# Patient Record
Sex: Male | Born: 1945 | Race: White | Hispanic: No | Marital: Married | Smoking: Never smoker
Health system: Southern US, Community
[De-identification: ages and names within clinical notes are randomized; demographics above are authoritative.]

## PROBLEM LIST (undated history)

## (undated) DIAGNOSIS — J302 Other seasonal allergic rhinitis: Secondary | ICD-10-CM

## (undated) DIAGNOSIS — K219 Gastro-esophageal reflux disease without esophagitis: Secondary | ICD-10-CM

## (undated) DIAGNOSIS — N400 Enlarged prostate without lower urinary tract symptoms: Secondary | ICD-10-CM

## (undated) DIAGNOSIS — D649 Anemia, unspecified: Secondary | ICD-10-CM

## (undated) DIAGNOSIS — Z9889 Other specified postprocedural states: Secondary | ICD-10-CM

## (undated) DIAGNOSIS — C4490 Unspecified malignant neoplasm of skin, unspecified: Secondary | ICD-10-CM

## (undated) DIAGNOSIS — Z86010 Personal history of colonic polyps: Principal | ICD-10-CM

## (undated) HISTORY — PX: BASAL CELL CARCINOMA EXCISION: SHX1214

## (undated) HISTORY — PX: COLONOSCOPY: SHX174

## (undated) HISTORY — DX: Gastro-esophageal reflux disease without esophagitis: K21.9

## (undated) HISTORY — DX: Unspecified malignant neoplasm of skin, unspecified: C44.90

## (undated) HISTORY — DX: Other specified postprocedural states: Z98.890

## (undated) HISTORY — DX: Other seasonal allergic rhinitis: J30.2

## (undated) HISTORY — DX: Personal history of colonic polyps: Z86.010

---

## 1992-05-23 DIAGNOSIS — C4491 Basal cell carcinoma of skin, unspecified: Secondary | ICD-10-CM

## 1992-05-23 HISTORY — DX: Basal cell carcinoma of skin, unspecified: C44.91

## 1993-03-26 HISTORY — PX: OTHER SURGICAL HISTORY: SHX169

## 1993-11-22 DIAGNOSIS — C4492 Squamous cell carcinoma of skin, unspecified: Secondary | ICD-10-CM

## 1993-11-22 DIAGNOSIS — C4491 Basal cell carcinoma of skin, unspecified: Secondary | ICD-10-CM

## 1993-11-22 HISTORY — DX: Squamous cell carcinoma of skin, unspecified: C44.92

## 1993-11-22 HISTORY — DX: Basal cell carcinoma of skin, unspecified: C44.91

## 2002-03-26 DIAGNOSIS — Z9889 Other specified postprocedural states: Secondary | ICD-10-CM

## 2002-03-26 HISTORY — DX: Other specified postprocedural states: Z98.890

## 2003-02-26 ENCOUNTER — Encounter: Payer: Self-pay | Admitting: Emergency Medicine

## 2003-02-27 ENCOUNTER — Observation Stay (HOSPITAL_COMMUNITY): Admission: AD | Admit: 2003-02-27 | Discharge: 2003-02-28 | Payer: Self-pay | Admitting: Cardiology

## 2003-02-27 HISTORY — PX: CARDIAC CATHETERIZATION: SHX172

## 2003-03-09 DIAGNOSIS — C4492 Squamous cell carcinoma of skin, unspecified: Secondary | ICD-10-CM

## 2003-03-09 HISTORY — DX: Squamous cell carcinoma of skin, unspecified: C44.92

## 2003-03-12 ENCOUNTER — Ambulatory Visit (HOSPITAL_COMMUNITY): Admission: RE | Admit: 2003-03-12 | Discharge: 2003-03-12 | Payer: Self-pay | Admitting: Gastroenterology

## 2003-04-07 ENCOUNTER — Ambulatory Visit (HOSPITAL_COMMUNITY): Admission: RE | Admit: 2003-04-07 | Discharge: 2003-04-07 | Payer: Self-pay | Admitting: Cardiology

## 2012-11-26 ENCOUNTER — Encounter: Payer: Self-pay | Admitting: Internal Medicine

## 2012-11-26 ENCOUNTER — Ambulatory Visit (INDEPENDENT_AMBULATORY_CARE_PROVIDER_SITE_OTHER): Payer: 59 | Admitting: Internal Medicine

## 2012-11-26 VITALS — BP 116/80 | HR 63 | Temp 98.4°F | Resp 16 | Ht 67.5 in | Wt 165.6 lb

## 2012-11-26 DIAGNOSIS — Z8249 Family history of ischemic heart disease and other diseases of the circulatory system: Secondary | ICD-10-CM

## 2012-11-26 DIAGNOSIS — E785 Hyperlipidemia, unspecified: Secondary | ICD-10-CM

## 2012-11-26 DIAGNOSIS — Z0000 Encounter for general adult medical examination without abnormal findings: Secondary | ICD-10-CM

## 2012-11-26 DIAGNOSIS — Z23 Encounter for immunization: Secondary | ICD-10-CM

## 2012-11-26 DIAGNOSIS — J45909 Unspecified asthma, uncomplicated: Secondary | ICD-10-CM | POA: Insufficient documentation

## 2012-11-26 LAB — CBC
Hemoglobin: 14.1 g/dL (ref 13.0–17.0)
MCH: 26 pg (ref 26.0–34.0)
Platelets: 276 10*3/uL (ref 150–400)
RBC: 5.43 MIL/uL (ref 4.22–5.81)
WBC: 4.2 10*3/uL (ref 4.0–10.5)

## 2012-11-26 LAB — POCT URINALYSIS DIPSTICK
Bilirubin, UA: NEGATIVE
Blood, UA: NEGATIVE
Leukocytes, UA: NEGATIVE
Protein, UA: NEGATIVE
Spec Grav, UA: 1.015
pH, UA: 5.5

## 2012-11-26 LAB — COMPREHENSIVE METABOLIC PANEL
ALT: 15 U/L (ref 0–53)
Albumin: 4.1 g/dL (ref 3.5–5.2)
CO2: 29 mEq/L (ref 19–32)
Calcium: 9 mg/dL (ref 8.4–10.5)
Chloride: 105 mEq/L (ref 96–112)
Glucose, Bld: 88 mg/dL (ref 70–99)
Sodium: 139 mEq/L (ref 135–145)
Total Bilirubin: 0.5 mg/dL (ref 0.3–1.2)
Total Protein: 6.5 g/dL (ref 6.0–8.3)

## 2012-11-26 LAB — POCT UA - MICROSCOPIC ONLY
Crystals, Ur, HPF, POC: NEGATIVE
Mucus, UA: POSITIVE

## 2012-11-26 LAB — LIPID PANEL
Cholesterol: 207 mg/dL — ABNORMAL HIGH (ref 0–200)
Total CHOL/HDL Ratio: 2.4 Ratio

## 2012-11-26 MED ORDER — AZITHROMYCIN 250 MG PO TABS: ORAL_TABLET | ORAL | Status: DC

## 2012-11-26 MED ORDER — FLUTICASONE-SALMETEROL 100-50 MCG/DOSE IN AEPB: 1.0000 | INHALATION_SPRAY | Freq: Two times a day (BID) | RESPIRATORY_TRACT | Status: DC

## 2012-11-26 MED ORDER — ALBUTEROL SULFATE (2.5 MG/3ML) 0.083% IN NEBU: 2.5000 mg | INHALATION_SOLUTION | Freq: Four times a day (QID) | RESPIRATORY_TRACT | Status: DC | PRN

## 2012-11-26 MED ORDER — FLUTICASONE PROPIONATE 50 MCG/ACT NA SUSP: 2.0000 | Freq: Every day | NASAL | Status: DC

## 2012-11-26 MED ORDER — TRIAZOLAM 0.125 MG PO TABS: 0.1250 mg | ORAL_TABLET | Freq: Every evening | ORAL | Status: DC | PRN

## 2012-11-26 NOTE — Progress Notes (Signed)
Subjective:    Patient ID: Jonathon Taylor, male    DOB: 1945/09/26, 67 y.o.   MRN: [id number]  HPIcpe Doing well No recent meds To [employer] for semester and needs meds to take in case Son 2nd yr colcoll Colon 2004-[facility] Needs Pvax and Zvax and flu ? Small abd wall hern contr with pressure 2-3 yr See w/u hematuria [place] 2012  Review of Systems  Constitutional: Negative.   HENT: Negative.   Eyes: Negative.   Respiratory: Negative.   Cardiovascular: Negative.   Gastrointestinal: Negative.   Endocrine: Negative.   Genitourinary: Negative.   Musculoskeletal: Negative.   Skin: Negative.   Allergic/Immunologic: Negative.   Neurological: Negative.   Hematological: Negative.   Psychiatric/Behavioral: Negative.        Objective:   Physical Exam  Constitutional: He is oriented to person, place, and time. He appears well-developed and well-nourished.  HENT:  Head: Normocephalic.  Right Ear: External ear normal.  Left Ear: External ear normal.  Nose: Nose normal.  Mouth/Throat: Oropharynx is clear and moist.  Tms and canals clear  Eyes: Conjunctivae and EOM are normal. Pupils are equal, round, and reactive to light.  Neck: Normal range of motion. Neck supple. No thyromegaly present.  Cardiovascular: Normal rate, regular rhythm, normal heart sounds and intact distal pulses.   No murmur heard. Pulmonary/Chest: Effort normal and breath sounds normal. No respiratory distress. He has no wheezes. He has no rales.  Abdominal: Soft. Bowel sounds are normal. He exhibits no distension and no mass. There is no tenderness. There is no rebound and no guarding.  No hepatosplenomegaly Small defect LLQ muscle wall w/out herniation  Genitourinary: Rectum normal, prostate normal and penis normal.  Musculoskeletal: Normal range of motion. He exhibits no edema and no tenderness.  Lymphadenopathy:    He has no cervical adenopathy.  Neurological: He is alert and oriented to person, place,  and time. He has normal reflexes. No cranial nerve deficit. He exhibits normal muscle tone. Coordination normal.  Skin: Skin is warm and dry. No rash noted.  Psychiatric: He has a normal mood and affect. His behavior is normal. Judgment and thought content normal.   BP 116/80  Pulse 63  Temp(Src) 98.4 F (36.9 C) (Oral)  Resp 16  Ht 5' 7.5" (1.715 m)  Wt 165 lb 9.6 oz (75.116 kg)  BMI 25.54 kg/m2  SpO2 98%      Assessment & Plan:  Healthy cpe Need for prophylactic vaccination and inoculation against influenza - Plan: Flu Vaccine QUAD 36+ mos IM  Need for prophylactic vaccination against Streptococcus pneumoniae (pneumococcus) - Plan: Pneumococcal polysaccharide vaccine 23-valent greater than or equal to 2yo subcutaneous/IM  Routine general medical examination at a health care facility - Plan: IFOBT POC (occult bld, rslt in office), CBC, Comprehensive metabolic panel, PSA, Lipid panel, POCT UA - Microscopic Only, POCT urinalysis dipstick  Mild hyperlipidemia  Family history of cardiovascular disease  Meds ordered this encounter  Medications  . OVER THE COUNTER MEDICATION    Sig: OTC Ibuprofen prn  . azithromycin (ZITHROMAX) 250 MG tablet    Sig: As packaged    Dispense:  6 tablet    Refill:  0  . azithromycin (ZITHROMAX) 250 MG tablet    Sig: As packaged    Dispense:  6 tablet    Refill:  0    For travel abroad  . fluticasone (FLONASE) 50 MCG/ACT nasal spray    Sig: Place 2 sprays into the nose daily. Dispense 3 for  travel abroad    Dispense:  16 g    Refill:  6  . albuterol (PROVENTIL) (2.5 MG/3ML) 0.083% nebulizer solution    Sig: Take 3 mLs (2.5 mg total) by nebulization every 6 (six) hours as needed for wheezing. Dispense 2 for travel abroad    Dispense:  150 mL    Refill:  5  . triazolam (HALCION) 0.125 MG tablet    Sig: Take 1 tablet (0.125 mg total) by mouth at bedtime as needed. Dispense 90 d supply for travel abroad    Dispense:  30 tablet    Refill:  5  .  Fluticasone-Salmeterol (ADVAIR) 100-50 MCG/DOSE AEPB    Sig: Inhale 1 puff into the lungs 2 (two) times daily. May dispense 3 mo supply if needed for travel    Dispense:  1 each    Refill:  5

## 2012-11-27 LAB — PSA: PSA: 2.95 ng/mL (ref <=4.00)

## 2012-11-28 ENCOUNTER — Telehealth: Payer: Self-pay

## 2012-11-28 MED ORDER — ALBUTEROL SULFATE HFA 108 (90 BASE) MCG/ACT IN AERS: 2.0000 | INHALATION_SPRAY | Freq: Four times a day (QID) | RESPIRATORY_TRACT | Status: DC | PRN

## 2012-11-28 NOTE — Telephone Encounter (Signed)
Pt advised pharmacy that the albuterol Rx was supposed to be the inhaler, not the nebulizer. Dr Merla Riches, please advise or send in Rx for inhaler.

## 2012-11-29 NOTE — Telephone Encounter (Signed)
New Rx sent.

## 2013-12-03 ENCOUNTER — Ambulatory Visit (INDEPENDENT_AMBULATORY_CARE_PROVIDER_SITE_OTHER): Payer: 59 | Admitting: Family Medicine

## 2013-12-03 VITALS — BP 122/80 | HR 67 | Temp 97.9°F | Resp 16 | Ht 68.0 in | Wt 171.0 lb

## 2013-12-03 DIAGNOSIS — B958 Unspecified staphylococcus as the cause of diseases classified elsewhere: Secondary | ICD-10-CM

## 2013-12-03 DIAGNOSIS — L02419 Cutaneous abscess of limb, unspecified: Secondary | ICD-10-CM

## 2013-12-03 DIAGNOSIS — L03116 Cellulitis of left lower limb: Secondary | ICD-10-CM

## 2013-12-03 DIAGNOSIS — L03119 Cellulitis of unspecified part of limb: Secondary | ICD-10-CM

## 2013-12-03 MED ORDER — DOXYCYCLINE HYCLATE 100 MG PO CAPS: 100.0000 mg | ORAL_CAPSULE | Freq: Two times a day (BID) | ORAL | Status: DC

## 2013-12-03 MED ORDER — MUPIROCIN 2 % EX OINT: 1.0000 "application " | TOPICAL_OINTMENT | Freq: Four times a day (QID) | CUTANEOUS | Status: DC

## 2013-12-03 NOTE — Progress Notes (Signed)
Subjective:  This chart was scribed for Delman Cheadle, MD, by Neta Ehlers, ED Scribe. This patient's care was started at  11:27 AM.    Patient ID: Jonathon Taylor, male    DOB: Apr 19, 1945, 68 y.o.   MRN: [id number]  Chief Complaint  Patient presents with  . Left Leg Infection    HPI  Jonathon Taylor is a 68 y.o. male who presents to [facility] complaining of an infection to the lateral aspect of his left lower extremity, onset four days ago. Jonathon Taylor reports the site initially seemed to be an abscess, and he treated the site with Epsom salts which resulted in a yellow-green discharge from the site. He also treated the site with peroxide and a hot compress this morning; he noticed blisters to the site following application of the compresses. The site is associated with swelling and redness. He denies pain or itching with the sites. He also has had similar pustules develop to the left popliteal and the left MCP. He denies similar pustules to inguinal region.  He reports a h/o similar symptoms, though less severe.  Jonathon Taylor reports he had been swimming in the ocean prior to observation of the affected site. He also reports he has recently been hiking, but he does not believe the infection is related to poison ivy.   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   History reviewed. No pertinent past medical history.  Current Outpatient Prescriptions on File Prior to Visit  Medication Sig Dispense Refill  . triazolam (HALCION) 0.125 MG tablet Take 1 tablet (0.125 mg total) by mouth at bedtime as needed. Dispense 90 d supply for travel abroad  30 tablet  5  . albuterol (PROVENTIL HFA;VENTOLIN HFA) 108 (90 BASE) MCG/ACT inhaler Inhale 2 puffs into the lungs every 6 (six) hours as needed for wheezing. Disp 2 for travel  1 Inhaler  5  . azithromycin (ZITHROMAX) 250 MG tablet As packaged  6 tablet  0  . azithromycin (ZITHROMAX) 250 MG tablet As packaged  6 tablet  0  .  fluticasone (FLONASE) 50 MCG/ACT nasal spray Place 2 sprays into the nose daily. Dispense 3 for travel abroad  16 g  6  . Fluticasone-Salmeterol (ADVAIR) 100-50 MCG/DOSE AEPB Inhale 1 puff into the lungs 2 (two) times daily. May dispense 3 mo supply if needed for travel  1 each  5  . OVER THE COUNTER MEDICATION OTC Ibuprofen prn       No current facility-administered medications on file prior to visit.   No Known Allergies   Review of Systems  Constitutional: Negative for fever and chills.  Skin: Positive for color change.   Vitals: BP 122/80  Pulse 67  Temp(Src) 97.9 F (36.6 C) (Oral)  Resp 16  Ht 5\' 8"  (1.727 m)  Wt 171 lb (77.565 kg)  BMI 26.01 kg/m2  SpO2 98%     Objective:   Physical Exam  Nursing note and vitals reviewed. Constitutional: He is oriented to person, place, and time. He appears well-developed and well-nourished. No distress.  HENT:  Head: Normocephalic and atraumatic.  Eyes: Conjunctivae and EOM are normal.  Neck: Neck supple. No tracheal deviation present.  Cardiovascular: Normal rate.   Pulmonary/Chest: Effort normal. No respiratory distress.  Musculoskeletal: Normal range of motion.  Neurological: He is alert and oriented to person, place, and time.  Skin: Skin is warm and dry. There is erythema.  No warmth.  Approximately 3-4 cm serpigenous erythematous  pustule with mild amount of induration, but no fluctuance, with large fluid-filled bulla over.  0.5 cm pustule with central papules noted on left knee and left second MCP joint.   Psychiatric: He has a normal mood and affect. His behavior is normal.      Assessment & Plan:  Will prescribe an oral and topical antibiotic. Advised pt to continue to use warm (not hot) compresses.  Also advised pt to return to the office if the symptoms worsen.  Staph infection - Plan: Wound culture - ADDENDUM: Staph aureas from wound culture NOT antibiotic resistant, complete course of doxy.  Cellulitis of leg,  left  Meds ordered this encounter  Medications  . mupirocin ointment (BACTROBAN) 2 %    Sig: Apply 1 application topically 4 (four) times daily.    Dispense:  30 g    Refill:  3  . doxycycline (VIBRAMYCIN) 100 MG capsule    Sig: Take 1 capsule (100 mg total) by mouth 2 (two) times daily.    Dispense:  28 capsule    Refill:  0    I personally performed the services described in this documentation, which was scribed in my presence. The recorded information has been reviewed and considered, and addended by me as needed.  Delman Cheadle, MD MPH

## 2013-12-03 NOTE — Patient Instructions (Signed)
Cellulitis Cellulitis is an infection of the skin and the tissue beneath it. The infected area is usually red and tender. Cellulitis occurs most often in the arms and lower legs.  CAUSES  Cellulitis is caused by bacteria that enter the skin through cracks or cuts in the skin. The most common types of bacteria that cause cellulitis are staphylococci and streptococci. SIGNS AND SYMPTOMS   Redness and warmth.  Swelling.  Tenderness or pain.  Fever. DIAGNOSIS  Your health care provider can usually determine what is wrong based on a physical exam. Blood tests may also be done. TREATMENT  Treatment usually involves taking an antibiotic medicine. HOME CARE INSTRUCTIONS   Take your antibiotic medicine as directed by your health care provider. Finish the antibiotic even if you start to feel better.  Keep the infected arm or leg elevated to reduce swelling.  Apply a warm cloth to the affected area up to 4 times per day to relieve pain.  Take medicines only as directed by your health care provider.  Keep all follow-up visits as directed by your health care provider. SEEK MEDICAL CARE IF:   You notice red streaks coming from the infected area.  Your red area gets larger or turns dark in color.  Your bone or joint underneath the infected area becomes painful after the skin has healed.  Your infection returns in the same area or another area.  You notice a swollen bump in the infected area.  You develop new symptoms.  You have a fever. SEEK IMMEDIATE MEDICAL CARE IF:   You feel very sleepy.  You develop vomiting or diarrhea.  You have a general ill feeling (malaise) with muscle aches and pains. MAKE SURE YOU:   Understand these instructions.  Will watch your condition.  Will get help right away if you are not doing well or get worse. Document Released: 12/20/2004 Document Revised: 07/27/2013 Document Reviewed: 05/28/2011 [facility] Patient Information 2015 [place], [place].  This information is not intended to replace advice given to you by your health care provider. Make sure you discuss any questions you have with your health care provider.  MRSA Infection MRSA stands for methicillin-resistant Staphylococcus aureus. This type of infection is caused by Staphylococcus aureus bacteria that are no longer affected by the medicines used to kill them (drug resistant). Staphylococcus (staph) bacteria are normally found on the skin or in the nose of healthy people. In most cases, these bacteria do not cause infection. But if these resistant bacteria enter your body through a cut or sore, they can cause a serious infection on your skin or in other parts of your body. There is a slight chance that the staph on your skin or in your nose is MRSA. There are two types of MRSA infections:  Hospital-acquired MRSA is bacteria that you get in the hospital.  Community-acquired MRSA is bacteria that you get somewhere other than in a hospital. RISK FACTORS Hospital-acquired MRSA is more common. You could be at risk for this infection if you are in the hospital and you:  Have surgery or a procedure.  Have an IV access or a catheter tube placed in your body.  Have weak resistance to germs (weakened immune system).  Are elderly.  Are on kidney dialysis. You could be at risk for community-acquired MRSA if you have a break in your skin and come into contact with MRSA. This may happen if you:  Play sports where there is skin-to-skin contact.  Live in a crowded  setting, like a dormitory or a [employer]  Share towels, razors, or sports equipment with other people. SYMPTOMS  Symptoms of hospital-acquired MRSA depend on where MRSA has spread. Symptoms may include:  Wound infection.  Skin infection.  Rash.  Pneumonia.  Fever and chills.  Difficulty breathing.  Chest pain. Community-acquired MRSA is most likely to start as a scratch or cut that becomes infected.  Symptoms may include:  A pus-filled pimple.  A boil on your skin.  Pus draining from your skin.  A sore (abscess) under your skin or somewhere in your body.  Fever with or without chills. DIAGNOSIS  The diagnosis of MRSA is made by taking a sample from an infected area and sending it to a lab for testing. A lab technician can grow (culture) MRSA and check it under a microscope. The cultured MRSA can be tested to see which type of antibiotic medicine will work to treat it. Newer tests can identify MRSA more quickly by testing bacteria samples for MRSA genes. Your health care provider can diagnose MRSA using samples from:   Cuts or wounds in infected areas.  Nasal swabs.  Saliva or cough specimens from deep in the lungs (sputum).  Urine.  Blood. You may also have:  Imaging studies (such as X-ray or MRI) to check if the infection has spread to the lungs, bones, or joints.  A culture and sensitivity test of blood or fluids from inside the joints. TREATMENT  Treatment depends on how severe, deep, or extensive the infection is. Very bad infections may require a hospital stay.  Some skin infections, such as a small boil or sore (abscess), may be treated by draining pus from the site of the infection.  More extensive surgery to drain pus may be necessary for deeper or more widespread soft tissue infections.  You may then have to take antibiotic medicine given by mouth or through a vein. You may start antibiotic treatment right away or after testing can be done to see what antibiotic medicine should be used. HOME CARE INSTRUCTIONS   Take your antibiotics as directed by your health care provider. Take the medicine as prescribed until it is finished.  Avoid close contact with those around you as much as possible. Do not use towels, razors, toothbrushes, bedding, or other items that will be used by others.  Wash your hands frequently for 15 seconds with soap and water. Dry your hands  with a clean or disposable towel.  When you are not able to wash your hands, use hand sanitizer that is more than 60 percent alcohol.  Wash towels, sheets, or clothes in the washing machine with detergent and hot water. Dry them in a hot dryer.  Follow your health care provider's instructions for wound care. Wash your hands before and after changing your bandages.  Always shower after exercising.  Keep all cuts and scrapes clean and covered with a bandage.  Be sure to tell all your health care providers that you have MRSA so they are aware of your infection. SEEK MEDICAL CARE IF:  You have a cut, scrape, pimple, or boil that becomes red, swollen, or painful or has pus in it.  You have pus draining from your skin.  You have an abscess under your skin or somewhere in your body. SEEK IMMEDIATE MEDICAL CARE IF:   You have symptoms of a skin infection with a fever or chills.  You have trouble breathing.  You have chest pain.  You have a  skin wound and you become nauseous or start vomiting. MAKE SURE YOU:  Understand these instructions.  Will watch your condition.  Will get help right away if you are not doing well or get worse. Document Released: 03/12/2005 Document Revised: 03/17/2013 Document Reviewed: 01/02/2013 [facility] Patient Information 2015 [place], [place]. This information is not intended to replace advice given to you by your health care provider. Make sure you discuss any questions you have with your health care provider.

## 2013-12-06 LAB — WOUND CULTURE
GRAM STAIN: NONE SEEN
Gram Stain: NONE SEEN
Gram Stain: NONE SEEN

## 2014-01-17 ENCOUNTER — Ambulatory Visit (INDEPENDENT_AMBULATORY_CARE_PROVIDER_SITE_OTHER): Payer: 59 | Admitting: Internal Medicine

## 2014-01-17 VITALS — BP 108/72 | HR 68 | Temp 98.2°F | Resp 16 | Ht 68.0 in | Wt 173.4 lb

## 2014-01-17 DIAGNOSIS — Z0000 Encounter for general adult medical examination without abnormal findings: Secondary | ICD-10-CM

## 2014-01-17 DIAGNOSIS — Z23 Encounter for immunization: Secondary | ICD-10-CM

## 2014-01-17 DIAGNOSIS — Z8249 Family history of ischemic heart disease and other diseases of the circulatory system: Secondary | ICD-10-CM

## 2014-01-17 DIAGNOSIS — Z125 Encounter for screening for malignant neoplasm of prostate: Secondary | ICD-10-CM

## 2014-01-17 DIAGNOSIS — Z13220 Encounter for screening for lipoid disorders: Secondary | ICD-10-CM

## 2014-01-17 DIAGNOSIS — J4520 Mild intermittent asthma, uncomplicated: Secondary | ICD-10-CM

## 2014-01-17 DIAGNOSIS — G4700 Insomnia, unspecified: Secondary | ICD-10-CM

## 2014-01-17 LAB — COMPREHENSIVE METABOLIC PANEL
ALBUMIN: 4.2 g/dL (ref 3.5–5.2)
ALT: 13 U/L (ref 0–53)
AST: 14 U/L (ref 0–37)
Alkaline Phosphatase: 38 U/L — ABNORMAL LOW (ref 39–117)
BUN: 21 mg/dL (ref 6–23)
CALCIUM: 9.3 mg/dL (ref 8.4–10.5)
CHLORIDE: 104 meq/L (ref 96–112)
CO2: 28 meq/L (ref 19–32)
Creat: 0.85 mg/dL (ref 0.50–1.35)
Glucose, Bld: 85 mg/dL (ref 70–99)
Potassium: 4.4 mEq/L (ref 3.5–5.3)
SODIUM: 138 meq/L (ref 135–145)
Total Bilirubin: 0.4 mg/dL (ref 0.2–1.2)
Total Protein: 6.5 g/dL (ref 6.0–8.3)

## 2014-01-17 LAB — LIPID PANEL
Cholesterol: 216 mg/dL — ABNORMAL HIGH (ref 0–200)
HDL: 84 mg/dL (ref >39)
LDL CALC: 112 mg/dL — AB (ref 0–99)
Total CHOL/HDL Ratio: 2.6 Ratio
Triglycerides: 98 mg/dL (ref <150)
VLDL: 20 mg/dL (ref 0–40)

## 2014-01-17 LAB — POCT URINALYSIS DIPSTICK
Bilirubin, UA: NEGATIVE
Blood, UA: NEGATIVE
Glucose, UA: NEGATIVE
KETONES UA: NEGATIVE
Leukocytes, UA: NEGATIVE
Nitrite, UA: NEGATIVE
PH UA: 6
PROTEIN UA: NEGATIVE
UROBILINOGEN UA: 0.2

## 2014-01-17 LAB — POCT CBC
Granulocyte percent: 53.7 %G (ref 37–80)
HCT, POC: 44.9 % (ref 43.5–53.7)
HEMOGLOBIN: 13.5 g/dL — AB (ref 14.1–18.1)
Lymph, poc: 2.1 (ref 0.6–3.4)
MCH, POC: 26 pg — AB (ref 27–31.2)
MCHC: 30 g/dL — AB (ref 31.8–35.4)
MCV: 86.5 fL (ref 80–97)
MID (cbc): 0.5 (ref 0–0.9)
MPV: 8.1 fL (ref 0–99.8)
PLATELET COUNT, POC: 234 10*3/uL (ref 142–424)
POC Granulocyte: 3 (ref 2–6.9)
POC LYMPH PERCENT: 36.9 %L (ref 10–50)
POC MID %: 9.4 %M (ref 0–12)
RBC: 5.19 M/uL (ref 4.69–6.13)
RDW, POC: 17.8 %
WBC: 5.6 10*3/uL (ref 4.6–10.2)

## 2014-01-17 LAB — TSH: TSH: 0.996 u[IU]/mL (ref 0.350–4.500)

## 2014-01-17 MED ORDER — ZOSTER VACCINE LIVE 19400 UNT/0.65ML ~~LOC~~ SOLR: 0.6500 mL | Freq: Once | SUBCUTANEOUS | Status: DC

## 2014-01-17 MED ORDER — ALBUTEROL SULFATE HFA 108 (90 BASE) MCG/ACT IN AERS: 2.0000 | INHALATION_SPRAY | Freq: Four times a day (QID) | RESPIRATORY_TRACT | Status: DC | PRN

## 2014-01-17 MED ORDER — TRIAZOLAM 0.125 MG PO TABS: 0.1250 mg | ORAL_TABLET | Freq: Every evening | ORAL | Status: DC | PRN

## 2014-01-17 MED ORDER — FLUTICASONE PROPIONATE 50 MCG/ACT NA SUSP: 2.0000 | Freq: Every day | NASAL | Status: DC

## 2014-01-17 MED ORDER — FLUTICASONE-SALMETEROL 100-50 MCG/DOSE IN AEPB: 1.0000 | INHALATION_SPRAY | Freq: Two times a day (BID) | RESPIRATORY_TRACT | Status: DC

## 2014-01-18 ENCOUNTER — Encounter: Payer: Self-pay | Admitting: Internal Medicine

## 2014-01-18 DIAGNOSIS — G4700 Insomnia, unspecified: Secondary | ICD-10-CM | POA: Insufficient documentation

## 2014-01-18 LAB — PSA: PSA: 2.7 ng/mL (ref <=4.00)

## 2014-01-18 NOTE — Progress Notes (Signed)
Subjective:    Patient ID: Jonathon Taylor, male    DOB: [birth year], 68 y.o.   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 No changes in his health status over the past year. Remains very active. Married/college age son  Patient Active Problem List   Diagnosis Date Noted  . Insomnia--- occasional need for Halcion 1 wakes early. Usually related to stress of the academic calendar  01/18/2014  . RAD (reactive airway disease)--responds well to intermittent use of inhaled steroids during allergy season  11/26/2012  . Mild hyperlipidemia 11/26/2012  . 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 Constitutional: He is oriented to person, place, and time. He appears well-developed and well-nourished. No distress.  HENT:  Head: Normocephalic and atraumatic.  Eyes: EOM are normal. Pupils are equal, round, and reactive to light.  Neck: Normal range of motion.  Cardiovascular: Normal rate and regular rhythm.   Pulmonary/Chest: Effort normal. No respiratory distress.  Musculoskeletal: Normal range of motion. He exhibits no edema.  Neurological: He is alert and oriented to person, place, and time. No cranial nerve deficit. Coordination normal.  Skin: Skin is warm and dry.  Psychiatric: He has a normal mood and affect. His behavior is normal.   BP 108/72  Pulse 68  Temp(Src) 98.2 F (36.8 C) (Oral)  Resp 16  Ht 5\' 8"  (1.727 m)  Wt 173 lb 6.4 oz (78.654 kg)  BMI 26.37 kg/m2  SpO2 99%     Assessment & Plan:  Annual exam  Patient Active Problem List   Diagnosis Date Noted  .  Insomnia 01/18/2014  . RAD (reactive airway disease) 11/26/2012  . Mild hyperlipidemia 11/26/2012  . Family history of cardiovascular disease 11/26/2012   Meds ordered this encounter  Medications  . fluticasone (FLONASE) 50 MCG/ACT nasal spray    Sig: Place 2 sprays into both nostrils daily.    Dispense:  16 g    Refill:  11  . triazolam (HALCION) 0.125 MG tablet    Sig: Take 1 tablet (0.125 mg total) by mouth at bedtime as needed.    Dispense:  30 tablet    Refill:  5  . Fluticasone-Salmeterol (ADVAIR) 100-50 MCG/DOSE AEPB    Sig: Inhale 1 puff into the lungs 2 (two) times daily.    Dispense:  1 each    Refill:  11  . albuterol (PROVENTIL HFA;VENTOLIN HFA) 108 (90 BASE) MCG/ACT inhaler    Sig: Inhale 2 puffs into the lungs every 6 (six) hours as needed for wheezing.    Dispense:  1 Inhaler    Refill:  11  . zoster vaccine live, PF, (ZOSTAVAX) 92426 UNT/0.65ML injection    Sig: Inject 19,400 Units into the skin once. Administer at pharmacy    Dispense:  1 each    Refill:  0  Prevnar given/flu shot  Tdap/colonos disc at f/u  Results for orders placed in visit on 01/17/14  COMPREHENSIVE METABOLIC PANEL      Result Value Ref Range   Sodium 138  135 - 145 mEq/L   Potassium 4.4  3.5 -  5.3 mEq/L   Chloride 104  96 - 112 mEq/L   CO2 28  19 - 32 mEq/L   Glucose, Bld 85  70 - 99 mg/dL   BUN 21  6 - 23 mg/dL   Creat 0.85  0.50 - 1.35 mg/dL   Total Bilirubin 0.4  0.2 - 1.2 mg/dL   Alkaline Phosphatase 38 (*) 39 - 117 U/L   AST 14  0 - 37 U/L   ALT 13  0 - 53 U/L   Total Protein 6.5  6.0 - 8.3 g/dL   Albumin 4.2  3.5 - 5.2 g/dL   Calcium 9.3  8.4 - 10.5 mg/dL  PSA      Result Value Ref Range   PSA   pending <=4.00 ng/mL  LIPID PANEL      Result Value Ref Range   Cholesterol 216 (*) 0 - 200 mg/dL   Triglycerides 98  <150 mg/dL   HDL 84  >39 mg/dL   Total CHOL/HDL Ratio 2.6     VLDL 20  0 - 40 mg/dL   LDL Cholesterol 112 (*) 0 - 99 mg/dL  TSH      Result Value Ref Range     TSH 0.996  0.350 - 4.500 uIU/mL  POCT URINALYSIS DIPSTICK      Result Value Ref Range   Color, UA yellow     Clarity, UA clear     Glucose, UA neg     Bilirubin, UA neg     Ketones, UA neg     Spec Grav, UA <=1.005     Blood, UA neg     pH, UA 6.0     Protein, UA neg     Urobilinogen, UA 0.2     Nitrite, UA neg     Leukocytes, UA Negative    POCT CBC      Result Value Ref Range   WBC 5.6  4.6 - 10.2 K/uL   Lymph, poc 2.1  0.6 - 3.4   POC LYMPH PERCENT 36.9  10 - 50 %L   MID (cbc) 0.5  0 - 0.9   POC MID % 9.4  0 - 12 %M   POC Granulocyte 3.0  2 - 6.9   Granulocyte percent 53.7  37 - 80 %G   RBC 5.19  4.69 - 6.13 M/uL   Hemoglobin 13.5 (*) 14.1 - 18.1 g/dL   HCT, POC 44.9  43.5 - 53.7 %   MCV 86.5  80 - 97 fL   MCH, POC 26.0 (*) 27 - 31.2 pg   MCHC 30.0 (*) 31.8 - 35.4 g/dL   RDW, POC 17.8     Platelet Count, POC 234  142 - 424 K/uL   MPV 8.1  0 - 99.8 fL

## 2014-01-18 NOTE — Progress Notes (Signed)
Thanks--nothing else to do.

## 2015-04-20 ENCOUNTER — Telehealth: Payer: Self-pay | Admitting: Family Medicine

## 2015-04-20 NOTE — Telephone Encounter (Signed)
LEFT A MESSAGE FOR PATIENT TO RETURN CALL TO FIND OUT IF HE HAS HAD HIS COLONOSCOPY SINCE 2004 AND IF SO WHERE AND WHEN?  IF NOT MAY WE GET IT SCHEDULED FOR HIM?

## 2015-04-27 ENCOUNTER — Ambulatory Visit (INDEPENDENT_AMBULATORY_CARE_PROVIDER_SITE_OTHER): Payer: BLUE CROSS/BLUE SHIELD | Admitting: Internal Medicine

## 2015-04-27 VITALS — BP 110/69 | HR 82 | Temp 98.1°F | Resp 16 | Ht 68.5 in | Wt 165.0 lb

## 2015-04-27 DIAGNOSIS — N400 Enlarged prostate without lower urinary tract symptoms: Secondary | ICD-10-CM

## 2015-04-27 DIAGNOSIS — G4700 Insomnia, unspecified: Secondary | ICD-10-CM | POA: Diagnosis not present

## 2015-04-27 DIAGNOSIS — J4520 Mild intermittent asthma, uncomplicated: Secondary | ICD-10-CM | POA: Diagnosis not present

## 2015-04-27 DIAGNOSIS — J0100 Acute maxillary sinusitis, unspecified: Secondary | ICD-10-CM | POA: Diagnosis not present

## 2015-04-27 DIAGNOSIS — Z0000 Encounter for general adult medical examination without abnormal findings: Secondary | ICD-10-CM

## 2015-04-27 LAB — CBC WITH DIFFERENTIAL/PLATELET
Basophils Absolute: 0 10*3/uL (ref 0.0–0.1)
Basophils Relative: 1 % (ref 0–1)
EOS PCT: 2 % (ref 0–5)
Eosinophils Absolute: 0.1 10*3/uL (ref 0.0–0.7)
HCT: 43.4 % (ref 39.0–52.0)
Hemoglobin: 14.2 g/dL (ref 13.0–17.0)
Lymphocytes Relative: 33 % (ref 12–46)
Lymphs Abs: 1.4 10*3/uL (ref 0.7–4.0)
MCH: 26.9 pg (ref 26.0–34.0)
MCHC: 32.7 g/dL (ref 30.0–36.0)
MCV: 82.4 fL (ref 78.0–100.0)
MPV: 10 fL (ref 8.6–12.4)
Monocytes Absolute: 0.6 10*3/uL (ref 0.1–1.0)
Monocytes Relative: 14 % — ABNORMAL HIGH (ref 3–12)
Neutro Abs: 2.1 10*3/uL (ref 1.7–7.7)
Neutrophils Relative %: 50 % (ref 43–77)
PLATELETS: 272 10*3/uL (ref 150–400)
RBC: 5.27 MIL/uL (ref 4.22–5.81)
RDW: 15.7 % — AB (ref 11.5–15.5)
WBC: 4.1 10*3/uL (ref 4.0–10.5)

## 2015-04-27 LAB — COMPREHENSIVE METABOLIC PANEL
ALT: 13 U/L (ref 9–46)
AST: 17 U/L (ref 10–35)
Albumin: 4.2 g/dL (ref 3.6–5.1)
Alkaline Phosphatase: 41 U/L (ref 40–115)
BILIRUBIN TOTAL: 0.6 mg/dL (ref 0.2–1.2)
BUN: 16 mg/dL (ref 7–25)
CO2: 26 mmol/L (ref 20–31)
Calcium: 9.1 mg/dL (ref 8.6–10.3)
Chloride: 102 mmol/L (ref 98–110)
Creat: 0.99 mg/dL (ref 0.70–1.25)
GLUCOSE: 92 mg/dL (ref 65–99)
POTASSIUM: 4.2 mmol/L (ref 3.5–5.3)
SODIUM: 136 mmol/L (ref 135–146)
Total Protein: 6.6 g/dL (ref 6.1–8.1)

## 2015-04-27 LAB — IFOBT (OCCULT BLOOD): IFOBT: NEGATIVE

## 2015-04-27 LAB — LIPID PANEL
Cholesterol: 224 mg/dL — ABNORMAL HIGH (ref 125–200)
HDL: 114 mg/dL (ref >=40)
LDL CALC: 102 mg/dL (ref <130)
Total CHOL/HDL Ratio: 2 Ratio (ref <=5.0)
Triglycerides: 41 mg/dL (ref <150)
VLDL: 8 mg/dL (ref <30)

## 2015-04-27 MED ORDER — TRIAZOLAM 0.125 MG PO TABS: 0.1250 mg | ORAL_TABLET | Freq: Every evening | ORAL | Status: DC | PRN

## 2015-04-27 MED ORDER — AMOXICILLIN 875 MG PO TABS: 875.0000 mg | ORAL_TABLET | Freq: Two times a day (BID) | ORAL | Status: DC

## 2015-04-27 MED ORDER — FLUTICASONE-SALMETEROL 100-50 MCG/DOSE IN AEPB: 1.0000 | INHALATION_SPRAY | Freq: Two times a day (BID) | RESPIRATORY_TRACT | Status: DC

## 2015-04-27 MED ORDER — FLUTICASONE PROPIONATE 50 MCG/ACT NA SUSP: 2.0000 | Freq: Every day | NASAL | Status: DC

## 2015-04-27 MED ORDER — ALBUTEROL SULFATE HFA 108 (90 BASE) MCG/ACT IN AERS: 2.0000 | INHALATION_SPRAY | Freq: Four times a day (QID) | RESPIRATORY_TRACT | Status: DC | PRN

## 2015-04-27 MED ORDER — PREDNISONE 20 MG PO TABS: ORAL_TABLET | ORAL | Status: DC

## 2015-04-27 NOTE — Patient Instructions (Signed)
zyflamend for prostate health 

## 2015-04-27 NOTE — Progress Notes (Signed)
Subjective:    Patient ID: Jonathon Taylor, male    DOB: 01/11/1946, 70 y.o.   MRN: [id number]  HPI annual physical Patient Active Problem List   Diagnosis Date Noted  . 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 RAD (reactive airway disease)--- He developed viral URI again this winter one month ago and it has progressed to purulent sinus drainage with reactive wheezing at times as has happened so years in the past. This never happens in spring summer or fall. He has minimal allergies controlled on Flonase. He uses occasional Advair and Ventolin  11/26/2012  . Mild hyperlipidemia with no other risk factors 11/26/2012  . Family history of cardiovascular disease-- sister ICD stable for years// 11/26/2012    extremely healthy diet  Very good exercise habits  Health maintenance issues up-to-date [employer] Gretta [employer]ried [employer] physics  He has elected to avoid colonoscopy #2 and to do yearly blood stool samples His insurance company provided cologuard last year or the year before   Review of Systems -continued nocturia 1 or 2 -Occasional back pain after taking in the garden managed with exercises  Otherwise 14 point review of systems negative    Continues dermatology follow-up for sun related issues    Objective:   Physical Exam  Constitutional: He is oriented to person, place, and time. He appears well-developed and well-nourished.  HENT:  Head: Normocephalic and atraumatic.  Right Ear: Hearing, tympanic membrane, external ear and ear canal normal.  Left Ear: Hearing, tympanic membrane, external ear and ear canal normal.  Mouth/Throat: Uvula is midline, oropharynx is clear and moist and mucous membranes are normal.  By turbinates with slight purulence on the left  Eyes: Conjunctivae, EOM and  lids are normal. Pupils are equal, round, and reactive to light. Right eye exhibits no discharge. Left eye exhibits no discharge. No scleral icterus.  Neck: Trachea normal and normal range of motion. Neck supple. Carotid bruit is not present.  Cardiovascular: Normal rate, regular rhythm, normal heart sounds, intact distal pulses and normal pulses.   No murmur heard. Pulmonary/Chest: Effort normal. No respiratory distress. He has wheezes. He has no rhonchi. He has no rales.  Wheezing anteriorly on forced expiration  Abdominal: Soft. Normal appearance and bowel sounds are normal. He exhibits no abdominal bruit. There is no tenderness.  Genitourinary:  Prostate soft and symmetrical Not appreciably enlarged. No nodules. Hemeossure  Musculoskeletal: Normal range of motion. He exhibits no edema or tenderness.  Lymphadenopathy:       Head (right side): No submental, no submandibular, no tonsillar, no preauricular, no posterior auricular and no occipital adenopathy present.       Head (left side): No submental, no submandibular, no tonsillar, no preauricular, no posterior auricular and no occipital adenopathy present.    He has no cervical adenopathy.  Neurological: He is alert and oriented to person, place, and time. He has normal strength and normal reflexes. No cranial nerve deficit or sensory deficit. Coordination and gait normal.  Skin: Skin is warm, dry and intact. No lesion and no rash noted.  Psychiatric: He has a normal mood and affect. His speech is normal and behavior is normal. Judgment and thought content normal.    Results for orders placed or performed in visit on 04/27/15  CBC with  Differential/Platelet  Result Value Ref Range   WBC 4.1 4.0 - 10.5 K/uL   RBC 5.27 4.22 - 5.81 MIL/uL   Hemoglobin 14.2 13.0 - 17.0 g/dL   HCT 43.4 39.0 - 52.0 %   MCV 82.4 78.0 - 100.0 fL   MCH 26.9 26.0 - 34.0 pg   MCHC 32.7 30.0 - 36.0 g/dL   RDW 15.7 (H) 11.5 - 15.5 %   Platelets 272 150 - 400  K/uL   MPV 10.0 8.6 - 12.4 fL   Neutrophils Relative % 50 43 - 77 %   Neutro Abs 2.1 1.7 - 7.7 K/uL   Lymphocytes Relative 33 12 - 46 %   Lymphs Abs 1.4 0.7 - 4.0 K/uL   Monocytes Relative 14 (H) 3 - 12 %   Monocytes Absolute 0.6 0.1 - 1.0 K/uL   Eosinophils Relative 2 0 - 5 %   Eosinophils Absolute 0.1 0.0 - 0.7 K/uL   Basophils Relative 1 0 - 1 %   Basophils Absolute 0.0 0.0 - 0.1 K/uL   Smear Review Criteria for review not met   Comprehensive metabolic panel  Lipid panel  PSA  IFOBT POC (occult bld, rslt in office)  Result Value Ref Range   IFOBT Negative        Assessment & Plan:  Annual physical exam - Plan: IFOBT POC (occult bld, rslt in office), CBC with Differential/Platelet, Comprehensive metabolic panel, Lipid panel, PSA  Insomnia  Reactive airway disease, mild intermittent, uncomplicated  Acute maxillary sinusitis, recurrence not specified  BPH (benign prostatic hyperplasia)   Meds ordered this encounter  Medications  . predniSONE (DELTASONE) 20 MG tablet    Sig: 4/3/3/2/2/1/1 single daily dose for 7 days    Dispense:  16 tablet    Refill:  0  . amoxicillin (AMOXIL) 875 MG tablet    Sig: Take 1 tablet (875 mg total) by mouth 2 (two) times daily.    Dispense:  20 tablet    Refill:  0  . triazolam (HALCION) 0.125 MG tablet    Sig: Take 1 tablet (0.125 mg total) by mouth at bedtime as needed.    Dispense:  30 tablet    Refill:  5  . Fluticasone-Salmeterol (ADVAIR) 100-50 MCG/DOSE AEPB    Sig: Inhale 1 puff into the lungs 2 (two) times daily.    Dispense:  1 each    Refill:  11  . fluticasone (FLONASE) 50 MCG/ACT nasal spray    Sig: Place 2 sprays into both nostrils daily.    Dispense:  16 g    Refill:  11  . albuterol (PROVENTIL HFA;VENTOLIN HFA) 108 (90 Base) MCG/ACT inhaler    Sig: Inhale 2 puffs into the lungs every 6 (six) hours as needed for wheezing.    Dispense:  1 Inhaler    Refill:  11

## 2015-04-28 LAB — PSA: PSA: 3.68 ng/mL (ref <=4.00)

## 2015-05-10 ENCOUNTER — Telehealth: Payer: Self-pay | Admitting: Internal Medicine

## 2015-05-10 NOTE — Telephone Encounter (Signed)
Called and lmom for pt to call back.  I have his cologuard form at 104 per Dr. Laney Pastor.  Asked pt to call me back to let me know if he wants to come by and sign this or if he wants this mailed to him and he can sign it and mail it back.  Waiting on call back from pt.

## 2015-05-18 ENCOUNTER — Encounter: Payer: Self-pay | Admitting: *Deleted

## 2015-05-18 NOTE — Telephone Encounter (Signed)
Letter and cologuard form has been mailed to the patient to be singed and he was instructed to mail this back to our office.

## 2015-06-01 ENCOUNTER — Other Ambulatory Visit: Payer: Self-pay

## 2015-06-01 NOTE — Telephone Encounter (Signed)
Patient sent in a letter stating Rew insurance treats triazolam (HALCION) 0.125 MG tablet [id number] as a non-generic prescription which cost $60.00 co-pay.   However they treat Triazolam .25 mg tablet as a generic with a $10 copay.  Patient would like to know if he could get 5 refill prescription for Triazolam .25 mg tablet and have the pharmacy replace or cancel the last prescription.   Patient wanted [place] to mail the new Triazolam .25 mg tablet prescription to him, so he can take it to the pharmacy to make sure they have cancelled the other refills.    Please call the patient to let him know what can be done or if a new refill is being sent to the pharmacy.  Pt # [phone]

## 2015-06-02 MED ORDER — TRIAZOLAM 0.25 MG PO TABS: 0.1250 mg | ORAL_TABLET | Freq: Every evening | ORAL | Status: DC | PRN

## 2015-06-02 NOTE — Telephone Encounter (Signed)
Dr Laney Pastor, you wrote Rx for the 0.125 mg 2/1 #30 w/ 5 RFs. If you want to send in a new Rx for the 0.25, take 1/2 tab (?) I will call pharm and cancel the remaining RFs of the 0.125. Pended.

## 2015-06-03 NOTE — Telephone Encounter (Signed)
Spoke to pt and advised I can call in his new Rx and cancel remaining RFs. He asked me to call new Rx to [facility] and cancel old Rx RFs at [facility]. Done.

## 2015-07-01 ENCOUNTER — Telehealth: Payer: Self-pay

## 2015-07-01 DIAGNOSIS — Z9189 Other specified personal risk factors, not elsewhere classified: Secondary | ICD-10-CM

## 2015-07-01 NOTE — Telephone Encounter (Signed)
Ryan from [employer] is calling to see if we received a fax for abnormal cologuard results. Please look out for fax!

## 2015-07-02 NOTE — Telephone Encounter (Signed)
cologuard positive so will refer for colonos

## 2015-07-11 ENCOUNTER — Telehealth: Payer: Self-pay

## 2015-07-11 NOTE — Telephone Encounter (Signed)
Patient wants to know if we have records of the last colonoscopy he had done. I wasn't able to tell in Epic. Please call! [phone]

## 2015-07-18 ENCOUNTER — Encounter: Payer: Self-pay | Admitting: Internal Medicine

## 2015-07-25 ENCOUNTER — Ambulatory Visit (AMBULATORY_SURGERY_CENTER): Payer: Self-pay

## 2015-07-25 VITALS — Ht 68.5 in | Wt 170.0 lb

## 2015-07-25 DIAGNOSIS — Z1211 Encounter for screening for malignant neoplasm of colon: Secondary | ICD-10-CM

## 2015-07-25 NOTE — Progress Notes (Signed)
No allergies to eggs or soy No past problem with anesthesia No home oxygen No diet meds  Has email and internet; registered for emmi

## 2015-07-26 ENCOUNTER — Encounter: Payer: Self-pay | Admitting: Internal Medicine

## 2015-07-27 NOTE — Telephone Encounter (Signed)
Do not see any records in Epic.  Called patient to let him know.

## 2015-08-08 ENCOUNTER — Encounter: Payer: Self-pay | Admitting: Internal Medicine

## 2015-08-08 ENCOUNTER — Ambulatory Visit (AMBULATORY_SURGERY_CENTER): Payer: BLUE CROSS/BLUE SHIELD | Admitting: Internal Medicine

## 2015-08-08 VITALS — BP 120/69 | HR 55 | Temp 99.6°F | Resp 15 | Ht 68.5 in | Wt 170.0 lb

## 2015-08-08 DIAGNOSIS — D122 Benign neoplasm of ascending colon: Secondary | ICD-10-CM

## 2015-08-08 DIAGNOSIS — D123 Benign neoplasm of transverse colon: Secondary | ICD-10-CM | POA: Diagnosis not present

## 2015-08-08 DIAGNOSIS — R195 Other fecal abnormalities: Secondary | ICD-10-CM | POA: Diagnosis present

## 2015-08-08 DIAGNOSIS — D120 Benign neoplasm of cecum: Secondary | ICD-10-CM | POA: Diagnosis not present

## 2015-08-08 MED ORDER — SODIUM CHLORIDE 0.9 % IV SOLN: 500.0000 mL | INTRAVENOUS | Status: DC

## 2015-08-08 NOTE — Op Note (Signed)
[facility] Patient Name: Jonathon Taylor Procedure Date: 08/08/2015 8:24 AM MRN: [id number] Endoscopist: Gatha Mayer , MD Age: 70 Referring MD:  Date of Birth: [birth year] Gender: Male Procedure:                Colonoscopy Indications:              Evaluation of unexplained GI bleeding, Positive                            Cologuard test Medicines:                Propofol per Anesthesia, Monitored Anesthesia Care Procedure:                Pre-Anesthesia Assessment:                           - Prior to the procedure, a History and Physical                            was performed, and patient medications and                            allergies were reviewed. The patient's tolerance of                            previous anesthesia was also reviewed. The risks                            and benefits of the procedure and the sedation                            options and risks were discussed with the patient.                            All questions were answered, and informed consent                            was obtained. Prior Anticoagulants: The patient has                            taken no previous anticoagulant or antiplatelet                            agents. ASA Grade Assessment: II - A patient with                            mild systemic disease. After reviewing the risks                            and benefits, the patient was deemed in                            satisfactory condition to undergo the procedure.  After obtaining informed consent, the colonoscope                            was passed under direct vision. Throughout the                            procedure, the patient's blood pressure, pulse, and                            oxygen saturations were monitored continuously. The                            Model CF-HQ190L [id number]) scope was introduced                            through the anus and advanced to the the  cecum,                            identified by appendiceal orifice and ileocecal                            valve. The colonoscopy was performed without                            difficulty. The patient tolerated the procedure                            well. The quality of the bowel preparation was                            excellent. The bowel preparation used was Miralax.                            The ileocecal valve, appendiceal orifice, and                            rectum were photographed. Scope In: 8:41:09 AM Scope Out: 9:05:31 AM Scope Withdrawal Time: 0 hours 22 minutes 29 seconds  Total Procedure Duration: 0 hours 24 minutes 22 seconds  Findings:                 Six sessile polyps were found in the transverse                            colon, ascending colon and cecum. The polyps were 3                            to 10 mm in size. These polyps were removed with a                            cold snare. Resection and retrieval were complete.                            Verification of patient identification  for the                            specimen was done. Estimated blood loss was minimal.                           Multiple diverticula were found in the sigmoid                            colon. There was no evidence of diverticular                            bleeding.                           The perianal and digital rectal examinations were                            normal.                           The exam was otherwise without abnormality on                            direct and retroflexion views. Complications:            No immediate complications. Estimated Blood Loss:     Estimated blood loss was minimal. Impression:               - Six 3 to 10 mm polyps in the transverse colon, in                            the ascending colon and in the cecum, removed with                            a cold snare. Resected and retrieved.                           - Moderate  diverticulosis in the sigmoid colon.                            There was no evidence of diverticular bleeding.                           - The examination was otherwise normal on direct                            and retroflexion views. Recommendation:           - Patient has a contact number available for                            emergencies. The signs and symptoms of potential                            delayed complications were discussed with the  patient. Return to normal activities tomorrow.                            Written discharge instructions were provided to the                            patient.                           - Resume previous diet.                           - Continue present medications.                           - Repeat colonoscopy is recommended. The                            colonoscopy date will be determined after pathology                            results from today's exam become available for                            review. Gatha Mayer, MD 08/08/2015 9:23:19 AM This report has been signed electronically. CC Letter to:             Marko Stai, MD

## 2015-08-08 NOTE — Progress Notes (Signed)
Stable to RR 

## 2015-08-08 NOTE — Progress Notes (Signed)
Called to room to assist during endoscopic procedure.  Patient ID and intended procedure confirmed with present staff. Received instructions for my participation in the procedure from the performing physician.  

## 2015-08-08 NOTE — Patient Instructions (Addendum)
I found and removed 6 polyps - all look benign. I will let you know pathology results and when to have another routine colonoscopy by mail.  You also have a condition called diverticulosis - common and not usually a problem. Please read the handout provided.  I appreciate the opportunity to care for you. Gatha Mayer, MD, FACG  YOU HAD AN ENDOSCOPIC PROCEDURE TODAY AT [facility]:   Refer to the procedure report that was given to you for any specific questions about what was found during the examination.  If the procedure report does not answer your questions, please call your gastroenterologist to clarify.  If you requested that your care partner not be given the details of your procedure findings, then the procedure report has been included in a sealed envelope for you to review at your convenience later.  YOU SHOULD EXPECT: Some feelings of bloating in the abdomen. Passage of more gas than usual.  Walking can help get rid of the air that was put into your GI tract during the procedure and reduce the bloating. If you had a lower endoscopy (such as a colonoscopy or flexible sigmoidoscopy) you may notice spotting of blood in your stool or on the toilet paper. If you underwent a bowel prep for your procedure, you may not have a normal bowel movement for a few days.  Please Note:  You might notice some irritation and congestion in your nose or some drainage.  This is from the oxygen used during your procedure.  There is no need for concern and it should clear up in a day or so.  SYMPTOMS TO REPORT IMMEDIATELY:   Following lower endoscopy (colonoscopy or flexible sigmoidoscopy):  Excessive amounts of blood in the stool  Significant tenderness or worsening of abdominal pains  Swelling of the abdomen that is new, acute  Fever of 100F or higher   For urgent or emergent issues, a gastroenterologist can be reached at any hour by calling [phone].   DIET: Your  first meal following the procedure should be a small meal and then it is ok to progress to your normal diet. Heavy or fried foods are harder to digest and may make you feel nauseous or bloated.  Likewise, meals heavy in dairy and vegetables can increase bloating.  Drink plenty of fluids but you should avoid alcoholic beverages for 24 hours.  ACTIVITY:  You should plan to take it easy for the rest of today and you should NOT DRIVE or use heavy machinery until tomorrow (because of the sedation medicines used during the test).    FOLLOW UP: Our staff will call the number listed on your records the next business day following your procedure to check on you and address any questions or concerns that you may have regarding the information given to you following your procedure. If we do not reach you, we will leave a message.  However, if you are feeling well and you are not experiencing any problems, there is no need to return our call.  We will assume that you have returned to your regular daily activities without incident.  If any biopsies were taken you will be contacted by phone or by letter within the next 1-3 weeks.  Please call us at [phone] if you have not heard about the biopsies in 3 weeks.    SIGNATURES/CONFIDENTIALITY: You and/or your care partner have signed paperwork which will be entered into your electronic medical record.  These  signatures attest to the fact that that the information above on your After Visit Summary has been reviewed and is understood.  Full responsibility of the confidentiality of this discharge information lies with you and/or your care-partner.  Polyps, diverticulosis-handouts given  Repeat colonoscopy will be determined by pathology.

## 2015-08-09 ENCOUNTER — Telehealth: Payer: Self-pay | Admitting: *Deleted

## 2015-08-09 NOTE — Telephone Encounter (Signed)
  Follow up Call-  Call back number 08/08/2015  Post procedure Call Back phone  # [phone]  Permission to leave phone message Yes     Patient questions:  Do you have a fever, pain , or abdominal swelling? No. Pain Score  0 *  Have you tolerated food without any problems? Yes.    Have you been able to return to your normal activities? Yes.    Do you have any questions about your discharge instructions: Diet   No. Medications  No. Follow up visit  No.  Do you have questions or concerns about your Care? No.  Actions: * If pain score is 4 or above: No action needed, pain <4.

## 2015-08-12 ENCOUNTER — Encounter: Payer: Self-pay | Admitting: Internal Medicine

## 2015-08-12 DIAGNOSIS — Z860100 Personal history of colon polyps, unspecified: Secondary | ICD-10-CM

## 2015-08-12 HISTORY — DX: Personal history of colonic polyps: Z86.010

## 2015-08-12 HISTORY — DX: Personal history of colon polyps, unspecified: Z86.0100

## 2015-08-12 NOTE — Progress Notes (Signed)
Quick Note:  4 of 6 polyps ssp/a - 2 were mucosal polyps Recall 2020 ______

## 2015-08-29 ENCOUNTER — Ambulatory Visit (INDEPENDENT_AMBULATORY_CARE_PROVIDER_SITE_OTHER): Payer: BLUE CROSS/BLUE SHIELD

## 2015-08-29 ENCOUNTER — Ambulatory Visit (INDEPENDENT_AMBULATORY_CARE_PROVIDER_SITE_OTHER): Payer: BLUE CROSS/BLUE SHIELD | Admitting: Family Medicine

## 2015-08-29 ENCOUNTER — Encounter: Payer: Self-pay | Admitting: Family Medicine

## 2015-08-29 ENCOUNTER — Other Ambulatory Visit: Payer: Self-pay | Admitting: Family Medicine

## 2015-08-29 VITALS — BP 135/80 | HR 69 | Wt 165.0 lb

## 2015-08-29 DIAGNOSIS — M5441 Lumbago with sciatica, right side: Secondary | ICD-10-CM

## 2015-08-29 DIAGNOSIS — M47816 Spondylosis without myelopathy or radiculopathy, lumbar region: Secondary | ICD-10-CM | POA: Diagnosis not present

## 2015-08-29 DIAGNOSIS — M4317 Spondylolisthesis, lumbosacral region: Secondary | ICD-10-CM

## 2015-08-29 DIAGNOSIS — M7071 Other bursitis of hip, right hip: Secondary | ICD-10-CM

## 2015-08-29 DIAGNOSIS — M4310 Spondylolisthesis, site unspecified: Secondary | ICD-10-CM | POA: Insufficient documentation

## 2015-08-29 NOTE — Patient Instructions (Signed)
Thank you for coming in today. Get xray.  Attend PT.   Spondylolisthesis With Rehab The slipping of one or multiple vertebrae out of the correct anatomical position is a condition known as spondylolisthesis. Spondylolisthesis is most common in adolescents and is caused by a number of different reasons, such as vertebral fracture or something you are born with (congenital). Spondylolisthesis is diagnosed with the use of X-rays. SYMPTOMS   Dull, achy pain in the lower back.  Pain that worsens with extension of the spine.  Tightness of the muscles on the back of the thigh.  Lower back stiffness.  Signs of nerve damage: pain, numbness, or weakness affecting one or both lower extremities.  Muscle wasting (atrophy), uncommon.  Loss of stool (bowel) or urine (bladder) function. CAUSES  The symptoms of spondylolisthesis are caused by one or more vertebrae that are out of alignment, placing pressure on the spinal cord. Common mechanisms of injury include:  Congenital defect of the spine.  Degenerative process.  Stress fracture of the spine.  Fracture due to trauma to the spine. RISK INCREASES WITH:  Activities that have a risk of hyperextending the back.  Activities that have a risk of excessively rotating the spine.  Poor strength and flexibility.  Failure to warm up properly before activity.  Family history of spondylolysis or spondylolisthesis.  Improper sports technique. PREVENTION  Warm up and stretch properly before activity.  Allow for adequate recovery between workouts.  Maintain physical fitness:  Strength, flexibility, and endurance.  Cardiovascular fitness.  Learn and use proper technique. When possible, have a coach correct improper technique. PROGNOSIS  If treated properly, the spondylolisthesis usually resolves. RELATED COMPLICATIONS   Recurrent symptoms that result in a chronic problem.  Inability to compete in athletics.  Prolonged healing time,  if improperly treated or reinjured.  Failure of the fracture to heal (nonunion).  Healing of the fracture in a poor position (malunion). TREATMENT Treatment initially involves resting from any activities that aggravate the symptoms and the use of ice and medications to help reduce pain and inflammation. The use of strengthening and stretching exercises may help reduce pain with activity. These exercises may be performed at home or with referral to a therapist. It is important to learn how to use proper body mechanics as to not place undue stress on your spine. If the injury is severe, then your caregiver may recommend a back brace to allow for healing, or even surgery. Surgery often involves fusing two adjacent vertebrae so no movement is allowed between them.  MEDICATION   If pain medication is necessary, then nonsteroidal anti-inflammatory medications, such as aspirin and ibuprofen, or other minor pain relievers, such as acetaminophen, are often recommended.  Do not take pain medication for 7 days before surgery.  Prescription pain relievers may be given if deemed necessary by your caregiver. Use only as directed and only as much as you need. HEAT AND COLD  Cold treatment (icing) relieves pain and reduces inflammation. Cold treatment should be applied for 10 to 15 minutes every 2 to 3 hours for inflammation and pain and immediately after any activity that aggravates your symptoms. Use ice packs or massage the area with a piece of ice (ice massage).  Heat treatment may be used prior to performing the stretching and strengthening activities prescribed by your caregiver, physical therapist, or athletic trainer. Use a heat pack or soak the injury in warm water. SEEK MEDICAL CARE IF:  Treatment seems to offer no benefit, or the condition  worsens.  Any medications produce adverse side effects.  Any complications from surgery occur:  Pain, numbness, or coldness in the extremity operated  upon.  Discoloration of the nail beds (they become blue or gray) of the extremity operated upon.  Signs of infections (fever, pain, inflammation, redness, or persistent bleeding). EXERCISES RANGE OF MOTION (ROM) AND STRETCHING EXERCISES - Spondylolisthesis Most people with low back pain will find that their symptoms worsen with either excessive bending forward (flexion) or arching at the low back (extension). The exercises which will help resolve your symptoms will focus on the opposite motion. Your physician, physical therapist or athletic trainer will help you determine which exercises will be most helpful to resolve your low back pain. Do not complete any exercises without first consulting with your clinician. Discontinue any exercises which worsen your symptoms until you speak to your clinician. If you have pain, numbness or tingling which travels down into your buttocks, leg, or foot, the goal of the therapy is for these symptoms to move closer to your back and eventually resolve. Occasionally, these leg symptoms will get better, but your low back pain may worsen; this is typically an indication of progress in your rehabilitation. Be certain to be very alert to any changes in your symptoms and the activities in which you participated in the 24 hours prior to the change. Sharing this information with your clinician will allow him/her to most efficiently treat your condition. These exercises may help you when beginning to rehabilitate your injury. Your symptoms may resolve with or without further involvement from your physician, physical therapist or athletic trainer. While completing these exercises, remember:   Restoring tissue flexibility helps normal motion to return to the joints. This allows healthier, less painful movement and activity.  An effective stretch should be held for at least 30 seconds.  A stretch should never be painful. You should only feel a gentle lengthening or release in the  stretched tissue. FLEXION RANGE OF MOTION AND STRETCHING EXERCISES: STRETCH - Flexion, Single Knee to Chest  Lie on a firm bed or floor with both legs extended in front of you.  Keeping one leg in contact with the floor, bring your opposite knee to your chest. Hold your leg in place by either grabbing behind your thigh or at your knee.  Pull until you feel a gentle stretch in your low back. Hold __________ seconds. Slowly release your grasp and repeat the exercise with the opposite side. Repeat __________ times. Complete this exercise __________ times per day.  STRETCH - Flexion, Double Knee to Chest  Lie on a firm bed or floor with both legs extended in front of you.  Keeping one leg in contact with the floor, bring your opposite knee to your chest.  Tense your stomach muscles to support your back and then lift your other knee to your chest. Hold your legs in place by either grabbing behind your thighs or at your knees.  Pull both knees toward your chest until you feel a gentle stretch in your low back. Hold __________ seconds.  Tense your stomach muscles and slowly return one leg at a time to the floor. Repeat __________ times. Complete this exercise __________ times per day.  STRENGTHENING EXERCISES - Spondylolisthesis These exercises may help you when beginning to rehabilitate your injury. These exercises should be done near your "sweet spot." This is the neutral, low-back arch, somewhere between fully rounded and fully arched, that is your least painful position. When performed in this  safe range of motion, these exercises can be used for people who have either a flexion or extension based injury. These exercises may resolve your symptoms with or without further involvement from your physician, physical therapist or athletic trainer. While completing these exercises, remember:   Muscles can gain both the endurance and the strength needed for everyday activities through controlled  exercises.  Complete these exercises as instructed by your physician, physical therapist or athletic trainer. Progress the resistance and repetitions only as guided.  You may experience muscle soreness or fatigue, but the pain or discomfort you are trying to eliminate should never worsen during these exercises. If this pain does worsen, stop and make certain you are following the directions exactly. If the pain is still present after adjustments, discontinue the exercise until you can discuss the trouble with your clinician. STRENGTHENING - Deep Abdominals, Pelvic Tilt   Lie on a firm bed or floor. Keeping your legs in front of you, bend your knees so they are both pointed toward the ceiling and your feet are flat on the floor.  Tense your lower abdominal muscles to press your low back into the floor. This motion will rotate your pelvis so that your tail bone is scooping upwards rather than pointing at your feet or into the floor.  With a gentle tension and even breathing, hold this position for __________ seconds. Repeat __________ times. Complete this exercise __________ times per day.  STRENGTHENING - Abdominals, Crunches   Lie on a firm bed or floor. Keeping your legs in front of you, bend your knees so they are both pointed toward the ceiling and your feet are flat on the floor. Cross your arms over your chest.  Slightly tip your chin down without bending your neck.  Tense your abdominals and slowly lift your trunk high enough to just clear your shoulder blades. Lifting higher can put excessive stress on the low back and does not further strengthen your abdominal muscles.  Control your return to the starting position. Repeat __________ times. Complete this exercise __________ times per day.  STRENGTHENING - Quadruped, Opposite UE/LE Lift   Assume a hands and knees position on a firm surface. Keep your hands under your shoulders and your knees under your hips. You may place padding under  your knees for comfort.  Find your neutral spine and gently tense your abdominal muscles so that you can maintain this position. Your shoulders and hips should form a rectangle that is parallel with the floor and is not twisted.  Keeping your trunk steady, lift your right hand no higher than your shoulder and then your left leg no higher than your hip. Make sure you are not holding your breath. Hold this position __________ seconds.  Continuing to keep your abdominal muscles tense and your back steady, slowly return to your starting position. Repeat with the opposite arm and leg. Repeat __________ times. Complete this exercise __________ times per day.  STRENGTHENING - Lower Abdominals, Double Knee Lift  Lie on a firm bed or floor. Keeping your legs in front of you, bend your knees so they are both pointed toward the ceiling and your feet are flat on the floor.  Tense your abdominal muscles to brace your low back and slowly lift both of your knees until they come over your hips. Be certain not to hold your breath.  Hold __________ seconds. Using your abdominal muscles, return to the starting position in a slow and controlled manner. Repeat __________ times. Complete  this exercise __________ times per day.  POSTURE AND BODY MECHANICS CONSIDERATIONS - Spondylolisthesis Keeping correct posture when sitting, standing or completing your activities will reduce the stress put on different body tissues, allowing injured tissues a chance to heal and limiting painful experiences. The following are general guidelines for improved posture. Your physician or physical therapist will provide you with any instructions specific to your needs. While reading these guidelines, remember:  The exercises prescribed by your provider will help you have the flexibility and strength to maintain correct postures.  The correct posture provides the optimal environment for your joints to work. All of your joints have less  wear and tear when properly supported by a spine with good posture. This means you will experience a healthier, less painful body.  Correct posture must be practiced with all of your activities, especially prolonged sitting and standing. Correct posture is as important when doing repetitive low-stress activities (typing) as it is when doing a single heavy-load activity (lifting). PROPER SITTING POSTURE In order to minimize stress and discomfort on your spine, you must sit with correct posture. Sitting with good posture should be effortless for a healthy body. Returning to good posture is a gradual process. Many people can work toward this most comfortably by using various supports until they have the flexibility and strength to maintain this posture on their own. When sitting with proper posture, your ears will fall over your shoulders and your shoulders will fall over your hips. You should use the back of the chair to support your upper back. Your low back will be in a neutral position, just slightly arched. You may place a small pillow or folded towel at the base of your low back for  support.  When working at a desk, create an environment that supports good, upright posture. Without extra support, muscles fatigue and lead to excessive strain on joints and other tissues. Keep these recommendations in mind: CHAIR:  A chair should be able to slide under your desk when your back makes contact with the back of the chair. This allows you to work closely.  The chair's height should allow your eyes to be level with the upper part of your monitor and your hands to be slightly lower than your elbows. BODY POSITION  Your feet should make contact with the floor. If this is not possible, use a foot rest.  Keep your ears over your shoulders. This will reduce stress on your neck and low back. INCORRECT SITTING POSTURES If you are feeling tired and unable to assume a healthy sitting posture, do not slouch or  slump. This puts excessive strain on your back tissues, causing more damage and pain. Healthier options include:  Using more support, like a lumbar pillow.  Switching tasks to something that requires you to be upright or walking.  Taking a brief walk.  Lying down to rest in a neutral-spine position.  CORRECT LIFTING TECHNIQUES DO:   Assume a wide stance. This will provide you more stability and the opportunity to get as close as possible to the object which you are lifting.  Tense your abdominals to brace your spine; then bend at the knees and hips. Keeping your back locked in a neutral-spine position, lift using your leg muscles. Lift with your legs, keeping your back straight.  Test the weight of unknown objects before attempting to lift them.  Try to keep your elbows locked down at your sides in order get the best strength from your shoulders  when carrying an object.  Always ask for help when lifting heavy or awkward objects. INCORRECT LIFTING TECHNIQUES DO NOT:   Lock your knees when lifting, even if it is a small object.  Bend and twist. Pivot at your feet or move your feet when needing to change directions.  Assume that you cannot safely pick up a paperclip without proper posture.   This information is not intended to replace advice given to you by your health care provider. Make sure you discuss any questions you have with your health care provider.   Document Released: 03/12/2005 Document Revised: 12/01/2014 Document Reviewed: 06/24/2008 Elsevier Interactive Patient Education 2016 [employer]   Hip Bursitis Bursitis is a swelling and soreness (inflammation) of a fluid-filled sac (bursa). This sac overlies and protects the joints.  CAUSES   Injury.  Overuse of the muscles surrounding the joint.  Arthritis.  Gout.  Infection.  Cold weather.  Inadequate warm-up and conditioning prior to activities. The cause may not be known.  SYMPTOMS   Mild to severe  irritation.  Tenderness and swelling over the outside of the hip.  Pain with motion of the hip.  If the bursa becomes infected, a fever may be present. Redness, tenderness, and warmth will develop over the hip. Symptoms usually lessen in 3 to 4 weeks with treatment, but can come back. TREATMENT If conservative treatment does not work, your caregiver may advise draining the bursa and injecting cortisone into the area. This may speed up the healing process. This may also be used as an initial treatment of choice. HOME CARE INSTRUCTIONS   Apply ice to the affected area for 15-20 minutes every 3 to 4 hours while awake for the first 2 days. Put the ice in a plastic bag and place a towel between the bag of ice and your skin.  Rest the painful joint as much as possible, but continue to put the joint through a normal range of motion at least 4 times per day. When the pain lessens, begin normal, slow movements and usual activities to help prevent stiffness of the hip.  Only take over-the-counter or prescription medicines for pain, discomfort, or fever as directed by your caregiver.  Use crutches to limit weight bearing on the hip joint, if advised.  Elevate your painful hip to reduce swelling. Use pillows for propping and cushioning your legs and hips.  Gentle massage may provide comfort and decrease swelling. SEEK IMMEDIATE MEDICAL CARE IF:   Your pain increases even during treatment, or you are not improving.  You have a fever.  You have heat and inflammation over the involved bursa.  You have any other questions or concerns. MAKE SURE YOU:   Understand these instructions.  Will watch your condition.  Will get help right away if you are not doing well or get worse.   This information is not intended to replace advice given to you by your health care provider. Make sure you discuss any questions you have with your health care provider.   Document Released: 09/01/2001 Document  Revised: 06/04/2011 Document Reviewed: 10/12/2014 Elsevier Interactive Patient Education [employer].

## 2015-08-29 NOTE — Progress Notes (Signed)
Jonathon Taylor is a 70 y.o. male who presents to [facility]: Jonathon Taylor today for back pain and leg pain.  Patient presents to clinic today to establish care. He notes a history of intermittent back pain that typically is not bothersome. Over the last few weeks pain is worsened. He took a trip to [place] and did a lot of hiking. He notes he developed pain during hiking and had significant pain especially with standing from a seated position. He notes pain in his right low back into the lateral hip and buttocks. Additionally his pain radiating to the calf and foot on the right side. He denies any weakness or numbness bowel bladder dysfunction. He's tried some over-the-counter medicines which helped only a little. No fevers or chills nausea vomiting or diarrhea. History of back issues dating back to his adolescence. He was thrown from a horse. He describes a spinal shifting issue that is consistent spondylolisthesis. .   Past Medical History  Diagnosis Date  . Skin cancer     Dr [name]  . Seasonal allergies   . GERD (gastroesophageal reflux disease)   . H/O cardiac catheterization 2004    negative;Dr Tollie Eth  . Personal history of colonic polyps 08/12/2015   Past Surgical History  Procedure Laterality Date  . Arthroscopic knee surgery  1995  . Cardiac catheterization    . Basal cell carcinoma excision      2000-2017   Social History  Substance Use Topics  . Smoking status: Never Smoker   . Smokeless tobacco: Never Used  . Alcohol Use: 0.0 oz/week    0 Standard drinks or equivalent per week     Comment: 2 glasses of red wine nightly   family history includes Heart disease in his father, maternal grandfather, mother, and sister. There is no history of Colon cancer.  ROS as above: No headache, visual changes, nausea, vomiting, diarrhea, constipation,  dizziness, abdominal pain, skin rash, fevers, chills, night sweats, weight loss, swollen lymph nodes, body aches, joint swelling, muscle aches, chest pain, shortness of breath, mood changes, visual or auditory hallucinations.   Medications: Current Outpatient Prescriptions  Medication Sig Dispense Refill  . albuterol (PROVENTIL HFA;VENTOLIN HFA) 108 (90 Base) MCG/ACT inhaler Inhale 2 puffs into the lungs every 6 (six) hours as needed for wheezing. 1 Inhaler 11  . fluticasone (FLONASE) 50 MCG/ACT nasal spray Place 2 sprays into both nostrils daily. 16 g 11  . Fluticasone-Salmeterol (ADVAIR) 100-50 MCG/DOSE AEPB Inhale 1 puff into the lungs 2 (two) times daily. 1 each 11  . OVER THE COUNTER MEDICATION OTC Ibuprofen prn    . triazolam (HALCION) 0.25 MG tablet Take 0.5 tablets (0.125 mg total) by mouth at bedtime as needed for sleep. 15 tablet 4   No current facility-administered medications for this visit.   No Known Allergies   Exam:  BP 135/80 mmHg  Pulse 69  Wt 165 lb (74.844 kg) Gen: Well NAD HEENT: EOMI,  MMM Lungs: Normal work of breathing. CTABL Heart: RRR no MRG Abd: NABS, Soft. Nondistended, Nontender Exts: Brisk  capillary refill, warm and well perfused.  Spine: Nontender to midline. Palpable step-off present at L4-L5 area. Normal back motion. Lower extremity strength is equal and normal throughout. Reflexes are equal and normal throughout. Sensation is intact throughout. Hip normal-appearing. Normal hip motion bilaterally. Tender palpation right greater trochanteric region especially along the course of the gluteus medius muscle. Hip abduction strength is diminished 4/5 Normal gait.  X-ray L-spine pending  No results found for this or any previous visit (from the past 24 hour(s)). No results found.    Assessment and Plan: 70 y.o. male with back pain and hip pain and radicular pain. I think is very likely Jonathon Taylor has spondylolisthesis probably at L4-L5. We'll  obtain a complete lumbar spine x-ray series including flexion and extension views to confirm this diagnosis. Plan I think he probably has greater trochanteric bursitis versus gluteus medius tendinitis.. I think he additionally has a component of L5 lumbar radiculopathy likely related to spondylolisthesis. Fortunately all problems should be treatable with physical therapy. Plan to refer to physical therapy and recheck in about 4 weeks or so.  Discussed warning signs or symptoms. Please see discharge instructions. Patient expresses understanding.

## 2015-08-30 NOTE — Progress Notes (Signed)
Quick Note:  Pars defect with spondylolisthesis like we thought. This should get better with MRI. ______

## 2015-09-01 ENCOUNTER — Ambulatory Visit (INDEPENDENT_AMBULATORY_CARE_PROVIDER_SITE_OTHER): Payer: BLUE CROSS/BLUE SHIELD | Admitting: Physical Therapy

## 2015-09-01 ENCOUNTER — Encounter: Payer: Self-pay | Admitting: Physical Therapy

## 2015-09-01 DIAGNOSIS — M5441 Lumbago with sciatica, right side: Secondary | ICD-10-CM

## 2015-09-01 DIAGNOSIS — M6281 Muscle weakness (generalized): Secondary | ICD-10-CM

## 2015-09-01 DIAGNOSIS — R252 Cramp and spasm: Secondary | ICD-10-CM | POA: Diagnosis not present

## 2015-09-01 NOTE — Patient Instructions (Signed)
Abdominal Bracing With Pelvic Floor (Hook-Lying) - Do not move your pelvis    With neutral spine, tighten pelvic floor and abdominals. Hold 5 seconds. Repeat __10_ times. Do _1__ times a day.  Knee to Chest: Transverse Plane Stability   Bring one knee up, then return. Be sure pelvis does not roll side to side. Keep pelvis still. Lift knee __10_ times each leg. Restabilize pelvis. Repeat with other leg. Do _1-2__ sets, _1__ times per day.  Hip External Rotation With Pillow: Transverse Plane Stability   One knee bent, one leg straight, on pillow. Slowly roll bent knee out. Be sure pelvis does not rotate. Do _10__ times. Restabilize pelvis. Repeat with other leg. Do _1-2__ sets, _1__ times per day.  Heel Slide: 4-10 Inches - Transverse Plane Stability   Slide heel 4 inches down. Be sure pelvis does not rotate. Do _10__ times. Restabilize pelvis. Repeat with other leg. Do __1_ sets, _1__ times per day.   [facility] [address] [place], [place] [address]  [phone] (office) [phone] (fax)  Outer Hip Stretch: Reclined IT Band Stretch (Strap)    Strap around opposite foot, pull across only as far as possible with shoulders on mat. Hold for ____ breaths. Repeat ____ times each leg.  Copyright  VHI. All rights reserved.  Supine With Rotation    Lie on back with one knee drawn toward chest. Slowly bring bent leg across body until stretch is felt in lower back area. Hold __30-45_ seconds. Repeat to other side. Repeat _1__ times per session. Do __1_ sessions per day.  Copyright  VHI. All rights reserved.

## 2015-09-01 NOTE — Therapy (Signed)
[place], [place], [address] Phone: [phone]   Fax:  [phone]  Physical Therapy Evaluation  Patient Details  Name: Jonathon Taylor MRN: [id number] Date of Birth: 1945/05/02 Referring Provider: Dr Georgina Snell  Encounter Date: 09/01/2015      PT End of Session - 09/01/15 1449    Visit Number 1   Number of Visits 12   Date for PT Re-Evaluation 10/13/15   PT Start Time 1449   PT Stop Time 1610   PT Time Calculation (min) 81 min   Activity Tolerance Patient tolerated treatment well      Past Medical History  Diagnosis Date  . Skin cancer     Dr [name]  . Seasonal allergies   . GERD (gastroesophageal reflux disease)   . H/O cardiac catheterization 2004    negative;Dr Tollie Eth  . Personal history of colonic polyps 08/12/2015    Past Surgical History  Procedure Laterality Date  . Arthroscopic knee surgery  1995  . Cardiac catheterization    . Basal cell carcinoma excision      2000-2017    There were no vitals filed for this visit.       Subjective Assessment - 09/01/15 1449    Subjective Pt reports at 70yo he had a horse accident and had L4-5 dislocation and now he feels it moving and as long as this is happening he doesn't have any pinching.   This past spring he was working in the garden with a shovel and the back went out, then he had a trip out [place] to his son's graduation, hiked a lot and pushed through the pain.  The pain has continued and he is staritingto develop bad habits so he decided it was time to see the MD.     How long can you sit comfortably? tolerates ~ 1 hr then has to stretch    Diagnostic tests x-ray - sponylolisthesis bilat Pars defect L5, x-rays show no instability.    Patient Stated Goals not feeling the pain in the back and Rt LE, perform his normal activites.  Ride his bike around [place], sleep on belly again   Currently in Pain? Yes   Pain  Score 6    Pain Location Back   Pain Orientation Right   Pain Type Acute pain   Pain Radiating Towards lower Rt back done hip to anterior ankle and bottom of foot.  Feels like he is stepping on something.    Pain Onset More than a month ago   Pain Frequency Constant   Aggravating Factors  pulling weeds without his back brace on , hip is the worst pain - dull deep annoying    Pain Relieving Factors ice, ibuprofen            [facility] PT Assessment - 09/01/15 0001    Assessment   Medical Diagnosis Lumbago with Rt side sciatica   Referring Provider Dr Georgina Snell   Onset Date/Surgical Date 08/01/15   Hand Dominance Right   Next MD Visit 4 wks   Prior Therapy not for this   Precautions   Precautions --  limit to no lumbar extension d/t dx   Required Braces or Orthoses --  wears velcro lumbar brace when in garden or heavy work   Balance Screen   Has the patient fallen in the past 6 months No   Has the patient had a decrease in activity level because of a  fear of falling?  No   Home Ecologist residence   Living Arrangements Spouse/significant other   Home Layout Two level  no trouble with stairs   Prior Function   Level of Independence Independent   Vocation Full time employment   Vocation Requirements professor, stands and sits   Leisure garden, ride his bike   Observation/Other Assessments   Focus on Therapeutic Outcomes (FOTO)  45% limited   Functional Tests   Functional tests Squat;Single leg stance   Squat   Comments performs with PPT   Single Leg Stance   Comments WNL    Posture/Postural Control   Posture/Postural Control Postural limitations   Postural Limitations Forward head;Decreased lumbar lordosis;Rounded Shoulders   ROM / Strength   AROM / PROM / Strength AROM;Strength   AROM   AROM Assessment Site Lumbar   Lumbar Flexion WNL   Lumbar Extension not tested   pain with Rt rotation and small ext   Lumbar - Right Side Bend WNL    Lumbar - Left Side Bend WNL   Lumbar - Right Rotation WNL   Lumbar - Left Rotation decreased 25% with pinching in low Rt back   Strength   Overall Strength Comments TA Rt side delayed, multifidi good.    Strength Assessment Site Hip;Knee;Ankle   Right/Left Hip Right  Lt WNL   Right Hip Flexion --  5-/5   Right Hip Extension 4-/5  Lt 4+/5   Right Hip ABduction 4-/5   Right/Left Knee --  bilat WNL   Right/Left Ankle --  Lt WNL, Rt dorsiflexion 4-/5   Flexibility   Soft Tissue Assessment /Muscle Length yes   Hamstrings good bilat   ITB tight Rt    Piriformis tight Rt side   Palpation   Palpation comment very tender in Rt piriformis , tight band in Rt back T8-L2   Special Tests    Special Tests Lumbar   Lumbar Tests Slump Test;Straight Leg Raise   Slump test   Findings Positive   Side Right                   [facility] Adult PT Treatment/Exercise - 09/01/15 0001    Exercises   Exercises Lumbar   Lumbar Exercises: Stretches   ITB Stretch 30 seconds  cross body stretch with strap   ITB Stretch Limitations and pull across body   Lumbar Exercises: Supine   Ab Set 5 reps;5 seconds   Clam 10 reps;5 seconds   Heel Slides 10 reps   Bent Knee Raise 10 reps   Modalities   Modalities Electrical Stimulation;Moist Heat   Moist Heat Therapy   Number Minutes Moist Heat 15 Minutes   Moist Heat Location --  lower thoracic/upper lumbar   Electrical Stimulation   Electrical Stimulation Location lower thoracic/upper lumbar   Electrical Stimulation Action IFC   Electrical Stimulation Parameters to tolerance   Electrical Stimulation Goals Pain;Tone                PT Education - 09/01/15 1605    Education provided Yes   Education Details HEP   Person(s) Educated Patient   Methods Explanation;Handout   Comprehension Returned demonstration;Verbalized understanding             PT Long Term Goals - 09/01/15 1447    PT LONG TERM GOAL #1   Title I with advanced  HEP (10/13/15)    Time 6   Period Weeks  Status New   PT LONG TERM GOAL #2   Title improve FOTO =/< 29% limited ( 10/13/15)    Time 6   Period Weeks   Status New   PT LONG TERM GOAL #3   Title report =/> 75% reduction in back and leg symptoms ( 10/13/15)    Time 6   Period Weeks   Status New   PT LONG TERM GOAL #4   Title sleep per his previous level on his belly ( 10/13/15)    Time 6   Period Weeks   Status New   PT LONG TERM GOAL #5   Title demo Rt hip strength =/> 5-/5 ( 10/13/15)    Time 6   Period Weeks   Status New               Plan - 09/01/15 1605    Clinical Impression Statement Patient presents with h/o LBP and radiating into Rt LE, he also has tightness in the Rt buttocks/piriformis and low back. He is able to engage his multifidi fully however has difficulty with abdominal muscles and uses posterior pelvic tilt for stabilization.     Rehab Potential Good   PT Frequency 2x / week   PT Duration 6 weeks   PT Treatment/Interventions Ultrasound;Patient/family education;Cryotherapy;Dry needling;Electrical Stimulation;Moist Heat;Therapeutic exercise;Manual techniques;Taping   PT Next Visit Plan learn 3 part core, pelvic stabilization exercise. TDN piriformis Rt and paraspinals.       Patient will benefit from skilled therapeutic intervention in order to improve the following deficits and impairments:  Postural dysfunction, Decreased strength, Pain, Increased muscle spasms  Visit Diagnosis: Lumbago with sciatica, right side - Plan: PT plan of care cert/re-cert  Muscle weakness (generalized) - Plan: PT plan of care cert/re-cert  Cramp and spasm - Plan: PT plan of care cert/re-cert     Problem List Patient Active Problem List   Diagnosis Date Noted  . Lumbago with sciatica, right side 08/29/2015  . Bursitis of right hip 08/29/2015  . Personal history of colonic polyps 08/12/2015  . Insomnia 01/18/2014  . RAD (reactive airway disease) 11/26/2012  . Mild  hyperlipidemia 11/26/2012  . Family history of cardiovascular disease 11/26/2012    Jeral Pinch PT  09/01/2015, 4:15 PM  [facility] [place], [place], [address] Phone: [phone]   Fax:  [phone]  Name: Jonathon Taylor MRN: [id number] Date of Birth: 1945/12/20

## 2015-09-05 ENCOUNTER — Ambulatory Visit (INDEPENDENT_AMBULATORY_CARE_PROVIDER_SITE_OTHER): Payer: BLUE CROSS/BLUE SHIELD | Admitting: Physical Therapy

## 2015-09-05 DIAGNOSIS — M6281 Muscle weakness (generalized): Secondary | ICD-10-CM

## 2015-09-05 DIAGNOSIS — M5441 Lumbago with sciatica, right side: Secondary | ICD-10-CM

## 2015-09-05 DIAGNOSIS — R252 Cramp and spasm: Secondary | ICD-10-CM

## 2015-09-05 NOTE — Patient Instructions (Signed)
Cat / Cow Flow    Inhale, press spine toward ceiling like a Halloween cat. Keeping strength in arms and abdominals, exhale to soften spine through neutral and into cow pose. Open chest and arch back. Initiate movement between cat and cow at tailbone, one vertebrae at a time. Repeat _10___ times.  2-3 times a day.   Copyright  VHI. All rights reserved.

## 2015-09-05 NOTE — Therapy (Signed)
[place], [place], [address] Phone: [phone]   Fax:  [phone]  Physical Therapy Treatment  Patient Details  Name: Jonathon Taylor MRN: [id number] Date of Birth: [birth year] Referring Provider: Dr Georgina Snell  Encounter Date: 09/05/2015      PT End of Session - 09/05/15 1528    Visit Number 2   Number of Visits 12   Date for PT Re-Evaluation 10/13/15   PT Start Time 9169   PT Stop Time 1616   PT Time Calculation (min) 47 min      Past Medical History  Diagnosis Date  . Skin cancer     Dr [name]  . Seasonal allergies   . GERD (gastroesophageal reflux disease)   . H/O cardiac catheterization 2004    negative;Dr Tollie Eth  . Personal history of colonic polyps 08/12/2015    Past Surgical History  Procedure Laterality Date  . Arthroscopic knee surgery  1995  . Cardiac catheterization    . Basal cell carcinoma excision      2000-2017    There were no vitals filed for this visit.      Subjective Assessment - 09/05/15 1529    Subjective Pt reports he is feeling a little better, he is not needing as much ice lately. Doing his HEP, working on learning what neutral pelvis feels like.    Currently in Pain? Yes   Pain Score 3    Pain Location Back   Pain Orientation Right   Pain Type Acute pain   Pain Onset More than a month ago   Pain Frequency Intermittent   Aggravating Factors  bending forward activities    Pain Relieving Factors ice                          [facility] Adult PT Treatment/Exercise - 09/05/15 0001    Self-Care   Self-Care Other Self-Care Comments   Other Self-Care Comments  trigger point release with ball to Rt gluts/piriformis and QL, low back   Lumbar Exercises: Aerobic   Elliptical L3 x 5'   Lumbar Exercises: Supine   Other Supine Lumbar Exercises 10 reps leg circles, CW/CCW each side with abd bracing   Other Supine Lumbar Exercises  2x10pilates table tops with heel taps.    Modalities   Modalities Electrical Stimulation;Moist Heat   Moist Heat Therapy   Number Minutes Moist Heat 15 Minutes   Moist Heat Location Lumbar Spine  and thoracic   Electrical Stimulation   Electrical Stimulation Location lower thoracic and lumbar   Electrical Stimulation Action IFC t   Electrical Stimulation Parameters to tolerance    Electrical Stimulation Goals Pain;Tone                PT Education - 09/05/15 1556    Education provided Yes   Education Details cat/cow, use of ball for trigger point release.    Person(s) Educated Patient   Methods Explanation;Demonstration;Handout   Comprehension Returned demonstration;Verbalized understanding             PT Long Term Goals - 09/01/15 1447    PT LONG TERM GOAL #1   Title I with advanced HEP (10/13/15)    Time 6   Period Weeks   Status New   PT LONG TERM GOAL #2   Title improve FOTO =/< 29% limited ( 10/13/15)    Time 6   Period Weeks   Status  New   PT LONG TERM GOAL #3   Title report =/> 75% reduction in back and leg symptoms ( 10/13/15)    Time 6   Period Weeks   Status New   PT LONG TERM GOAL #4   Title sleep per his previous level on his belly ( 10/13/15)    Time 6   Period Weeks   Status New   PT LONG TERM GOAL #5   Title demo Rt hip strength =/> 5-/5 ( 10/13/15)    Time 6   Period Weeks   Status New               Plan - 09/05/15 1702    Clinical Impression Statement Pt is making progress, he has less pain than on initial visit.  He continues to use PPT to stabilze, he had good releif with ball trigger point work.  Only the second visit , no goals met.    Rehab Potential Good   PT Frequency 2x / week   PT Duration 6 weeks   PT Treatment/Interventions Ultrasound;Patient/family education;Cryotherapy;Dry needling;Electrical Stimulation;Moist Heat;Therapeutic exercise;Manual techniques;Taping   PT Next Visit Plan progress core.    Consulted and  Agree with Plan of Care Patient      Patient will benefit from skilled therapeutic intervention in order to improve the following deficits and impairments:  Postural dysfunction, Decreased strength, Pain, Increased muscle spasms  Visit Diagnosis: Lumbago with sciatica, right side  Muscle weakness (generalized)  Cramp and spasm     Problem List Patient Active Problem List   Diagnosis Date Noted  . Lumbago with sciatica, right side 08/29/2015  . Bursitis of right hip 08/29/2015  . Personal history of colonic polyps 08/12/2015  . Insomnia 01/18/2014  . RAD (reactive airway disease) 11/26/2012  . Mild hyperlipidemia 11/26/2012  . Family history of cardiovascular disease 11/26/2012    Jeral Pinch PT  09/05/2015, 5:04 PM  [facility] [place], [place], [address] Phone: [phone]   Fax:  [phone]  Name: PRANAY HILBUN MRN: [id number] Date of Birth: 1946/01/25

## 2015-09-08 ENCOUNTER — Ambulatory Visit (INDEPENDENT_AMBULATORY_CARE_PROVIDER_SITE_OTHER): Payer: BLUE CROSS/BLUE SHIELD | Admitting: Physical Therapy

## 2015-09-08 DIAGNOSIS — M5441 Lumbago with sciatica, right side: Secondary | ICD-10-CM

## 2015-09-08 DIAGNOSIS — R252 Cramp and spasm: Secondary | ICD-10-CM | POA: Diagnosis not present

## 2015-09-08 DIAGNOSIS — M6281 Muscle weakness (generalized): Secondary | ICD-10-CM

## 2015-09-08 NOTE — Therapy (Addendum)
[place], [place], [address] Phone: [phone]   Fax:  [phone]  Physical Therapy Treatment  Patient Details  Name: Jonathon Taylor MRN: [id number] Date of Birth: 12-01-1945 Referring Provider: Dr Georgina Snell  Encounter Date: 09/08/2015      PT End of Session - 09/08/15 1449    Visit Number 3   Number of Visits 12   Date for PT Re-Evaluation 10/13/15   PT Start Time 1446   PT Stop Time 1550   PT Time Calculation (min) 64 min      Past Medical History  Diagnosis Date  . Skin cancer     Dr [name]  . Seasonal allergies   . GERD (gastroesophageal reflux disease)   . H/O cardiac catheterization 2004    negative;Dr Tollie Eth  . Personal history of colonic polyps 08/12/2015    Past Surgical History  Procedure Laterality Date  . Arthroscopic knee surgery  1995  . Cardiac catheterization    . Basal cell carcinoma excision      2000-2017    There were no vitals filed for this visit.      Subjective Assessment - 09/08/15 1449    Subjective Pt has been using heat prior to stretching.It has been helpful.   Pain in Rt shin woke him up so he used heat, massage, and stretches helped to reduced pain.    Diagnostic tests x-ray - sponylolisthesis bilat Pars defect L5, x-rays show no instability.    Currently in Pain? Yes   Pain Score 1    Pain Location Back   Pain Orientation Right   Pain Radiating Towards mainly in Rt anterior shin.  Pain travels down Rt leg to Rt middle toes.             [facility] PT Assessment - 09/08/15 0001    Assessment   Medical Diagnosis Lumbago with Rt side sciatica   Onset Date/Surgical Date 08/01/15   Hand Dominance Right   Next MD Visit 4 wks   Prior Therapy not for this          [facility] Adult PT Treatment/Exercise - 09/08/15 0001    Self-Care   Self-Care Other Self-Care Comments   Other Self-Care Comments  Reviewed log roll for supine to/from  sit.  Pt returned demo 5x    Lumbar Exercises: Stretches   Passive Hamstring Stretch 30 seconds;3 reps   Lower Trunk Rotation 2 reps;30 seconds  each direction   ITB Stretch 3 reps;30 seconds  each leg   Lumbar Exercises: Aerobic   Stationary Bike NuStep L5: 5.5 min    Lumbar Exercises: Supine   Ab Set 5 reps;5 seconds   Clam 10 reps;5 seconds  each leg with abdominal bracing   Heel Slides 10 reps  each leg with abd bracing    Bent Knee Raise 10 reps  each leg with abd bracing.    Other Supine Lumbar Exercises 10 reps leg circles, CW/CCW each side with abd bracing   Other Supine Lumbar Exercises 2x10pilates table tops with heel taps.    Lumbar Exercises: Quadruped   Opposite Arm/Leg Raise 5 reps;Right arm/Left leg;Left arm/Right leg   Moist Heat Therapy   Number Minutes Moist Heat 15 Minutes   Moist Heat Location Lumbar Spine  and thoracic   Electrical Stimulation   Electrical Stimulation Location Rt hip/ glute and Rt lateral shin    Electrical Stimulation Action pre mod to each area  Electrical Stimulation Parameters to tolerance    Electrical Stimulation Goals Pain                PT Education - 09/08/15 1822    Education provided Yes   Education Details info on TENS.    Person(s) Educated Patient   Methods Handout;Explanation   Comprehension Verbalized understanding             PT Long Term Goals - 09/01/15 1447    PT LONG TERM GOAL #1   Title I with advanced HEP (10/13/15)    Time 6   Period Weeks   Status New   PT LONG TERM GOAL #2   Title improve FOTO =/< 29% limited ( 10/13/15)    Time 6   Period Weeks   Status New   PT LONG TERM GOAL #3   Title report =/> 75% reduction in back and leg symptoms ( 10/13/15)    Time 6   Period Weeks   Status New   PT LONG TERM GOAL #4   Title sleep per his previous level on his belly ( 10/13/15)    Time 6   Period Weeks   Status New   PT LONG TERM GOAL #5   Title demo Rt hip strength =/> 5-/5 ( 10/13/15)     Time 6   Period Weeks   Status New               Plan - 09/08/15 1819    Clinical Impression Statement Pt required multiple cues for correct form, but quickly corrected. Pt tolerated all exercises without increased pain.  Progressing towards goals.    Rehab Potential Good   PT Frequency 2x / week   PT Duration 6 weeks   PT Treatment/Interventions Ultrasound;Patient/family education;Cryotherapy;Dry needling;Electrical Stimulation;Moist Heat;Therapeutic exercise;Manual techniques;Taping   PT Next Visit Plan Pt requested to hold therapy 1 wk, then return for TDN to Rt hip/LB.     Consulted and Agree with Plan of Care Patient      Patient will benefit from skilled therapeutic intervention in order to improve the following deficits and impairments:  Postural dysfunction, Decreased strength, Pain, Increased muscle spasms  Visit Diagnosis: Lumbago with sciatica, right side  Muscle weakness (generalized)  Cramp and spasm     Problem List Patient Active Problem List   Diagnosis Date Noted  . Lumbago with sciatica, right side 08/29/2015  . Bursitis of right hip 08/29/2015  . Personal history of colonic polyps 08/12/2015  . Insomnia 01/18/2014  . RAD (reactive airway disease) 11/26/2012  . Mild hyperlipidemia 11/26/2012  . Family history of cardiovascular disease 11/26/2012   Kerin Perna, PTA 09/08/2015 6:24 PM  [facility] [place], [place], [address] Phone: [phone]   Fax:  [phone]  Name: Jonathon Taylor MRN: [id number] Date of Birth: [birth year]  PHYSICAL THERAPY DISCHARGE SUMMARY  Visits from Start of Care: 3  Current functional level related to goals / functional outcomes: unknown   Remaining deficits: unknown   Education / Equipment: HEP Plan:                                                    Patient goals were not met. Patient is being discharged due to not  returning since the last visit.  ?????  Jeral Pinch, PT 02/02/16 1:53 PM

## 2015-09-19 DIAGNOSIS — D239 Other benign neoplasm of skin, unspecified: Secondary | ICD-10-CM | POA: Diagnosis not present

## 2015-09-19 DIAGNOSIS — L570 Actinic keratosis: Secondary | ICD-10-CM | POA: Diagnosis not present

## 2015-10-03 ENCOUNTER — Encounter: Payer: Self-pay | Admitting: Family Medicine

## 2015-10-03 ENCOUNTER — Ambulatory Visit (INDEPENDENT_AMBULATORY_CARE_PROVIDER_SITE_OTHER): Payer: BLUE CROSS/BLUE SHIELD | Admitting: Family Medicine

## 2015-10-03 VITALS — BP 150/80 | HR 66 | Wt 164.0 lb

## 2015-10-03 DIAGNOSIS — M4310 Spondylolisthesis, site unspecified: Secondary | ICD-10-CM

## 2015-10-03 DIAGNOSIS — R030 Elevated blood-pressure reading, without diagnosis of hypertension: Secondary | ICD-10-CM

## 2015-10-03 DIAGNOSIS — IMO0001 Reserved for inherently not codable concepts without codable children: Secondary | ICD-10-CM

## 2015-10-03 DIAGNOSIS — E785 Hyperlipidemia, unspecified: Secondary | ICD-10-CM

## 2015-10-03 NOTE — Progress Notes (Signed)
       Jonathon Taylor is a 70 y.o. male who presents to [facility] today for follow-up back pain. Patient was seen about a month ago for low back pain. 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 He's had several visits with physical therapy and feels now completely pain-free.  Other health issues: Patient has mildly elevated cholesterol. He elected not to take cholesterol lowering medicines at this time. He denies any chest pains palpitations shortness of breath.   Past Medical History  Diagnosis Date  . Skin cancer     Dr [name]  . Seasonal allergies   . GERD (gastroesophageal reflux disease)   . H/O cardiac catheterization 2004    negative;Dr Tollie Eth  . Personal history of colonic polyps 08/12/2015   Past Surgical History  Procedure Laterality Date  . Arthroscopic knee surgery  1995  . Cardiac catheterization    . Basal cell carcinoma excision      2000-2017   Social History  Substance Use Topics  . Smoking status: Never Smoker   . Smokeless tobacco: Never Used  . Alcohol Use: 0.0 oz/week    0 Standard drinks or equivalent per week     Comment: 2 glasses of red wine nightly   family history includes Heart disease in his father, maternal grandfather, mother, and sister. There is no history of Colon cancer.  ROS as above:  Medications: Current Outpatient Prescriptions  Medication Sig Dispense Refill  . albuterol (PROVENTIL HFA;VENTOLIN HFA) 108 (90 Base) MCG/ACT inhaler Inhale 2 puffs into the lungs every 6 (six) hours as needed for wheezing. 1 Inhaler 11  . fluticasone (FLONASE) 50 MCG/ACT nasal spray Place 2 sprays into both nostrils daily. 16 g 11  . Fluticasone-Salmeterol (ADVAIR) 100-50 MCG/DOSE AEPB Inhale 1 puff into the lungs 2 (two) times daily. 1 each 11  . OVER THE COUNTER MEDICATION OTC Ibuprofen prn    . triazolam  (HALCION) 0.25 MG tablet Take 0.5 tablets (0.125 mg total) by mouth at bedtime as needed for sleep. 15 tablet 4   No current facility-administered medications for this visit.   No Known Allergies   Exam:  BP 150/80 mmHg  Pulse 66  Wt 164 lb (74.39 kg) Gen: Well NAD HEENT: EOMI,  MMM Lungs: Normal work of breathing. CTABL Heart: RRR no MRG Abd: NABS, Soft. Nondistended, Nontender Exts: Brisk capillary refill, warm and well perfused.  Back: Nontender to midline. Normal flexion. Impaired extension. Normal gait.  Lab Results  Component Value Date   CHOL 224* 04/27/2015   HDL 114 04/27/2015   LDLCALC 102 04/27/2015   TRIG 41 04/27/2015   CHOLHDL 2.0 04/27/2015     No results found for this or any previous visit (from the past 24 hour(s)). No results found.    Assessment and Plan: 70 y.o. male with  1) back pain: Due to spondylolisthesis. Continue home physical therapy exercises 2) hypercholesterolemia: CV risk factor greater than 10%. Patient declines medication at this time. We'll recheck labs in February of 2018. 3) elevated blood pressure: Isolated elevated today. Recheck in 6 months   Discussed warning signs or symptoms. Please see discharge instructions. Patient expresses understanding.

## 2015-10-03 NOTE — Patient Instructions (Signed)
Thank you for coming in today. Return in 6 months for a well visit.  Continue exercises.   Spondylolisthesis With Rehab The slipping of one or multiple vertebrae out of the correct anatomical position is a condition known as spondylolisthesis. Spondylolisthesis is most common in adolescents and is caused by a number of different reasons, such as vertebral fracture or something you are born with (congenital). Spondylolisthesis is diagnosed with the use of X-rays. SYMPTOMS   Dull, achy pain in the lower back.  Pain that worsens with extension of the spine.  Tightness of the muscles on the back of the thigh.  Lower back stiffness.  Signs of nerve damage: pain, numbness, or weakness affecting one or both lower extremities.  Muscle wasting (atrophy), uncommon.  Loss of stool (bowel) or urine (bladder) function. CAUSES  The symptoms of spondylolisthesis are caused by one or more vertebrae that are out of alignment, placing pressure on the spinal cord. Common mechanisms of injury include:  Congenital defect of the spine.  Degenerative process.  Stress fracture of the spine.  Fracture due to trauma to the spine. RISK INCREASES WITH:  Activities that have a risk of hyperextending the back.  Activities that have a risk of excessively rotating the spine.  Poor strength and flexibility.  Failure to warm up properly before activity.  Family history of spondylolysis or spondylolisthesis.  Improper sports technique. PREVENTION  Warm up and stretch properly before activity.  Allow for adequate recovery between workouts.  Maintain physical fitness:  Strength, flexibility, and endurance.  Cardiovascular fitness.  Learn and use proper technique. When possible, have a coach correct improper technique. PROGNOSIS  If treated properly, the spondylolisthesis usually resolves. RELATED COMPLICATIONS   Recurrent symptoms that result in a chronic problem.  Inability to compete in  athletics.  Prolonged healing time, if improperly treated or reinjured.  Failure of the fracture to heal (nonunion).  Healing of the fracture in a poor position (malunion). TREATMENT Treatment initially involves resting from any activities that aggravate the symptoms and the use of ice and medications to help reduce pain and inflammation. The use of strengthening and stretching exercises may help reduce pain with activity. These exercises may be performed at home or with referral to a therapist. It is important to learn how to use proper body mechanics as to not place undue stress on your spine. If the injury is severe, then your caregiver may recommend a back brace to allow for healing, or even surgery. Surgery often involves fusing two adjacent vertebrae so no movement is allowed between them.  MEDICATION   If pain medication is necessary, then nonsteroidal anti-inflammatory medications, such as aspirin and ibuprofen, or other minor pain relievers, such as acetaminophen, are often recommended.  Do not take pain medication for 7 days before surgery.  Prescription pain relievers may be given if deemed necessary by your caregiver. Use only as directed and only as much as you need. HEAT AND COLD  Cold treatment (icing) relieves pain and reduces inflammation. Cold treatment should be applied for 10 to 15 minutes every 2 to 3 hours for inflammation and pain and immediately after any activity that aggravates your symptoms. Use ice packs or massage the area with a piece of ice (ice massage).  Heat treatment may be used prior to performing the stretching and strengthening activities prescribed by your caregiver, physical therapist, or athletic trainer. Use a heat pack or soak the injury in warm water. SEEK MEDICAL CARE IF:  Treatment seems to  offer no benefit, or the condition worsens.  Any medications produce adverse side effects.  Any complications from surgery occur:  Pain, numbness, or  coldness in the extremity operated upon.  Discoloration of the nail beds (they become blue or gray) of the extremity operated upon.  Signs of infections (fever, pain, inflammation, redness, or persistent bleeding). EXERCISES RANGE OF MOTION (ROM) AND STRETCHING EXERCISES - Spondylolisthesis Most people with low back pain will find that their symptoms worsen with either excessive bending forward (flexion) or arching at the low back (extension). The exercises which will help resolve your symptoms will focus on the opposite motion. Your physician, physical therapist or athletic trainer will help you determine which exercises will be most helpful to resolve your low back pain. Do not complete any exercises without first consulting with your clinician. Discontinue any exercises which worsen your symptoms until you speak to your clinician. If you have pain, numbness or tingling which travels down into your buttocks, leg, or foot, the goal of the therapy is for these symptoms to move closer to your back and eventually resolve. Occasionally, these leg symptoms will get better, but your low back pain may worsen; this is typically an indication of progress in your rehabilitation. Be certain to be very alert to any changes in your symptoms and the activities in which you participated in the 24 hours prior to the change. Sharing this information with your clinician will allow him/her to most efficiently treat your condition. These exercises may help you when beginning to rehabilitate your injury. Your symptoms may resolve with or without further involvement from your physician, physical therapist or athletic trainer. While completing these exercises, remember:   Restoring tissue flexibility helps normal motion to return to the joints. This allows healthier, less painful movement and activity.  An effective stretch should be held for at least 30 seconds.  A stretch should never be painful. You should only feel a  gentle lengthening or release in the stretched tissue. FLEXION RANGE OF MOTION AND STRETCHING EXERCISES: STRETCH - Flexion, Single Knee to Chest  Lie on a firm bed or floor with both legs extended in front of you.  Keeping one leg in contact with the floor, bring your opposite knee to your chest. Hold your leg in place by either grabbing behind your thigh or at your knee.  Pull until you feel a gentle stretch in your low back. Hold __________ seconds. Slowly release your grasp and repeat the exercise with the opposite side. Repeat __________ times. Complete this exercise __________ times per day.  STRETCH - Flexion, Double Knee to Chest  Lie on a firm bed or floor with both legs extended in front of you.  Keeping one leg in contact with the floor, bring your opposite knee to your chest.  Tense your stomach muscles to support your back and then lift your other knee to your chest. Hold your legs in place by either grabbing behind your thighs or at your knees.  Pull both knees toward your chest until you feel a gentle stretch in your low back. Hold __________ seconds.  Tense your stomach muscles and slowly return one leg at a time to the floor. Repeat __________ times. Complete this exercise __________ times per day.  STRENGTHENING EXERCISES - Spondylolisthesis These exercises may help you when beginning to rehabilitate your injury. These exercises should be done near your "sweet spot." This is the neutral, low-back arch, somewhere between fully rounded and fully arched, that is your least  painful position. When performed in this safe range of motion, these exercises can be used for people who have either a flexion or extension based injury. These exercises may resolve your symptoms with or without further involvement from your physician, physical therapist or athletic trainer. While completing these exercises, remember:   Muscles can gain both the endurance and the strength needed for  everyday activities through controlled exercises.  Complete these exercises as instructed by your physician, physical therapist or athletic trainer. Progress the resistance and repetitions only as guided.  You may experience muscle soreness or fatigue, but the pain or discomfort you are trying to eliminate should never worsen during these exercises. If this pain does worsen, stop and make certain you are following the directions exactly. If the pain is still present after adjustments, discontinue the exercise until you can discuss the trouble with your clinician. STRENGTHENING - Deep Abdominals, Pelvic Tilt   Lie on a firm bed or floor. Keeping your legs in front of you, bend your knees so they are both pointed toward the ceiling and your feet are flat on the floor.  Tense your lower abdominal muscles to press your low back into the floor. This motion will rotate your pelvis so that your tail bone is scooping upwards rather than pointing at your feet or into the floor.  With a gentle tension and even breathing, hold this position for __________ seconds. Repeat __________ times. Complete this exercise __________ times per day.  STRENGTHENING - Abdominals, Crunches   Lie on a firm bed or floor. Keeping your legs in front of you, bend your knees so they are both pointed toward the ceiling and your feet are flat on the floor. Cross your arms over your chest.  Slightly tip your chin down without bending your neck.  Tense your abdominals and slowly lift your trunk high enough to just clear your shoulder blades. Lifting higher can put excessive stress on the low back and does not further strengthen your abdominal muscles.  Control your return to the starting position. Repeat __________ times. Complete this exercise __________ times per day.  STRENGTHENING - Quadruped, Opposite UE/LE Lift   Assume a hands and knees position on a firm surface. Keep your hands under your shoulders and your knees under  your hips. You may place padding under your knees for comfort.  Find your neutral spine and gently tense your abdominal muscles so that you can maintain this position. Your shoulders and hips should form a rectangle that is parallel with the floor and is not twisted.  Keeping your trunk steady, lift your right hand no higher than your shoulder and then your left leg no higher than your hip. Make sure you are not holding your breath. Hold this position __________ seconds.  Continuing to keep your abdominal muscles tense and your back steady, slowly return to your starting position. Repeat with the opposite arm and leg. Repeat __________ times. Complete this exercise __________ times per day.  STRENGTHENING - Lower Abdominals, Double Knee Lift  Lie on a firm bed or floor. Keeping your legs in front of you, bend your knees so they are both pointed toward the ceiling and your feet are flat on the floor.  Tense your abdominal muscles to brace your low back and slowly lift both of your knees until they come over your hips. Be certain not to hold your breath.  Hold __________ seconds. Using your abdominal muscles, return to the starting position in a slow and  controlled manner. Repeat __________ times. Complete this exercise __________ times per day.  POSTURE AND BODY MECHANICS CONSIDERATIONS - Spondylolisthesis Keeping correct posture when sitting, standing or completing your activities will reduce the stress put on different body tissues, allowing injured tissues a chance to heal and limiting painful experiences. The following are general guidelines for improved posture. Your physician or physical therapist will provide you with any instructions specific to your needs. While reading these guidelines, remember:  The exercises prescribed by your provider will help you have the flexibility and strength to maintain correct postures.  The correct posture provides the optimal environment for your joints to  work. All of your joints have less wear and tear when properly supported by a spine with good posture. This means you will experience a healthier, less painful body.  Correct posture must be practiced with all of your activities, especially prolonged sitting and standing. Correct posture is as important when doing repetitive low-stress activities (typing) as it is when doing a single heavy-load activity (lifting). PROPER SITTING POSTURE In order to minimize stress and discomfort on your spine, you must sit with correct posture. Sitting with good posture should be effortless for a healthy body. Returning to good posture is a gradual process. Many people can work toward this most comfortably by using various supports until they have the flexibility and strength to maintain this posture on their own. When sitting with proper posture, your ears will fall over your shoulders and your shoulders will fall over your hips. You should use the back of the chair to support your upper back. Your low back will be in a neutral position, just slightly arched. You may place a small pillow or folded towel at the base of your low back for  support.  When working at a desk, create an environment that supports good, upright posture. Without extra support, muscles fatigue and lead to excessive strain on joints and other tissues. Keep these recommendations in mind: CHAIR:  A chair should be able to slide under your desk when your back makes contact with the back of the chair. This allows you to work closely.  The chair's height should allow your eyes to be level with the upper part of your monitor and your hands to be slightly lower than your elbows. BODY POSITION  Your feet should make contact with the floor. If this is not possible, use a foot rest.  Keep your ears over your shoulders. This will reduce stress on your neck and low back. INCORRECT SITTING POSTURES If you are feeling tired and unable to assume a healthy  sitting posture, do not slouch or slump. This puts excessive strain on your back tissues, causing more damage and pain. Healthier options include:  Using more support, like a lumbar pillow.  Switching tasks to something that requires you to be upright or walking.  Taking a brief walk.  Lying down to rest in a neutral-spine position.  CORRECT LIFTING TECHNIQUES DO:   Assume a wide stance. This will provide you more stability and the opportunity to get as close as possible to the object which you are lifting.  Tense your abdominals to brace your spine; then bend at the knees and hips. Keeping your back locked in a neutral-spine position, lift using your leg muscles. Lift with your legs, keeping your back straight.  Test the weight of unknown objects before attempting to lift them.  Try to keep your elbows locked down at your sides in order get  the best strength from your shoulders when carrying an object.  Always ask for help when lifting heavy or awkward objects. INCORRECT LIFTING TECHNIQUES DO NOT:   Lock your knees when lifting, even if it is a small object.  Bend and twist. Pivot at your feet or move your feet when needing to change directions.  Assume that you cannot safely pick up a paperclip without proper posture.   This information is not intended to replace advice given to you by your health care provider. Make sure you discuss any questions you have with your health care provider.   Document Released: 03/12/2005 Document Revised: 12/01/2014 Document Reviewed: 06/24/2008 Elsevier Interactive Patient Education [employer].

## 2015-11-17 ENCOUNTER — Encounter: Payer: Self-pay | Admitting: Family Medicine

## 2016-01-23 ENCOUNTER — Other Ambulatory Visit: Payer: Self-pay | Admitting: Dermatology

## 2016-01-23 DIAGNOSIS — D485 Neoplasm of uncertain behavior of skin: Secondary | ICD-10-CM | POA: Diagnosis not present

## 2016-04-02 ENCOUNTER — Telehealth: Payer: Self-pay

## 2016-04-02 NOTE — Telephone Encounter (Signed)
Last ov 09/2015 with dr Georgina Snell new pcp

## 2016-04-04 NOTE — Telephone Encounter (Signed)
I dont see any attached notes. What is this in regard to?

## 2016-04-04 NOTE — Telephone Encounter (Signed)
Sorry,,,it is a refill request for triazolam, not sure where the attached request went

## 2016-04-05 MED ORDER — TRIAZOLAM 0.25 MG PO TABS: 0.1250 mg | ORAL_TABLET | Freq: Every evening | ORAL | 4 refills | Status: DC | PRN

## 2016-04-05 NOTE — Telephone Encounter (Signed)
Refill insomnia medicine

## 2016-04-17 ENCOUNTER — Other Ambulatory Visit: Payer: Self-pay

## 2016-04-17 MED ORDER — FLUTICASONE-SALMETEROL 100-50 MCG/DOSE IN AEPB: 1.0000 | INHALATION_SPRAY | Freq: Two times a day (BID) | RESPIRATORY_TRACT | 11 refills | Status: DC

## 2016-04-26 ENCOUNTER — Other Ambulatory Visit: Payer: Self-pay

## 2016-04-26 DIAGNOSIS — E784 Other hyperlipidemia: Secondary | ICD-10-CM | POA: Diagnosis not present

## 2016-04-26 DIAGNOSIS — E7849 Other hyperlipidemia: Secondary | ICD-10-CM

## 2016-04-26 LAB — COMPLETE METABOLIC PANEL WITH GFR
ALT: 12 U/L (ref 9–46)
AST: 14 U/L (ref 10–35)
Albumin: 4.1 g/dL (ref 3.6–5.1)
Alkaline Phosphatase: 42 U/L (ref 40–115)
BILIRUBIN TOTAL: 0.6 mg/dL (ref 0.2–1.2)
BUN: 15 mg/dL (ref 7–25)
CO2: 25 mmol/L (ref 20–31)
CREATININE: 1.01 mg/dL (ref 0.70–1.25)
Calcium: 9 mg/dL (ref 8.6–10.3)
Chloride: 106 mmol/L (ref 98–110)
GFR, Est African American: 87 mL/min (ref >=60)
GFR, Est Non African American: 76 mL/min (ref >=60)
GLUCOSE: 85 mg/dL (ref 65–99)
Potassium: 4.4 mmol/L (ref 3.5–5.3)
SODIUM: 141 mmol/L (ref 135–146)
TOTAL PROTEIN: 6.5 g/dL (ref 6.1–8.1)

## 2016-05-02 ENCOUNTER — Encounter: Payer: Self-pay | Admitting: Family Medicine

## 2016-05-02 ENCOUNTER — Ambulatory Visit (INDEPENDENT_AMBULATORY_CARE_PROVIDER_SITE_OTHER): Payer: BLUE CROSS/BLUE SHIELD | Admitting: Family Medicine

## 2016-05-02 VITALS — BP 130/72 | HR 70 | Wt 166.0 lb

## 2016-05-02 DIAGNOSIS — Z23 Encounter for immunization: Secondary | ICD-10-CM | POA: Diagnosis not present

## 2016-05-02 DIAGNOSIS — Z0000 Encounter for general adult medical examination without abnormal findings: Secondary | ICD-10-CM | POA: Diagnosis not present

## 2016-05-02 DIAGNOSIS — Z8249 Family history of ischemic heart disease and other diseases of the circulatory system: Secondary | ICD-10-CM

## 2016-05-02 DIAGNOSIS — E7849 Other hyperlipidemia: Secondary | ICD-10-CM

## 2016-05-02 DIAGNOSIS — Z1159 Encounter for screening for other viral diseases: Secondary | ICD-10-CM

## 2016-05-02 DIAGNOSIS — E784 Other hyperlipidemia: Secondary | ICD-10-CM | POA: Diagnosis not present

## 2016-05-02 DIAGNOSIS — Z125 Encounter for screening for malignant neoplasm of prostate: Secondary | ICD-10-CM | POA: Diagnosis not present

## 2016-05-02 NOTE — Patient Instructions (Addendum)
Thank you for coming in today. Get fasting labs in the near future.  Continue current regemin.  Recheck in 1 year or sooner if needed.  Resume asthma medicine when getting more symptoms.     -

## 2016-05-02 NOTE — Progress Notes (Signed)
Jonathon Taylor is a 71 y.o. male who presents to [facility] today for well adult visit. Patient presents to clinic today for wellness exam. He has done well in the last year with no significant medical problems. He takes triazolam intermittently as needed for insomnia. He does have asthma and uses Advair during his allergy and asthma seasons. He's not having to use any now and doesn't have to use any albuterol. Additionally she's had spondylolisthesis in the past but is doing very well following physical therapy and he continues his home exercise program. He is interested in influenza vaccine and Tdap vaccines today. He exercises regularly and feels well. He tries eating a healthy diet. He denies any depression symptoms.   Past Medical History:  Diagnosis Date  . GERD (gastroesophageal reflux disease)   . H/O cardiac catheterization 2004   negative;Dr Tollie Eth  . Personal history of colonic polyps 08/12/2015  . Seasonal allergies   . Skin cancer    Dr [name]   Past Surgical History:  Procedure Laterality Date  . arthroscopic knee surgery  1995  . BASAL CELL CARCINOMA EXCISION     2000-2017  . CARDIAC CATHETERIZATION     Social History  Substance Use Topics  . Smoking status: Never Smoker  . Smokeless tobacco: Never Used  . Alcohol use 0.0 oz/week     Comment: 2 glasses of red wine nightly   family history includes Heart disease in his father, maternal grandfather, mother, and sister.  ROS as above:  Medications: Current Outpatient Prescriptions  Medication Sig Dispense Refill  . albuterol (PROVENTIL HFA;VENTOLIN HFA) 108 (90 Base) MCG/ACT inhaler Inhale 2 puffs into the lungs every 6 (six) hours as needed for wheezing. 1 Inhaler 11  . OVER THE COUNTER MEDICATION OTC Ibuprofen prn    . triazolam (HALCION) 0.25 MG tablet Take 0.5 tablets (0.125  mg total) by mouth at bedtime as needed for sleep. 30 tablet 4  . fluticasone (FLONASE) 50 MCG/ACT nasal spray Place 2 sprays into both nostrils daily. (Patient not taking: Reported on 05/02/2016) 16 g 11  . Fluticasone-Salmeterol (ADVAIR) 100-50 MCG/DOSE AEPB Inhale 1 puff into the lungs 2 (two) times daily. (Patient not taking: Reported on 05/02/2016) 1 each 11   No current facility-administered medications for this visit.    No Known Allergies  Health Maintenance Health Maintenance  Topic Date Due  . Hepatitis C Screening  30-May-1945  . INFLUENZA VACCINE  10/25/2015  . TETANUS/TDAP  11/25/2015  . COLONOSCOPY  08/08/2018  . ZOSTAVAX  Completed  . PNA vac Low Risk Adult  Completed     Exam:  BP 130/72   Pulse 70   Wt 166 lb (75.3 kg)   SpO2 99%   BMI 24.87 kg/m  Gen: Well NAD HEENT: EOMI,  MMM Lungs: Normal work of breathing. CTABL Heart: RRR no MRG Abd: NABS, Soft. Nondistended, Nontender Exts: Brisk capillary refill, warm and well perfused.  Depression screen [facility] 2/9 05/02/2016 04/27/2015  Decreased Interest 0 0  Down, Depressed, Hopeless 0 0  PHQ - 2 Score 0 0     No results found for this or any previous visit (from the past 72 hour(s)). No results found.    Assessment and Plan: 71 y.o. male with well adult. Obtain basic fasting labs as well as screening for prostate cancer with PSA. Tdap vaccine given. Recheck in 1 year or sooner if needed.  Orders  Placed This Encounter  Procedures  . Tdap vaccine greater than or equal to 7yo IM  . Lipid Panel w/reflex Direct LDL  . PSA  . Hepatitis C antibody   No orders of the defined types were placed in this encounter.    Discussed warning signs or symptoms. Please see discharge instructions. Patient expresses understanding.

## 2016-05-03 LAB — LIPID PANEL W/REFLEX DIRECT LDL
Cholesterol: 199 mg/dL (ref <200)
HDL: 96 mg/dL (ref >40)
LDL-CHOLESTEROL: 84 mg/dL
NON-HDL CHOLESTEROL (CALC): 103 mg/dL (ref <130)
TRIGLYCERIDES: 94 mg/dL (ref <150)
Total CHOL/HDL Ratio: 2.1 Ratio (ref <5.0)

## 2016-05-03 LAB — PSA: PSA: 4.3 ng/mL — AB (ref <=4.0)

## 2016-05-04 ENCOUNTER — Telehealth: Payer: Self-pay | Admitting: Family Medicine

## 2016-05-04 DIAGNOSIS — R972 Elevated prostate specific antigen [PSA]: Secondary | ICD-10-CM

## 2016-05-04 LAB — HEPATITIS C ANTIBODY: HCV AB: NEGATIVE

## 2016-05-04 NOTE — Telephone Encounter (Signed)
I called pt with recent lab results.  Plan to refer to urololgy for increasing PSA

## 2016-07-17 ENCOUNTER — Ambulatory Visit (HOSPITAL_BASED_OUTPATIENT_CLINIC_OR_DEPARTMENT_OTHER): Payer: BLUE CROSS/BLUE SHIELD

## 2016-07-17 ENCOUNTER — Telehealth: Payer: Self-pay

## 2016-07-17 ENCOUNTER — Ambulatory Visit (INDEPENDENT_AMBULATORY_CARE_PROVIDER_SITE_OTHER): Payer: BLUE CROSS/BLUE SHIELD | Admitting: Family Medicine

## 2016-07-17 VITALS — BP 131/82 | HR 62 | Temp 98.1°F

## 2016-07-17 DIAGNOSIS — R319 Hematuria, unspecified: Secondary | ICD-10-CM

## 2016-07-17 LAB — POCT URINALYSIS DIPSTICK
Glucose, UA: NEGATIVE
KETONES UA: NEGATIVE
LEUKOCYTES UA: NEGATIVE
NITRITE UA: NEGATIVE
PH UA: 7 (ref 5.0–8.0)
PROTEIN UA: 100
Spec Grav, UA: 1.025 (ref 1.010–1.025)
Urobilinogen, UA: 0.2 E.U./dL

## 2016-07-17 LAB — CBC WITH DIFFERENTIAL/PLATELET
BASOS PCT: 1 %
Basophils Absolute: 55 cells/uL (ref 0–200)
EOS ABS: 55 {cells}/uL (ref 15–500)
EOS PCT: 1 %
HCT: 41.7 % (ref 38.5–50.0)
Hemoglobin: 13.2 g/dL (ref 13.2–17.1)
Lymphocytes Relative: 33 %
Lymphs Abs: 1815 cells/uL (ref 850–3900)
MCH: 26.2 pg — ABNORMAL LOW (ref 27.0–33.0)
MCHC: 31.7 g/dL — ABNORMAL LOW (ref 32.0–36.0)
MCV: 82.9 fL (ref 80.0–100.0)
MONOS PCT: 12 %
MPV: 10.1 fL (ref 7.5–12.5)
Monocytes Absolute: 660 cells/uL (ref 200–950)
NEUTROS ABS: 2915 {cells}/uL (ref 1500–7800)
Neutrophils Relative %: 53 %
PLATELETS: 262 10*3/uL (ref 140–400)
RBC: 5.03 MIL/uL (ref 4.20–5.80)
RDW: 15.7 % — ABNORMAL HIGH (ref 11.0–15.0)
WBC: 5.5 10*3/uL (ref 3.8–10.8)

## 2016-07-17 LAB — COMPLETE METABOLIC PANEL WITH GFR
AG Ratio: 1.8 Ratio (ref 1.0–2.5)
ALBUMIN: 4.2 g/dL (ref 3.6–5.1)
ALK PHOS: 39 U/L — AB (ref 40–115)
ALT: 13 U/L (ref 9–46)
AST: 16 U/L (ref 10–35)
BUN / CREAT RATIO: 31.8 ratio — AB (ref 6–22)
BUN: 28 mg/dL — AB (ref 7–25)
CHLORIDE: 106 mmol/L (ref 98–110)
CO2: 27 mmol/L (ref 20–31)
Calcium: 9.1 mg/dL (ref 8.6–10.3)
Creat: 0.88 mg/dL (ref 0.70–1.25)
GFR, EST NON AFRICAN AMERICAN: 88 mL/min (ref >=60)
GFR, Est African American: >89 mL/min (ref >=60)
Globulin: 2.3 g/dL (ref 1.9–3.7)
Glucose, Bld: 99 mg/dL (ref 65–99)
POTASSIUM: 4.3 mmol/L (ref 3.5–5.3)
SODIUM: 141 mmol/L (ref 135–146)
TOTAL PROTEIN: 6.5 g/dL (ref 6.1–8.1)
Total Bilirubin: 0.3 mg/dL (ref 0.2–1.2)

## 2016-07-17 NOTE — Progress Notes (Signed)
Pt came into clinic today after noticing blood in his urine. Reports some lower abdominal pain, no trauma. POC urine dipstick, urine culture, urine micro, and CT abd/pel ordered per Provider request. Pt advised to contact clinic or go to ED if this gets worse. Pt is scheduled with urology in June.

## 2016-07-17 NOTE — Telephone Encounter (Signed)
Pt called stating he has blood in his urine starting today, no pain but does notice pressure. Pt states as the day goes he is noticing more blood in urine. Pt does have a referral in the system to Urology for elevated PSA, spoke with Dr. Madilyn Fireman who states pt should come in for UA to rule out infection and further plan will be worked out a that point.

## 2016-07-17 NOTE — Progress Notes (Signed)
Urinalysis positive for gross blood. We'll send for microscopic review to better quantify. We'll also go ahead and schedule for pelvic ultrasound with and without contrast for better evaluation since his appointment with urology is not until June.

## 2016-07-18 ENCOUNTER — Telehealth: Payer: Self-pay | Admitting: Family Medicine

## 2016-07-18 LAB — URINALYSIS, MICROSCOPIC ONLY
Bacteria, UA: NONE SEEN [HPF]
CASTS: NONE SEEN [LPF]
Crystals: NONE SEEN [HPF]
Squamous Epithelial / LPF: NONE SEEN [HPF] (ref <=5)
Yeast: NONE SEEN [HPF]

## 2016-07-18 NOTE — Telephone Encounter (Signed)
Error

## 2016-07-18 NOTE — Telephone Encounter (Signed)
-----   Message from Day sent at 07/18/2016  2:54 PM EDT ----- Regarding: CT ABD P[facility],  I just wanted to let you know that the patient above, Jonathon Taylor, did not get his CT done yesterday due to financial cost. He did advise he would wait to see about his lab work and reschedule if necessary.   Thank you, Destiny - [facility]

## 2016-07-19 LAB — URINE CULTURE: Organism ID, Bacteria: NO GROWTH

## 2016-07-23 ENCOUNTER — Telehealth: Payer: Self-pay

## 2016-07-23 DIAGNOSIS — R310 Gross hematuria: Secondary | ICD-10-CM

## 2016-09-13 DIAGNOSIS — R972 Elevated prostate specific antigen [PSA]: Secondary | ICD-10-CM | POA: Diagnosis not present

## 2016-09-13 DIAGNOSIS — R310 Gross hematuria: Secondary | ICD-10-CM | POA: Diagnosis not present

## 2016-09-13 LAB — PSA: PSA: 2.63

## 2016-11-13 ENCOUNTER — Other Ambulatory Visit: Payer: Self-pay

## 2016-11-13 MED ORDER — TRIAZOLAM 0.25 MG PO TABS: 0.1250 mg | ORAL_TABLET | Freq: Every evening | ORAL | 4 refills | Status: DC | PRN

## 2016-11-13 NOTE — Progress Notes (Signed)
Pt request refill on pended medication. Please approve if appropriate.

## 2016-11-13 NOTE — Progress Notes (Signed)
Refilled

## 2017-01-08 ENCOUNTER — Ambulatory Visit (INDEPENDENT_AMBULATORY_CARE_PROVIDER_SITE_OTHER): Payer: BLUE CROSS/BLUE SHIELD | Admitting: Family Medicine

## 2017-01-08 ENCOUNTER — Encounter: Payer: Self-pay | Admitting: Family Medicine

## 2017-01-08 VITALS — BP 149/74 | HR 65 | Wt 164.0 lb

## 2017-01-08 DIAGNOSIS — Z23 Encounter for immunization: Secondary | ICD-10-CM

## 2017-01-08 DIAGNOSIS — K4021 Bilateral inguinal hernia, without obstruction or gangrene, recurrent: Secondary | ICD-10-CM | POA: Diagnosis not present

## 2017-01-08 DIAGNOSIS — G4700 Insomnia, unspecified: Secondary | ICD-10-CM | POA: Diagnosis not present

## 2017-01-08 MED ORDER — ZOSTER VAC RECOMB ADJUVANTED 50 MCG/0.5ML IM SUSR: 0.5000 mL | Freq: Once | INTRAMUSCULAR | 1 refills | Status: AC

## 2017-01-08 MED ORDER — TRIAZOLAM 0.25 MG PO TABS: 0.1250 mg | ORAL_TABLET | Freq: Every evening | ORAL | 4 refills | Status: DC | PRN

## 2017-01-08 NOTE — Patient Instructions (Addendum)
Thank you for coming in today. You should hear from [facility] soon about an appointment.  If you do not hear anything in the next week let me know.    Inguinal Hernia, Adult An inguinal hernia is when fat or the intestines push through the area where the leg meets the lower abdomen (groin) and create a rounded lump (bulge). This condition develops over time. There are three types of inguinal hernias. These types include:  Hernias that can be pushed back into the belly (are reducible).  Hernias that are not reducible (are incarcerated).  Hernias that are not reducible and lose their blood supply (are strangulated). This type of hernia requires emergency surgery.  What are the causes? This condition is caused by having a weak spot in the muscles or tissue. This weakness lets the hernia poke through. This condition can be triggered by:  Suddenly straining the muscles of the lower abdomen.  Lifting heavy objects.  Straining to have a bowel movement. Difficult bowel movements (constipation) can lead to this.  Coughing.  What increases the risk? This condition is more likely to develop in:  Men.  Pregnant women.  People who: ? Are overweight. ? Work in jobs that require long periods of standing or heavy lifting. ? Have had an inguinal hernia before. ? Smoke or have lung disease. These factors can lead to long-lasting (chronic) coughing.  What are the signs or symptoms? Symptoms can depend on the size of the hernia. Often, a small inguinal hernia has no symptoms. Symptoms of a larger hernia include:  A lump in the groin. This is easier to see when the person is standing. It might not be visible when he or she is lying down.  Pain or burning in the groin. This occurs especially when lifting, straining, or coughing.  A dull ache or a feeling of pressure in the groin.  A lump in the scrotum in men.  Symptoms of a strangulated inguinal hernia can include:  A  bulge in the groin that is very painful and tender to the touch.  A bulge that turns red or purple.  Fever, nausea, and vomiting.  The inability to have a bowel movement or to pass gas.  How is this diagnosed? This condition is diagnosed with a medical history and physical exam. Your health care provider may feel your groin area and ask you to cough. How is this treated? Treatment for this condition varies depending on the size of your hernia and whether you have symptoms. If you do not have symptoms, your health care provider may have you watch your hernia carefully and come in for follow-up visits. If your hernia is larger or if you have symptoms, your treatment will include surgery. Follow these instructions at home: Lifestyle  Drink enough fluid to keep your urine clear or pale yellow.  Eat a diet that includes a lot of fiber. Eat plenty of fruits, vegetables, and whole grains. Talk with your health care provider if you have questions.  Avoid lifting heavy objects.  Avoid standing for long periods of time.  Do not use tobacco products, including cigarettes, chewing tobacco, or e-cigarettes. If you need help quitting, ask your health care provider.  Maintain a healthy weight. General instructions  Do not try to force the hernia back in.  Watch your hernia for any changes in color or size. Let your health care provider know if any changes occur.  Take over-the-counter and prescription medicines only as told by  your health care provider.  Keep all follow-up visits as told by your health care provider. This is important. Contact a health care provider if:  You have a fever.  You have new symptoms.  Your symptoms get worse. Get help right away if:  You have pain in the groin that suddenly gets worse.  A bulge in the groin gets bigger suddenly and does not go down.  You are a man and you have a sudden pain in the scrotum, or the size of your scrotum suddenly  changes.  A bulge in the groin area becomes red or purple and is painful to the touch.  You have nausea or vomiting that does not go away.  You feel your heart beating a lot more quickly than normal.  You cannot have a bowel movement or pass gas. This information is not intended to replace advice given to you by your health care provider. Make sure you discuss any questions you have with your health care provider. Document Released: 07/29/2008 Document Revised: 08/18/2015 Document Reviewed: 01/20/2014 Elsevier Interactive Patient Education  2018 [employer].

## 2017-01-08 NOTE — Progress Notes (Signed)
Jonathon Taylor is a 71 y.o. male who presents to [facility] today for hernia. Over the past several months patient has noted bulging in his groin suspicious for inguinal hernia. He notes this is worse when he is standing and lifting objects. He is able to push the bulge and back in without significant pain. He notes that he typically will wear a lifting belt which does help to contain what he suspects is a hernia. He feels well otherwise and denies any urinary or fecal symptoms. No fevers or chills vomiting or diarrhea.  Insomnia: Patient takes Halcion occasionally for insomnia. He notes this helps to allow him to fall asleep very well.  He denies any significant side effects such as confusion or lethargy and feels well otherwise.   Past Medical History:  Diagnosis Date  . GERD (gastroesophageal reflux disease)   . H/O cardiac catheterization 2004   negative;Dr Tollie Eth  . Personal history of colonic polyps 08/12/2015  . Seasonal allergies   . Skin cancer    Dr [name]   Past Surgical History:  Procedure Laterality Date  . arthroscopic knee surgery  1995  . BASAL CELL CARCINOMA EXCISION     2000-2017  . CARDIAC CATHETERIZATION     Social History  Substance Use Topics  . Smoking status: Never Smoker  . Smokeless tobacco: Never Used  . Alcohol use 0.0 oz/week     Comment: 2 glasses of red wine nightly   family history includes Heart disease in his father, maternal grandfather, mother, and sister.  ROS as above:  Medications: Current Outpatient Prescriptions  Medication Sig Dispense Refill  . albuterol (PROVENTIL HFA;VENTOLIN HFA) 108 (90 Base) MCG/ACT inhaler Inhale 2 puffs into the lungs every 6 (six) hours as needed for wheezing. (Patient not taking: Reported on 07/17/2016) 1 Inhaler 11  . fluticasone (FLONASE) 50 MCG/ACT nasal spray Place 2  sprays into both nostrils daily. (Patient not taking: Reported on 05/02/2016) 16 g 11  . Fluticasone-Salmeterol (ADVAIR) 100-50 MCG/DOSE AEPB Inhale 1 puff into the lungs 2 (two) times daily. 1 each 11  . OVER THE COUNTER MEDICATION OTC Ibuprofen prn    . triazolam (HALCION) 0.25 MG tablet Take 0.5 tablets (0.125 mg total) by mouth at bedtime as needed for sleep. 30 tablet 4  . Zoster Vaccine Adjuvanted [facility]) injection Inject 0.5 mLs into the muscle once. 0.5 mL 1   No current facility-administered medications for this visit.    No Known Allergies  Health Maintenance Health Maintenance  Topic Date Due  . INFLUENZA VACCINE  10/24/2016  . COLONOSCOPY  08/08/2018  . TETANUS/TDAP  05/02/2026  . Hepatitis C Screening  Completed  . PNA vac Low Risk Adult  Completed     Exam:  BP (!) 149/74   Pulse 65   Wt 164 lb (74.4 kg)   BMI 24.57 kg/m  Gen: Well NAD HEENT: EOMI,  MMM Lungs: Normal work of breathing. CTABL Heart: RRR no MRG Abd: NABS, Soft. Nondistended, Nontender Slight bulge and bilateral inguinal areas. No obvious inguinal hernia present today on exam. Testicles are descended bilaterally and nontender. Exts: Brisk capillary refill, warm and well perfused.    No results found for this or any previous visit (from the past 72 hour(s)). No results found.    Assessment and Plan: 71 y.o. male with presumed bilateral inguinal hernia. Patient certainly has symptoms and is doing pretty well able to reduce  themselves. I think is reasonable to talk with Gen. surgery. He may benefit from a early elective repair versus a delayed later more emergent repair. Referral to general surgery placed.  Insomnia: Doing well with Halcion. Medication refill. Discussed risks of this medication over 41 years old.   Orders Placed This Encounter  Procedures  . Flu vaccine HIGH DOSE PF  . Ambulatory referral to General Surgery    Referral Priority:   Routine    Referral Type:   Surgical     Referral Reason:   Specialty Services Required    Requested Specialty:   General Surgery    Number of Visits Requested:   1   Meds ordered this encounter  Medications  . Zoster Vaccine Adjuvanted [facility]) injection    Sig: Inject 0.5 mLs into the muscle once.    Dispense:  0.5 mL    Refill:  1  . triazolam (HALCION) 0.25 MG tablet    Sig: Take 0.5 tablets (0.125 mg total) by mouth at bedtime as needed for sleep.    Dispense:  30 tablet    Refill:  4     Discussed warning signs or symptoms. Please see discharge instructions. Patient expresses understanding.

## 2017-01-24 ENCOUNTER — Ambulatory Visit: Payer: Self-pay | Admitting: Surgery

## 2017-01-24 DIAGNOSIS — K4020 Bilateral inguinal hernia, without obstruction or gangrene, not specified as recurrent: Secondary | ICD-10-CM | POA: Diagnosis not present

## 2017-01-24 NOTE — H&P (Signed)
  Jonathon Taylor 01/24/2017 9:43 AM Location: [facility] Patient #: [id number] DOB: [birth year] Undefined / Language: Jonathon Taylor / Race: White Male  History of Present Illness (Rayley Gao A. 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 They do not cause him pain per se but he is very attentive to reducing them after any strenuous activity and has been wearing a belt for support. No urinary or GI symptoms, no episodes of incarceration or strangulation. He has a history of spondylolisthesis and has been doing a variety of physical therapy exercises to strengthen his core, in addition to which he enjoys gardening and landscaping which can be pretty strenuous. He is worried about the vulnerability of the hernias to incarceration and is interested in getting them repaired sooner than later. He does not smoke. No prior abdominal surgical history.  The patient is a 71 year old male.   Allergies (Tanisha A. Owens Shark, [place]; 01/24/2017 9:45 AM) No Known Drug Allergies 01/24/2017 Allergies Reconciled  Medication History (Tanisha A. Owens Shark, [place]; 01/24/2017 9:45 AM) Triazolam (0.25MG  Tablet, Oral) Active. Advair Diskus (100-50MCG/DOSE Aero Pow Br Act, Inhalation) Active. Fluticasone Propionate (Nasal) (50MCG/ACT Suspension, Nasal) Active. Medications Reconciled     Review of Systems (Channell Quattrone A. Kae Heller MD; 01/24/2017 10:45 AM) All other systems negative  Vitals (Tanisha A. Brown RMA; 01/24/2017 9:45 AM) 01/24/2017 9:44 AM Weight: 165 lb Height: 68in Body Surface Area: 1.88 m Body Mass Index: 25.09 kg/m  Temp.: 98.75F  Pulse: 98 (Regular)  BP: 124/78 (Sitting, Left Arm, Standard)      Physical Exam (Jayda White A. Kae Heller MD; 01/24/2017 10:49 AM)  General Note: alert and well appearing  Integumentary Note: Warm and dry  Head and Neck Note: no mass or  thyromegaly  Eye Note: No scleral icterus. Extra ocular motions intact.  ENMT Note: Moist mucous membranes, dentition intact  Chest and Lung Exam Note: Unlabored respirations, clear bilaterally  Cardiovascular Note: Regular rate and rhythm, no pedal edema  Abdomen Note: Soft, nontender nondistended. No mass or organomegaly. He has bilateral reducible direct inguinal hernias. No scar or umbilical hernia.  Neurologic Note: Grossly intact, normal gait  Neuropsychiatric Note: Normal mood and affect. Appropriate insight.  Musculoskeletal Note: Strength symmetrical throughout, no deformity    Assessment & Plan (Kamron Vanwyhe A. Kae Heller MD; 01/24/2017 10:51 AM)  BILATERAL INGUINAL HERNIA WITHOUT OBSTRUCTION OR GANGRENE, RECURRENCE NOT SPECIFIED (K40.20) Story: These are minimally symptomatic, but the patient is bothered by them due to worries about incarceration. We had a long discussion about the natural history of hernias and the size and symptoms to be aware of. I discussed with him that laparoscopic bilateral inguinal hernia repair with mesh is what I recommend. I discussed the technique of the surgery, risks of bleeding, infection, pain, scarring, intra-abdominal injury, chronic groin pain and hernia recurrence. He is understanding. He had several questions all which were answered. Particularly he is to make sure that he be able to continue with his physical therapy for spondylolisthesis as this has been tremendously helped to him. We discussed doing that when his pain is well controlled. We'll get him scheduled in the coming weeks, he would like to have surgery before Christmas.

## 2017-03-01 ENCOUNTER — Telehealth: Payer: Self-pay | Admitting: Family Medicine

## 2017-03-01 DIAGNOSIS — R972 Elevated prostate specific antigen [PSA]: Secondary | ICD-10-CM

## 2017-03-01 DIAGNOSIS — E785 Hyperlipidemia, unspecified: Secondary | ICD-10-CM

## 2017-03-01 LAB — CBC WITH DIFFERENTIAL/PLATELET
BASOS PCT: 1 %
Basophils Absolute: 40 cells/uL (ref 0–200)
EOS PCT: 3 %
Eosinophils Absolute: 120 cells/uL (ref 15–500)
HCT: 42.3 % (ref 38.5–50.0)
Hemoglobin: 13.4 g/dL (ref 13.2–17.1)
Lymphs Abs: 1784 cells/uL (ref 850–3900)
MCH: 26.3 pg — ABNORMAL LOW (ref 27.0–33.0)
MCHC: 31.7 g/dL — ABNORMAL LOW (ref 32.0–36.0)
MCV: 82.9 fL (ref 80.0–100.0)
MONOS PCT: 15.3 %
MPV: 10.7 fL (ref 7.5–12.5)
Neutro Abs: 1444 cells/uL — ABNORMAL LOW (ref 1500–7800)
Neutrophils Relative %: 36.1 %
PLATELETS: 267 10*3/uL (ref 140–400)
RBC: 5.1 10*6/uL (ref 4.20–5.80)
RDW: 13.2 % (ref 11.0–15.0)
TOTAL LYMPHOCYTE: 44.6 %
WBC: 4 10*3/uL (ref 3.8–10.8)
WBCMIX: 612 {cells}/uL (ref 200–950)

## 2017-03-01 LAB — LIPID PANEL W/REFLEX DIRECT LDL
Cholesterol: 221 mg/dL — ABNORMAL HIGH (ref <200)
HDL: 106 mg/dL (ref >40)
LDL CHOLESTEROL (CALC): 100 mg/dL — AB
Non-HDL Cholesterol (Calc): 115 mg/dL (calc) (ref <130)
Total CHOL/HDL Ratio: 2.1 (calc) (ref <5.0)
Triglycerides: 61 mg/dL (ref <150)

## 2017-03-01 LAB — COMPLETE METABOLIC PANEL WITH GFR
AG RATIO: 1.7 (calc) (ref 1.0–2.5)
ALBUMIN MSPROF: 4 g/dL (ref 3.6–5.1)
ALT: 12 U/L (ref 9–46)
AST: 15 U/L (ref 10–35)
Alkaline phosphatase (APISO): 39 U/L — ABNORMAL LOW (ref 40–115)
BILIRUBIN TOTAL: 0.4 mg/dL (ref 0.2–1.2)
BUN: 20 mg/dL (ref 7–25)
CHLORIDE: 108 mmol/L (ref 98–110)
CO2: 29 mmol/L (ref 20–32)
Calcium: 9 mg/dL (ref 8.6–10.3)
Creat: 0.84 mg/dL (ref 0.70–1.25)
GFR, EST AFRICAN AMERICAN: 104 mL/min/{1.73_m2} (ref >=60)
GFR, Est Non African American: 89 mL/min/{1.73_m2} (ref >=60)
GLOBULIN: 2.3 g/dL (ref 1.9–3.7)
GLUCOSE: 103 mg/dL — AB (ref 65–99)
Potassium: 4.4 mmol/L (ref 3.5–5.3)
Sodium: 142 mmol/L (ref 135–146)
TOTAL PROTEIN: 6.3 g/dL (ref 6.1–8.1)

## 2017-03-01 LAB — PSA: PSA: 3.4 ng/mL (ref <=4.0)

## 2017-03-01 NOTE — Telephone Encounter (Signed)
Getting labs prior to Urology visit with Dr Alyson Ingles on 12/20 and prior lab hernia repair on 12/18.  Will send results to Dr Alyson Ingles at [facility], and Dr Kae Heller at [place].

## 2017-03-08 NOTE — Patient Instructions (Addendum)
Jonathon Taylor  03/08/2017   Your procedure is scheduled on: 03-12-17  Report to [facility] Main  Entrance Take [place]  elevators to 3rd floor to  [place] at 530AM.   Call this number if you have problems the morning of surgery [phone]    Remember: ONLY 1 PERSON MAY GO WITH YOU TO SHORT STAY TO GET  READY MORNING OF YOUR SURGERY.  Do not eat food or drink liquids :After Midnight.      Take these medicines the morning of surgery with: INHALER AND NASAL SPRAY IF NEEDED ([place])                                 You may not have any metal on your body including hair pins and              piercings  Do not wear jewelry, make-up, lotions, powders or perfumes, deodorant                     Men may shave face and neck.   Do not bring valuables to the hospital. [place].  Contacts, dentures or bridgework may not be worn into surgery.  Leave suitcase in the car. After surgery it may be brought to your room.                 Please read over the following fact sheets you were given: _____________________________________________________________________            [facility] - Preparing for Surgery Before surgery, you can play an important role.  Because skin is not sterile, your skin needs to be as free of germs as possible.  You can reduce the number of germs on your skin by washing with CHG (chlorahexidine gluconate) soap before surgery.  CHG is an antiseptic cleaner which kills germs and bonds with the skin to continue killing germs even after washing. Please DO NOT use if you have an allergy to CHG or antibacterial soaps.  If your skin becomes reddened/irritated stop using the CHG and inform your nurse when you arrive at Short Stay. Do not shave (including legs and underarms) for at least 48 hours prior to the first CHG shower.  You may shave your face/neck. Please follow these  instructions carefully:  1.  Shower with CHG Soap the night before surgery and the  morning of Surgery.  2.  If you choose to wash your hair, wash your hair first as usual with your  normal  shampoo.  3.  After you shampoo, rinse your hair and body thoroughly to remove the  shampoo.                           4.  Use CHG as you would any other liquid soap.  You can apply chg directly  to the skin and wash                       Gently with a scrungie or clean washcloth.  5.  Apply the CHG Soap to your body ONLY FROM THE NECK DOWN.   Do not use on  face/ open                           Wound or open sores. Avoid contact with eyes, ears mouth and genitals (private parts).                       Wash face,  Genitals (private parts) with your normal soap.             6.  Wash thoroughly, paying special attention to the area where your surgery  will be performed.  7.  Thoroughly rinse your body with warm water from the neck down.  8.  DO NOT shower/wash with your normal soap after using and rinsing off  the CHG Soap.                9.  Pat yourself dry with a clean towel.            10.  Wear clean pajamas.            11.  Place clean sheets on your bed the night of your first shower and do not  sleep with pets. Day of Surgery : Do not apply any lotions/deodorants the morning of surgery.  Please wear clean clothes to the hospital/surgery center.  FAILURE TO FOLLOW THESE INSTRUCTIONS MAY RESULT IN THE CANCELLATION OF YOUR SURGERY PATIENT SIGNATURE_________________________________  NURSE SIGNATURE__________________________________  ________________________________________________________________________

## 2017-03-11 ENCOUNTER — Other Ambulatory Visit: Payer: Self-pay

## 2017-03-11 ENCOUNTER — Encounter (HOSPITAL_COMMUNITY)
Admission: RE | Admit: 2017-03-11 | Discharge: 2017-03-11 | Disposition: A | Discharge status: Home / Self Care | Payer: BLUE CROSS/BLUE SHIELD | Source: Ambulatory Visit | Attending: Surgery | Admitting: Surgery

## 2017-03-11 ENCOUNTER — Encounter (HOSPITAL_COMMUNITY): Payer: Self-pay

## 2017-03-11 ENCOUNTER — Encounter (HOSPITAL_COMMUNITY): Payer: Self-pay | Admitting: Anesthesiology

## 2017-03-11 DIAGNOSIS — J45909 Unspecified asthma, uncomplicated: Secondary | ICD-10-CM | POA: Diagnosis not present

## 2017-03-11 DIAGNOSIS — K4020 Bilateral inguinal hernia, without obstruction or gangrene, not specified as recurrent: Secondary | ICD-10-CM | POA: Diagnosis not present

## 2017-03-11 DIAGNOSIS — M4310 Spondylolisthesis, site unspecified: Secondary | ICD-10-CM | POA: Diagnosis not present

## 2017-03-11 DIAGNOSIS — Z7951 Long term (current) use of inhaled steroids: Secondary | ICD-10-CM | POA: Diagnosis not present

## 2017-03-11 DIAGNOSIS — Z79899 Other long term (current) drug therapy: Secondary | ICD-10-CM | POA: Diagnosis not present

## 2017-03-11 NOTE — Anesthesia Preprocedure Evaluation (Addendum)
Anesthesia Evaluation  Patient identified by MRN, date of birth, ID band Patient awake    Airway Mallampati: I       Dental no notable dental hx. (+) Teeth Intact   Pulmonary asthma ,    Pulmonary exam normal breath sounds clear to auscultation       Cardiovascular negative cardio ROS Normal cardiovascular exam Rhythm:Regular Rate:Normal     Neuro/Psych negative neurological ROS  negative psych ROS   GI/Hepatic Neg liver ROS,   Endo/Other  negative endocrine ROS  Renal/GU negative Renal ROS     Musculoskeletal negative musculoskeletal ROS (+)   Abdominal Normal abdominal exam  (+)   Peds  Hematology negative hematology ROS (+)   Anesthesia Other Findings   Reproductive/Obstetrics                             Anesthesia Physical Anesthesia Plan  ASA: II  Anesthesia Plan: General   Post-op Pain Management:    Induction: Intravenous  PONV Risk Score and Plan: 4 or greater and Ondansetron, Dexamethasone, Scopolamine patch - Pre-op and Midazolam  Airway Management Planned: Oral ETT  Additional Equipment:   Intra-op Plan:   Post-operative Plan: Extubation in OR  Informed Consent: I have reviewed the patients History and Physical, chart, labs and discussed the procedure including the risks, benefits and alternatives for the proposed anesthesia with the patient or authorized representative who has indicated his/her understanding and acceptance.   Dental advisory given  Plan Discussed with: CRNA and Surgeon  Anesthesia Plan Comments:        Anesthesia Quick Evaluation

## 2017-03-12 ENCOUNTER — Encounter (HOSPITAL_COMMUNITY)
Admission: RE | Disposition: A | Discharge status: Home / Self Care | Payer: Self-pay | Source: Ambulatory Visit | Attending: Surgery

## 2017-03-12 ENCOUNTER — Ambulatory Visit (HOSPITAL_COMMUNITY): Payer: BLUE CROSS/BLUE SHIELD | Admitting: Anesthesiology

## 2017-03-12 ENCOUNTER — Ambulatory Visit (HOSPITAL_COMMUNITY)
Admission: RE | Admit: 2017-03-12 | Discharge: 2017-03-12 | Disposition: A | Discharge status: Home / Self Care | Payer: BLUE CROSS/BLUE SHIELD | Source: Ambulatory Visit | Attending: Surgery | Admitting: Surgery

## 2017-03-12 ENCOUNTER — Encounter (HOSPITAL_COMMUNITY): Payer: Self-pay | Admitting: Emergency Medicine

## 2017-03-12 DIAGNOSIS — Z7951 Long term (current) use of inhaled steroids: Secondary | ICD-10-CM | POA: Insufficient documentation

## 2017-03-12 DIAGNOSIS — M4310 Spondylolisthesis, site unspecified: Secondary | ICD-10-CM | POA: Diagnosis not present

## 2017-03-12 DIAGNOSIS — J45909 Unspecified asthma, uncomplicated: Secondary | ICD-10-CM | POA: Insufficient documentation

## 2017-03-12 DIAGNOSIS — K4020 Bilateral inguinal hernia, without obstruction or gangrene, not specified as recurrent: Secondary | ICD-10-CM | POA: Diagnosis not present

## 2017-03-12 DIAGNOSIS — Z79899 Other long term (current) drug therapy: Secondary | ICD-10-CM | POA: Diagnosis not present

## 2017-03-12 DIAGNOSIS — G4700 Insomnia, unspecified: Secondary | ICD-10-CM | POA: Diagnosis not present

## 2017-03-12 DIAGNOSIS — R972 Elevated prostate specific antigen [PSA]: Secondary | ICD-10-CM | POA: Diagnosis not present

## 2017-03-12 HISTORY — PX: INSERTION OF MESH: SHX5868

## 2017-03-12 HISTORY — PX: INGUINAL HERNIA REPAIR: SHX194

## 2017-03-12 SURGERY — REPAIR, HERNIA, INGUINAL, LAPAROSCOPIC
Anesthesia: General

## 2017-03-12 MED ORDER — EPHEDRINE 5 MG/ML INJ
INTRAVENOUS | Status: AC
  Filled 2017-03-12: qty 10

## 2017-03-12 MED ORDER — DEXAMETHASONE SODIUM PHOSPHATE 10 MG/ML IJ SOLN
INTRAMUSCULAR | Status: AC
  Filled 2017-03-12: qty 1

## 2017-03-12 MED ORDER — SUGAMMADEX SODIUM 200 MG/2ML IV SOLN
INTRAVENOUS | Status: AC
  Filled 2017-03-12: qty 2

## 2017-03-12 MED ORDER — ACETAMINOPHEN 500 MG PO TABS
1000.0000 mg | ORAL_TABLET | ORAL | Status: AC
  Administered 2017-03-12: 1000 mg via ORAL
  Filled 2017-03-12: qty 2

## 2017-03-12 MED ORDER — PROPOFOL 10 MG/ML IV BOLUS
INTRAVENOUS | Status: DC | PRN
  Administered 2017-03-12: 120 mg via INTRAVENOUS

## 2017-03-12 MED ORDER — FENTANYL CITRATE (PF) 100 MCG/2ML IJ SOLN
INTRAMUSCULAR | Status: AC
  Filled 2017-03-12: qty 2

## 2017-03-12 MED ORDER — DOCUSATE SODIUM 100 MG PO CAPS: 100.0000 mg | ORAL_CAPSULE | Freq: Two times a day (BID) | ORAL | 0 refills | Status: AC

## 2017-03-12 MED ORDER — CEFAZOLIN SODIUM-DEXTROSE 2-4 GM/100ML-% IV SOLN
INTRAVENOUS | Status: AC
  Filled 2017-03-12: qty 100

## 2017-03-12 MED ORDER — PROPOFOL 10 MG/ML IV BOLUS
INTRAVENOUS | Status: AC
  Filled 2017-03-12: qty 20

## 2017-03-12 MED ORDER — EPHEDRINE SULFATE-NACL 50-0.9 MG/10ML-% IV SOSY
PREFILLED_SYRINGE | INTRAVENOUS | Status: DC | PRN
  Administered 2017-03-12: 10 mg via INTRAVENOUS

## 2017-03-12 MED ORDER — ROCURONIUM BROMIDE 10 MG/ML (PF) SYRINGE
PREFILLED_SYRINGE | INTRAVENOUS | Status: DC | PRN
  Administered 2017-03-12: 20 mg via INTRAVENOUS
  Administered 2017-03-12: 50 mg via INTRAVENOUS

## 2017-03-12 MED ORDER — CHLORHEXIDINE GLUCONATE 4 % EX LIQD: 60.0000 mL | Freq: Once | CUTANEOUS | Status: DC

## 2017-03-12 MED ORDER — ROCURONIUM BROMIDE 50 MG/5ML IV SOSY
PREFILLED_SYRINGE | INTRAVENOUS | Status: AC
  Filled 2017-03-12: qty 5

## 2017-03-12 MED ORDER — LIDOCAINE-EPINEPHRINE 1 %-1:100000 IJ SOLN
INTRAMUSCULAR | Status: AC
  Filled 2017-03-12: qty 1

## 2017-03-12 MED ORDER — KETOROLAC TROMETHAMINE 30 MG/ML IJ SOLN: 30.0000 mg | Freq: Once | INTRAMUSCULAR | Status: DC | PRN

## 2017-03-12 MED ORDER — LIDOCAINE 2% (20 MG/ML) 5 ML SYRINGE
INTRAMUSCULAR | Status: DC | PRN
  Administered 2017-03-12: 100 mg via INTRAVENOUS

## 2017-03-12 MED ORDER — HYDROCODONE-ACETAMINOPHEN 5-325 MG PO TABS: 1.0000 | ORAL_TABLET | Freq: Four times a day (QID) | ORAL | 0 refills | Status: DC | PRN

## 2017-03-12 MED ORDER — SUCCINYLCHOLINE CHLORIDE 200 MG/10ML IV SOSY
PREFILLED_SYRINGE | INTRAVENOUS | Status: DC | PRN
  Administered 2017-03-12: 100 mg via INTRAVENOUS

## 2017-03-12 MED ORDER — DEXAMETHASONE SODIUM PHOSPHATE 10 MG/ML IJ SOLN
INTRAMUSCULAR | Status: DC | PRN
  Administered 2017-03-12: 10 mg via INTRAVENOUS

## 2017-03-12 MED ORDER — OXYCODONE HCL 5 MG PO TABS: 5.0000 mg | ORAL_TABLET | Freq: Once | ORAL | Status: DC | PRN

## 2017-03-12 MED ORDER — MIDAZOLAM HCL 2 MG/2ML IJ SOLN
INTRAMUSCULAR | Status: AC
Start: 2017-03-12 — End: ?
  Filled 2017-03-12: qty 2

## 2017-03-12 MED ORDER — LIDOCAINE 2% (20 MG/ML) 5 ML SYRINGE
INTRAMUSCULAR | Status: AC
Start: 2017-03-12 — End: ?
  Filled 2017-03-12: qty 5

## 2017-03-12 MED ORDER — LIDOCAINE-EPINEPHRINE 1 %-1:100000 IJ SOLN
INTRAMUSCULAR | Status: DC | PRN
  Administered 2017-03-12: 20 mL

## 2017-03-12 MED ORDER — FENTANYL CITRATE (PF) 100 MCG/2ML IJ SOLN
INTRAMUSCULAR | Status: DC | PRN
  Administered 2017-03-12 (×2): 50 ug via INTRAVENOUS

## 2017-03-12 MED ORDER — OXYCODONE HCL 5 MG/5ML PO SOLN
5.0000 mg | Freq: Once | ORAL | Status: DC | PRN
  Filled 2017-03-12: qty 5

## 2017-03-12 MED ORDER — BUPIVACAINE LIPOSOME 1.3 % IJ SUSP
20.0000 mL | Freq: Once | INTRAMUSCULAR | Status: AC
  Administered 2017-03-12: 20 mL
  Filled 2017-03-12: qty 20

## 2017-03-12 MED ORDER — SODIUM CHLORIDE 0.9 % IJ SOLN
INTRAMUSCULAR | Status: AC
  Filled 2017-03-12: qty 50

## 2017-03-12 MED ORDER — LACTATED RINGERS IV SOLN
INTRAVENOUS | Status: DC | PRN
  Administered 2017-03-12: 07:00:00 via INTRAVENOUS
  Administered 2017-03-12: 1000 mL

## 2017-03-12 MED ORDER — SUCCINYLCHOLINE CHLORIDE 200 MG/10ML IV SOSY
PREFILLED_SYRINGE | INTRAVENOUS | Status: AC
  Filled 2017-03-12: qty 10

## 2017-03-12 MED ORDER — SUGAMMADEX SODIUM 200 MG/2ML IV SOLN
INTRAVENOUS | Status: DC | PRN
  Administered 2017-03-12: 200 mg via INTRAVENOUS

## 2017-03-12 MED ORDER — ONDANSETRON HCL 4 MG/2ML IJ SOLN: 4.0000 mg | Freq: Once | INTRAMUSCULAR | Status: DC | PRN

## 2017-03-12 MED ORDER — ACETAMINOPHEN 325 MG PO TABS: 325.0000 mg | ORAL_TABLET | ORAL | Status: DC | PRN

## 2017-03-12 MED ORDER — FENTANYL CITRATE (PF) 100 MCG/2ML IJ SOLN
25.0000 ug | INTRAMUSCULAR | Status: DC | PRN
  Administered 2017-03-12 (×2): 50 ug via INTRAVENOUS

## 2017-03-12 MED ORDER — CEFAZOLIN SODIUM-DEXTROSE 2-4 GM/100ML-% IV SOLN
2.0000 g | INTRAVENOUS | Status: AC
  Administered 2017-03-12: 2 g via INTRAVENOUS

## 2017-03-12 MED ORDER — CELECOXIB 200 MG PO CAPS
200.0000 mg | ORAL_CAPSULE | ORAL | Status: AC
  Administered 2017-03-12: 200 mg via ORAL
  Filled 2017-03-12: qty 1

## 2017-03-12 MED ORDER — ONDANSETRON HCL 4 MG/2ML IJ SOLN
INTRAMUSCULAR | Status: AC
  Filled 2017-03-12: qty 2

## 2017-03-12 MED ORDER — GABAPENTIN 300 MG PO CAPS
300.0000 mg | ORAL_CAPSULE | ORAL | Status: AC
  Administered 2017-03-12: 300 mg via ORAL
  Filled 2017-03-12: qty 1

## 2017-03-12 MED ORDER — ACETAMINOPHEN 160 MG/5ML PO SOLN: 325.0000 mg | ORAL | Status: DC | PRN

## 2017-03-12 MED ORDER — ONDANSETRON HCL 4 MG/2ML IJ SOLN
INTRAMUSCULAR | Status: DC | PRN
  Administered 2017-03-12: 4 mg via INTRAVENOUS

## 2017-03-12 MED ORDER — MIDAZOLAM HCL 5 MG/5ML IJ SOLN
INTRAMUSCULAR | Status: DC | PRN
  Administered 2017-03-12: 2 mg via INTRAVENOUS

## 2017-03-12 MED ORDER — MEPERIDINE HCL 50 MG/ML IJ SOLN: 6.2500 mg | INTRAMUSCULAR | Status: DC | PRN

## 2017-03-12 MED ORDER — LIDOCAINE 2% (20 MG/ML) 5 ML SYRINGE
INTRAMUSCULAR | Status: AC
  Filled 2017-03-12: qty 5

## 2017-03-12 SURGICAL SUPPLY — 31 items
CABLE HIGH FREQUENCY MONO STRZ (ELECTRODE) ×3 IMPLANT
CHLORAPREP W/TINT 26ML (MISCELLANEOUS) ×3 IMPLANT
COVER SURGICAL LIGHT HANDLE (MISCELLANEOUS) ×3 IMPLANT
DECANTER SPIKE VIAL GLASS SM (MISCELLANEOUS) ×3 IMPLANT
DERMABOND ADVANCED (GAUZE/BANDAGES/DRESSINGS) ×1
DERMABOND ADVANCED .7 DNX12 (GAUZE/BANDAGES/DRESSINGS) ×2 IMPLANT
DEVICE SECURE STRAP 25 ABSORB (INSTRUMENTS) ×3 IMPLANT
ELECT REM PT RETURN 15FT ADLT (MISCELLANEOUS) ×3 IMPLANT
GLOVE BIO SURGEON STRL SZ 6 (GLOVE) ×6 IMPLANT
GLOVE INDICATOR 6.5 STRL GRN (GLOVE) ×3 IMPLANT
GOWN STRL REUS W/TWL LRG LVL3 (GOWN DISPOSABLE) ×9 IMPLANT
GOWN STRL REUS W/TWL XL LVL3 (GOWN DISPOSABLE) IMPLANT
GRASPER SUT TROCAR 14GX15 (MISCELLANEOUS) ×3 IMPLANT
IRRIG SUCT STRYKERFLOW 2 WTIP (MISCELLANEOUS)
IRRIGATION SUCT STRKRFLW 2 WTP (MISCELLANEOUS) IMPLANT
KIT BASIN OR (CUSTOM PROCEDURE TRAY) ×3 IMPLANT
MESH ULTRAPRO 3X6 7.6X15CM (Mesh General) ×6 IMPLANT
NEEDLE INSUFFLATION 14GA 120MM (NEEDLE) ×3 IMPLANT
NS IRRIG 1000ML POUR BTL (IV SOLUTION) ×3 IMPLANT
PAD POSITIONING PINK XL (MISCELLANEOUS) ×3 IMPLANT
SCISSORS LAP 5X35 DISP (ENDOMECHANICALS) ×3 IMPLANT
SLEEVE XCEL OPT CAN 5 100 (ENDOMECHANICALS) ×3 IMPLANT
SUT MNCRL AB 4-0 PS2 18 (SUTURE) ×3 IMPLANT
TOWEL OR 17X26 10 PK STRL BLUE (TOWEL DISPOSABLE) ×3 IMPLANT
TOWEL OR NON WOVEN STRL DISP B (DISPOSABLE) IMPLANT
TRAY FOLEY CATH SILVER 14FR (SET/KITS/TRAYS/PACK) IMPLANT
TRAY FOLEY W/METER SILVER 16FR (SET/KITS/TRAYS/PACK) ×3 IMPLANT
TRAY LAPAROSCOPIC (CUSTOM PROCEDURE TRAY) ×3 IMPLANT
TROCAR BLADELESS OPT 5 100 (ENDOMECHANICALS) ×3 IMPLANT
TROCAR XCEL 12X100 BLDLESS (ENDOMECHANICALS) ×3 IMPLANT
TUBING INSUF HEATED (TUBING) ×3 IMPLANT

## 2017-03-12 NOTE — Transfer of Care (Signed)
Immediate Anesthesia Transfer of Care Note  Patient: Jonathon Taylor  Procedure(s) Performed: LAPAROSCOPIC BILATERAL INGUINAL HERNIA WITH  MESH (Bilateral ) INSERTION OF MESH (N/A )  Patient Location: PACU  Anesthesia Type:General  Level of Consciousness: sedated  Airway & Oxygen Therapy: Patient Spontanous Breathing and Patient connected to face mask oxygen  Post-op Assessment: Report given to RN and Post -op Vital signs reviewed and stable  Post vital signs: Reviewed and stable  Last Vitals:  Vitals:   03/12/17 0604  BP: 138/84  Pulse: 62  Resp: 16  Temp: 36.6 C  SpO2: 97%    Last Pain:  Vitals:   03/12/17 0604  TempSrc: Oral         Complications: No apparent anesthesia complications

## 2017-03-12 NOTE — Anesthesia Procedure Notes (Signed)
Procedure Name: Intubation Date/Time: 03/12/2017 7:20 AM Performed by: Lind Covert, CRNA Pre-anesthesia Checklist: Emergency Drugs available, Suction available, Patient being monitored, Timeout performed and Patient identified Patient Re-evaluated:Patient Re-evaluated prior to induction Oxygen Delivery Method: Circle system utilized Preoxygenation: Pre-oxygenation with 100% oxygen Induction Type: IV induction Laryngoscope Size: Mac and 4 Grade View: Grade II Tube type: Oral Tube size: 7.5 mm Number of attempts: 1 Airway Equipment and Method: Stylet Placement Confirmation: ETT inserted through vocal cords under direct vision,  positive ETCO2 and breath sounds checked- equal and bilateral Secured at: 22 cm Tube secured with: Tape Dental Injury: Teeth and Oropharynx as per pre-operative assessment

## 2017-03-12 NOTE — Op Note (Signed)
Operative Note  Jonathon Taylor  [id number]  [id number]  03/12/2017   Surgeon: Vikki Ports A ConnorMD  Assistant: OR staff  Procedure performed: laparoscopic bilateral inguinal hernia repair ( TAPP, ultrapro mesh, securestrap vicryl tacker)  Preop diagnosis: Bilateral inguinal hernias Post-op diagnosis/intraop findings: bilateral indirect inguinal hernias  Specimens: no Retained items: no EBL: minimal cc Complications: none  Description of procedure: After obtaining informed consent the patient was taken to the operating room and placed supine on operating room table wheregeneral endotracheal anesthesia was initiated, preoperative antibiotics were administered, SCDs applied, and a formal timeout was performed. The abdomen was prepped and draped in the usual sterile fashion. Peritoneal access was gained with an infraumbilical Veress needle, insufflation to 15 mmHg ensued without incident. A 5 mm trocar and camera were introduced and gross inspection revealed no evidence of injury from our entry. He was noted to have bilateral indirect inguinal hernias. On the left there was chronically incarcerated epiploic appendage. Bilateral laparoscopic assisted taps blocks were performed using Exparel diluted with 1% lidocaine with epinephrine. After infiltration in the abdominal wall and subcutaneous tissues with this mixture, A 12 mm trocar was placed on the left and a 5 mm trocar was placed on the right and the patient was placed in steep Trendelenburg. Dissection began on the right side. Using cautery and blunt dissection, a peritoneal flap was developed spanning from the ASIS to the medial umbilical ligament. As the flap was developed, the hernia sac was reduced. The cord structures were notified and preserved. Once adequate space of been prepared and the Cooper's ligament exposed, a piece of ultra Pro mesh was brought to the field and trimmed to see the defect. This was introduced through the 12  mm trocar and directed to sit flush within the dissected plane. There was more than sufficient overlap of the hernia defect. The mesh was tacked to the Cooper's ligament using the secure strap tacker and then superiorly on either side of the inferior epigastric vessels. The peritoneal flap was then brought back up to cover the mesh, ensuring that the mesh remained flush within the field and did not buckle. The flap was reapposed to the anterior abdominal wall with the secure strap tacker. Upon completion, the mesh was completely covered by peritoneum. We then turned to the left side where in a similar fashion a peritoneal flap was developed from the ASIS to the medial umbilical ligament and the hernia sac was reduced with blunt dissection. Once adequate room had been made for the mesh, a piece of ultra Pro mesh was trimmed and introduced the 12 mm trocar and then directed to sit within the defect and is noted to sit nicely flush. This was again tacked to the Cooper's ligament with a secure strap tacker and similarly tacked superiorly on either side of the inferior epigastric vessels. The peritoneal flap was then brought back up to cover the mesh and secured to the anterior abdominal wall with a secure strap tacker completely covering the mesh. The abdomen was then once again surveyed and noted to be free of injury. The 12 mm trocar site was closed with a 0 Vicryl in the fascia using a PMI device. The abdomen was then desufflated and all trochars removed. The skin incisions were closed with subcuticular Monocryl and Dermabond. The patient was then awakened, extubated and taken to PACU in stable condition.   All counts were correct at the completion of the case.

## 2017-03-12 NOTE — Anesthesia Postprocedure Evaluation (Signed)
Anesthesia Post Note  Patient: GLENDAL CASSADAY  Procedure(s) Performed: LAPAROSCOPIC BILATERAL INGUINAL HERNIA WITH  MESH (Bilateral ) INSERTION OF MESH (N/A )     Patient location during evaluation: PACU Anesthesia Type: General Level of consciousness: awake and sedated Pain management: pain level controlled Vital Signs Assessment: post-procedure vital signs reviewed and stable Cardiovascular status: stable Postop Assessment: no apparent nausea or vomiting Anesthetic complications: no    Last Vitals:  Vitals:   03/12/17 0915 03/12/17 0930  BP: 132/76 123/78  Pulse: 66 64  Resp: 12 (!) 8  Temp:    SpO2: 100% 93%    Last Pain:  Vitals:   03/12/17 0920  TempSrc:   PainSc: 5    Pain Goal:                 Nickisha Hum JR,JOHN Meiko Ives

## 2017-03-12 NOTE — H&P (Signed)
  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 They do not cause him pain per se but he is very attentive to reducing them after any strenuous activity and has been wearing a belt for support. No urinary or GI symptoms, no episodes of incarceration or strangulation. He has a history of spondylolisthesis and has been doing a variety of physical therapy exercises to strengthen his core, in addition to which he enjoys gardening and landscaping which can be pretty strenuous. He is worried about the vulnerability of the hernias to incarceration and is interested in getting them repaired sooner than later. He does not smoke. No prior abdominal surgical history.  The patient is a 71 year old male.   Allergies  No Known Drug Allergies 01/24/2017 Allergies Reconciled  Medication History  Triazolam (0.25MG  Tablet, Oral) Active. Advair Diskus (100-50MCG/DOSE Aero Pow Br Act, Inhalation) Active. Fluticasone Propionate (Nasal) (50MCG/ACT Suspension, Nasal) Active. Medications Reconciled     Review of Systems  All other systems negative  Vitals:   03/12/17 0604  BP: 138/84  Pulse: 62  Resp: 16  Temp: 97.8 F (36.6 C)  SpO2: 97%         Physical Exam  General Note: alert and well appearing  Integumentary Note: Warm and dry  Head and Neck Note: no mass or thyromegaly  Eye Note: No scleral icterus. Extra ocular motions intact.  ENMT Note: Moist mucous membranes, dentition intact  Chest and Lung Exam Note: Unlabored respirations, clear bilaterally  Cardiovascular Note: Regular rate and rhythm, no pedal edema  Abdomen Note: Soft, nontender nondistended. No mass or organomegaly. He has bilateral reducible direct inguinal hernias. No scar or umbilical  hernia.  Neurologic Note: Grossly intact, normal gait  Neuropsychiatric Note: Normal mood and affect. Appropriate insight.  Musculoskeletal Note: Strength symmetrical throughout, no deformity    Assessment & Plan  BILATERAL INGUINAL HERNIA WITHOUT OBSTRUCTION OR GANGRENE, RECURRENCE NOT SPECIFIED (K40.20) Story: These are minimally symptomatic, but the patient is bothered by them due to worries about incarceration. We had a long discussion about the natural history of hernias and the size and symptoms to be aware of. I discussed with him that laparoscopic bilateral inguinal hernia repair with mesh is what I recommend. I discussed the technique of the surgery, risks of bleeding, infection, pain, scarring, intra-abdominal injury, chronic groin pain and hernia recurrence. He is understanding. He had several questions all which were answered. Particularly he is to make sure that he be able to continue with his physical therapy for spondylolisthesis as this has been tremendously helped to him. We discussed doing that when his pain is well controlled. We'll get him scheduled in the coming weeks, he would like to have surgery before Christmas.

## 2017-03-12 NOTE — Discharge Instructions (Signed)
General Anesthesia, Adult, Care After These instructions provide you with information about caring for yourself after your procedure. Your health care provider may also give you more specific instructions. Your treatment has been planned according to current medical practices, but problems sometimes occur. Call your health care provider if you have any problems or questions after your procedure. What can I expect after the procedure? After the procedure, it is common to have:  Vomiting.  A sore throat.  Mental slowness.  It is common to feel:  Nauseous.  Cold or shivery.  Sleepy.  Tired.  Sore or achy, even in parts of your body where you did not have surgery.  Follow these instructions at home: For at least 24 hours after the procedure:  Do not: ? Participate in activities where you could fall or become injured. ? Drive. ? Use heavy machinery. ? Drink alcohol. ? Take sleeping pills or medicines that cause drowsiness. ? Make important decisions or sign legal documents. ? Take care of children on your own.  Rest. Eating and drinking  If you vomit, drink water, juice, or soup when you can drink without vomiting.  Drink enough fluid to keep your urine clear or pale yellow.  Make sure you have little or no nausea before eating solid foods.  Follow the diet recommended by your health care provider. General instructions  Have a responsible adult stay with you until you are awake and alert.  Return to your normal activities as told by your health care provider. Ask your health care provider what activities are safe for you.  Take over-the-counter and prescription medicines only as told by your health care provider.  If you smoke, do not smoke without supervision.  Keep all follow-up visits as told by your health care provider. This is important. Contact a health care provider if:  You continue to have nausea or vomiting at home, and medicines are not helpful.  You  cannot drink fluids or start eating again.  You cannot urinate after 8-12 hours.  You develop a skin rash.  You have fever.  You have increasing redness at the site of your procedure. Get help right away if:  You have difficulty breathing.  You have chest pain.  You have unexpected bleeding.  You feel that you are having a life-threatening or urgent problem. This information is not intended to replace advice given to you by your health care provider. Make sure you discuss any questions you have with your health care provider. Document Released: 06/18/2000 Document Revised: 08/15/2015 Document Reviewed: 02/24/2015 Elsevier Interactive Patient Education  2018 [place]: POST OP INSTRUCTIONS  ######################################################################  EAT Gradually transition to a high fiber diet with a fiber supplement over the next few weeks after discharge.  Start with a pureed / full liquid diet (see below)  WALK Walk an hour a day.  Control your pain to do that.    CONTROL PAIN Control pain so that you can walk, sleep, tolerate sneezing/coughing, go up/down stairs.  HAVE A BOWEL MOVEMENT DAILY Keep your bowels regular to avoid problems.  OK to try a laxative to override constipation.  OK to use an antidairrheal to slow down diarrhea.  Call if not better after 2 tries  CALL IF YOU HAVE PROBLEMS/CONCERNS Call if you are still struggling despite following these instructions. Call if you have concerns not answered by these instructions  ######################################################################    1. DIET: Follow a light bland diet the first 24 hours after arrival  home, such as soup, liquids, crackers, etc.  Be sure to include lots of fluids daily.  Avoid fast food or heavy meals as your are more likely to get nauseated.  Eat a low fat the next few days after surgery.   2. Take your usually prescribed home medications  unless otherwise directed. 3. PAIN CONTROL: a. Pain is best controlled by a usual combination of three different methods TOGETHER: i. Ice/Heat ii. Over the counter pain medication iii. Prescription pain medication b. Most patients will experience some swelling and bruising around the incisions.  Ice packs or heating pads (30-60 minutes up to 6 times a day) will help. Use ice for the first few days to help decrease swelling and bruising, then switch to heat to help relax tight/sore spots and speed recovery.  Some people prefer to use ice alone, heat alone, alternating between ice & heat.  Experiment to what works for you.  Swelling and bruising can take several weeks to resolve.   c. It is helpful to take an over-the-counter pain medication regularly for the first few weeks.  Choose one of the following that works best for you: i. Naproxen (Aleve, etc)  Two 220mg  tabs twice a day ii. Ibuprofen (Advil, etc) Three 200mg  tabs four times a day (every meal & bedtime) iii. Acetaminophen (Tylenol, etc) 500-650mg  four times a day (every meal & bedtime) d. A  prescription for pain medication (such as oxycodone, hydrocodone, etc) should be given to you upon discharge.  Take your pain medication as prescribed.  i. If you are having problems/concerns with the prescription medicine (does not control pain, nausea, vomiting, rash, itching, etc), please call us [phone] to see if we need to switch you to a different pain medicine that will work better for you and/or control your side effect better. ii. If you need a refill on your pain medication, please contact your pharmacy.  They will contact our office to request authorization. Prescriptions will not be filled after 5 pm or on week-ends. 4. Avoid getting constipated.  Between the surgery and the pain medications, it is common to experience some constipation.  Increasing fluid intake and taking a fiber supplement (such as Metamucil, Citrucel, FiberCon,  MiraLax, etc) 1-2 times a day regularly will usually help prevent this problem from occurring.  A mild laxative (prune juice, Milk of Magnesia, MiraLax, etc) should be taken according to package directions if there are no bowel movements after 48 hours.   5. Watch out for diarrhea.  If you have many loose bowel movements, simplify your diet to bland foods & liquids for a few days.  Stop any stool softeners and decrease your fiber supplement.  Switching to mild anti-diarrheal medications (Kayopectate, Pepto Bismol) can help.  If this worsens or does not improve, please call us. 6. Wash / shower every day.  You may shower over the skin glue which is waterproof.  Continue to shower over incision(s) after the dressing is off. 7. Skin glue will fall off after about 2 weeks.  You may leave the incision open to air.  You may replace a dressing/Band-Aid to cover the incision for comfort if you wish.  8. ACTIVITIES as tolerated:   a. You may resume regular (light) daily activities beginning the next day--such as daily self-care, walking, climbing stairs--gradually increasing activities as tolerated.  If you can walk 30 minutes without difficulty, it is safe to try more intense activity such as jogging, treadmill, bicycling, low-impact aerobics, swimming, etc. b.  Save the most intensive and strenuous activity for last such as sit-ups, heavy lifting, contact sports, etc  Refrain from any heavy lifting or straining until you are 6 weeks out from surgery.   c. DO NOT PUSH THROUGH PAIN.  Let pain be your guide: If it hurts to do something, don't do it.  Pain is your body warning you to avoid that activity for another week until the pain goes down. d. You may drive when you are no longer taking prescription pain medication, you can comfortably wear a seatbelt, and you can safely maneuver your car and apply brakes. e. Dennis Bast may have sexual intercourse when it is comfortable.  9. FOLLOW UP in our office a. Please call [facility]  at [phone] to set up an appointment to see your surgeon in the office for a follow-up appointment approximately 2-3 weeks after your surgery. b. Make sure that you call for this appointment the day you arrive home to insure a convenient appointment time. 10. IF YOU HAVE DISABILITY OR FAMILY LEAVE FORMS, BRING THEM TO THE OFFICE FOR PROCESSING.  DO NOT GIVE THEM TO YOUR DOCTOR.   WHEN TO CALL us [phone]: 1. Poor pain control 2. Reactions / problems with new medications (rash/itching, nausea, etc)  3. Fever over 101.5 F (38.5 C) 4. Inability to urinate 5. Nausea and/or vomiting 6. Worsening swelling or bruising 7. Continued bleeding from incision. 8. Increased pain, redness, or drainage from the incision   The clinic staff is available to answer your questions during regular business hours (8:30am-5pm).  Please dont hesitate to call and ask to speak to one of our nurses for clinical concerns.   If you have a medical emergency, go to the nearest emergency room or call 911.  A surgeon from [facility] is always on call at the [facility], [place], [place], [place], [place]  [id number] ? MAIN: [phone] ? TOLL FREE: [phone] ?  FAX [phone] [email or web address]

## 2017-03-13 ENCOUNTER — Encounter (HOSPITAL_COMMUNITY): Payer: Self-pay | Admitting: Surgery

## 2017-03-14 DIAGNOSIS — R310 Gross hematuria: Secondary | ICD-10-CM | POA: Diagnosis not present

## 2017-03-14 DIAGNOSIS — R972 Elevated prostate specific antigen [PSA]: Secondary | ICD-10-CM | POA: Diagnosis not present

## 2017-03-24 ENCOUNTER — Encounter: Payer: Self-pay | Admitting: Family Medicine

## 2017-03-25 MED ORDER — AZITHROMYCIN 250 MG PO TABS: 250.0000 mg | ORAL_TABLET | Freq: Every day | ORAL | 0 refills | Status: DC

## 2017-05-02 ENCOUNTER — Encounter: Payer: Self-pay | Admitting: Family Medicine

## 2017-05-02 ENCOUNTER — Ambulatory Visit (INDEPENDENT_AMBULATORY_CARE_PROVIDER_SITE_OTHER): Payer: BLUE CROSS/BLUE SHIELD | Admitting: Family Medicine

## 2017-05-02 VITALS — BP 133/78 | HR 71 | Ht 68.5 in | Wt 161.0 lb

## 2017-05-02 DIAGNOSIS — Z0000 Encounter for general adult medical examination without abnormal findings: Secondary | ICD-10-CM | POA: Diagnosis not present

## 2017-05-02 MED ORDER — ZOSTER VAC RECOMB ADJUVANTED 50 MCG/0.5ML IM SUSR: 0.5000 mL | Freq: Once | INTRAMUSCULAR | 1 refills | Status: AC

## 2017-05-02 NOTE — Patient Instructions (Addendum)
Thank you for coming in today. Use good rx to research drug prices.  Make sure you see a dermatologist yearly. Continue good health practices.  Recheck yearly with me as needed.    Actinic Keratosis An actinic keratosis is a precancerous growth on the skin. This means that it could develop into skin cancer if it is not treated. About 1% of these growths (actinic keratoses) turn into skin cancer within one year if they are not treated. It is important to have all of these growths evaluated to determine the best treatment approach. What are the causes? This condition is caused by getting too much ultraviolet (UV) radiation from the sun or other UV light sources. What increases the risk? The following factors may make you more likely to develop this condition:  Having light-colored skin and blue eyes.  Having blonde or red hair.  Spending a lot of time in the sun.  Inadequate skin protection when outdoors. This may include: ? Not using sunscreen properly. ? Not covering up skin that is exposed to sunlight.  Aging. The risk of developing an actinic keratosis increases with age.  What are the signs or symptoms? Actinic keratoses look like scaly, rough spots of skin.They can be as small as a pinhead or as big as a quarter. They may itch, hurt, or feel sensitive. In most cases, the growths become red. In some cases, they may be skin-colored, light tan, dark tan, pink, or a combination of any of these colors. There may be a small piece of pink or gray skin (skin tag) growing from the actinic keratosis. In some cases, it may be easier to notice actinic keratoses by feeling them, rather than seeing them. Actinic keratoses appear most often on areas of skin that get a lot of sun exposure, including the scalp, face, ears, lips, upper back, forearms, and the backs of the hands. Sometimes, actinic keratoses disappear, but many reappear a few days to a few weeks later. How is this diagnosed? This  condition is usually diagnosed with a physical exam. A tissue sample may be removed from the actinic keratosis and examined under a microscope (biopsy). How is this treated?  Treatment for this condition may include:  Scraping off the actinic keratosis (curettage).  Freezing the actinic keratosis with liquid nitrogen (cryosurgery). This causes the growth to eventually fall off the skin.  Applying medicated creams or gels to destroy the cells in the growth.  Applying chemicals to the actinic keratosis to make the outer layers of skin peel off (chemical peel).  Photodynamic therapy. In this procedure, medicated cream is applied to the actinic keratosis. This cream increases your skin's sensitivity to light. Then, a strong light is aimed at the actinic keratosis to destroy cells in the growth.  Follow these instructions at home: Skin care  Apply cool, wet cloths (cool compresses) to the affected areas.  Do not scratch your skin.  Check your skin regularly for any growths, especially growths that: ? Start to itch or bleed. ? Change in size, shape, or color. Caring for the treated area  Keep the treated area clean and dry as told by your health care provider.  Do not apply any medicine, cream, or lotion to the treated area unless your health care provider tells you to do that.  Do not pick at blisters or try to break them open. This can cause infection and scarring.  If you have red or irritated skin after treatment, follow instructions from your health care  provider about how to take care of the treated area. Make sure you: ? Wash your hands with soap and water before you change your bandage (dressing). If soap and water are not available, use hand sanitizer. ? Change your dressing as told by your health care provider.  If you have red or irritated skin after treatment, check your treated area every day for signs of infection. Check for: ? Swelling, pain, or more redness. ? Fluid  or blood. ? Warmth. ? Pus or a bad smell. General instructions  Take over-the-counter and prescription medicines only as told by your health care provider.  Return to your normal activities as told by your health care provider. Ask your health care provider what activities are safe for you.  Do not use any tobacco products, such as cigarettes, chewing tobacco, and e-cigarettes. If you need help quitting, ask your health care provider.  Have a skin exam done every year by a health care provider who is a skin conditions specialist (dermatologist).  Keep all follow-up visits as told by your health care provider. This is important. How is this prevented?  Do not get sunburns.  Try to avoid the sun between 10:00 a.m. and 4:00 p.m. This is when the UV light is the strongest.  Use a sunscreen or sunblock with SPF 30 (sun protection factor 30) or greater.  Apply sunscreen before you are exposed to sunlight, and reapply periodically as often as directed by the instructions on the sunscreen container.  Always wear sunglasses that have UV protection, and always wear hats and clothing to protect your skin from sunlight.  When possible, avoid medicines that increase your sensitivity to sunlight. These include: ? Certain antibiotic medicines. ? Certain water pills (diuretics). ? Certain prescription medicines that are used to treat acne (retinoids).  Do not use tanning beds or other indoor tanning devices. Contact a health care provider if:  You notice any changes or new growths on your skin.  You have swelling, pain, or more redness around your treated area.  You have fluid or blood coming from your treated area.  Your treated area feels warm to the touch.  You have pus or a bad smell coming from your treated area.  You have a fever.  You have a blister that becomes large and painful. This information is not intended to replace advice given to you by your health care provider. Make  sure you discuss any questions you have with your health care provider. Document Released: 06/08/2008 Document Revised: 11/11/2015 Document Reviewed: 11/20/2014 Elsevier Interactive Patient Education  [employer].

## 2017-05-02 NOTE — Progress Notes (Signed)
Jonathon Taylor is a 72 y.o. male who presents to [facility] today for well adult visit.  Jonathon Taylor presents to clinic today for well adult visit.  He is doing well with no acute issues.  He recently had inguinal hernia surgery and feels great.  He had a recent discussion about risks versus benefits of statins and he like to avoid statins if possible.  He exercises regularly and tries to eat a balanced careful diet.  He continues to work as an Psychologist, sport and exercise.  He gets regular dermatology screening for history of skin cancer.  He uses fluticasone nasal spray and Advair chronically for asthma.  He notes his asthma is well controlled and does not have to use albuterol frequently at all.  He uses triazolam infrequently as needed for insomnia.   Past Medical History:  Diagnosis Date  . GERD (gastroesophageal reflux disease)   . H/O cardiac catheterization 2004   negative;Dr Tollie Eth  . Personal history of colonic polyps 08/12/2015  . Seasonal allergies   . Skin cancer    Dr [name]   Past Surgical History:  Procedure Laterality Date  . arthroscopic knee surgery Left 1995  . BASAL CELL CARCINOMA EXCISION     2000-2017  . CARDIAC CATHETERIZATION  02/27/2003   per patient " i had spot on symptoms of a heart attack and a specialist, dr Tollie Eth ,  was brought in and after i had  the cath ; they said it was bad reflux . " per patient , after changing diet , no reccurrence of symptoms   . COLONOSCOPY    . INGUINAL HERNIA REPAIR Bilateral 03/12/2017   Procedure: LAPAROSCOPIC BILATERAL INGUINAL HERNIA WITH  MESH;  Surgeon: Clovis Riley, MD;  Location: [facility];  Service: General;  Laterality: Bilateral;  . INSERTION OF MESH N/A 03/12/2017   Procedure: INSERTION OF MESH;  Surgeon: Clovis Riley, MD;  Location: [facility];  Service: General;  Laterality: N/A;    Social History   Tobacco Use  . Smoking status: Never Smoker  . Smokeless tobacco: Never Used  Substance Use Topics  . Alcohol use: Yes    Alcohol/week: 0.0 oz    Comment: 2 glasses of red wine nightly   family history includes Heart disease in his father, maternal grandfather, mother, and sister.  ROS as above:  Medications: Current Outpatient Medications  Medication Sig Dispense Refill  . albuterol (PROVENTIL HFA;VENTOLIN HFA) 108 (90 Base) MCG/ACT inhaler Inhale 2 puffs into the lungs every 6 (six) hours as needed for wheezing. 1 Inhaler 11  . fluticasone (FLONASE) 50 MCG/ACT nasal spray Place 2 sprays into both nostrils daily. (Patient taking differently: Place 1 spray into both nostrils daily as needed for allergies. ) 16 g 11  . Fluticasone-Salmeterol (ADVAIR) 100-50 MCG/DOSE AEPB Inhale 1 puff into the lungs 2 (two) times daily. (Patient taking differently: Inhale 1 puff into the lungs 2 (two) times daily as needed (for shortness of breath or wheezing). ) 1 each 11  . ibuprofen (ADVIL,MOTRIN) 200 MG tablet Take 400 mg by mouth every 6 (six) hours as needed for headache or moderate pain.    Marland Kitchen triazolam (HALCION) 0.25 MG tablet Take 0.5 tablets (0.125 mg total) by mouth at bedtime as needed for sleep. 30 tablet 4  . Zoster Vaccine Adjuvanted [facility]) injection Inject 0.5 mLs into the muscle once for 1 dose. Repeat in 2 months 0.5 mL  1   No current facility-administered medications for this visit.    No Known Allergies  Health Maintenance Health Maintenance  Topic Date Due  . COLONOSCOPY  08/08/2018  . TETANUS/TDAP  05/02/2026  . INFLUENZA VACCINE  Completed  . Hepatitis C Screening  Completed  . PNA vac Low Risk Adult  Completed     Exam:  BP 133/78   Pulse 71   Ht 5' 8.5" (1.74 m)   Wt 161 lb (73 kg)   BMI 24.12 kg/m  Gen: Well NAD HEENT: EOMI,  MMM Lungs: Normal work of breathing. CTABL Heart: RRR no MRG Abd: NABS, Soft. Nondistended, Nontender Exts:  Brisk capillary refill, warm and well perfused.  Depression screen [facility] 2/9 05/02/2016 04/27/2015  Decreased Interest 0 0  Down, Depressed, Hopeless 0 0  PHQ - 2 Score 0 0    Lab Results  Component Value Date   CHOL 221 (H) 03/01/2017   HDL 106 03/01/2017   LDLCALC 102 04/27/2015   TRIG 61 03/01/2017   CHOLHDL 2.1 03/01/2017     Chemistry      Component Value Date/Time   NA 142 03/01/2017 0813   K 4.4 03/01/2017 0813   CL 108 03/01/2017 0813   CO2 29 03/01/2017 0813   BUN 20 03/01/2017 0813   CREATININE 0.84 03/01/2017 0813      Component Value Date/Time   CALCIUM 9.0 03/01/2017 0813   ALKPHOS 39 (L) 07/17/2016 1432   AST 15 03/01/2017 0813   ALT 12 03/01/2017 0813   BILITOT 0.4 03/01/2017 0813     Lab Results  Component Value Date   PSA 3.4 03/01/2017   PSA 4.3 (H) 05/02/2016   PSA 3.68 04/27/2015      Assessment and Plan: 72 y.o. male with well adult doing well.  No acute issues identified today.  Continue healthy diet and exercise.  Will avoid statins for now check lipids yearly if worsening start statins.  Return sooner if needed.  Shingrix vaccine prescribed.   No orders of the defined types were placed in this encounter.  Meds ordered this encounter  Medications  . Zoster Vaccine Adjuvanted [facility]) injection    Sig: Inject 0.5 mLs into the muscle once for 1 dose. Repeat in 2 months    Dispense:  0.5 mL    Refill:  1     Discussed warning signs or symptoms. Please see discharge instructions. Patient expresses understanding.

## 2017-05-26 ENCOUNTER — Other Ambulatory Visit: Payer: Self-pay | Admitting: Family Medicine

## 2017-05-26 ENCOUNTER — Encounter: Payer: Self-pay | Admitting: Family Medicine

## 2017-05-28 MED ORDER — AZITHROMYCIN 250 MG PO TABS: 250.0000 mg | ORAL_TABLET | Freq: Every day | ORAL | 0 refills | Status: DC

## 2017-06-03 ENCOUNTER — Other Ambulatory Visit: Payer: Self-pay

## 2017-06-03 ENCOUNTER — Other Ambulatory Visit: Payer: Self-pay | Admitting: Family Medicine

## 2017-06-03 MED ORDER — FLUTICASONE PROPIONATE 50 MCG/ACT NA SUSP: 2.0000 | Freq: Every day | NASAL | 11 refills | Status: DC

## 2017-06-03 NOTE — Telephone Encounter (Signed)
[facility] called for a refill Flonase. Historical provider.

## 2017-07-06 ENCOUNTER — Encounter: Payer: Self-pay | Admitting: Family Medicine

## 2017-07-07 ENCOUNTER — Encounter: Payer: Self-pay | Admitting: Family Medicine

## 2017-07-08 ENCOUNTER — Encounter (HOSPITAL_COMMUNITY): Payer: Self-pay | Admitting: Emergency Medicine

## 2017-07-08 ENCOUNTER — Other Ambulatory Visit: Payer: Self-pay

## 2017-07-08 ENCOUNTER — Other Ambulatory Visit: Payer: Self-pay | Admitting: Urology

## 2017-07-08 ENCOUNTER — Emergency Department (HOSPITAL_COMMUNITY): Payer: BLUE CROSS/BLUE SHIELD

## 2017-07-08 ENCOUNTER — Inpatient Hospital Stay (HOSPITAL_COMMUNITY): Payer: BLUE CROSS/BLUE SHIELD | Admitting: Anesthesiology

## 2017-07-08 ENCOUNTER — Encounter (HOSPITAL_COMMUNITY)
Admission: EM | Disposition: A | Discharge status: Home / Self Care | Payer: Self-pay | Source: Home / Self Care | Attending: Family Medicine

## 2017-07-08 ENCOUNTER — Inpatient Hospital Stay (HOSPITAL_COMMUNITY)
Admission: EM | Admit: 2017-07-08 | Discharge: 2017-07-09 | DRG: 669 | Disposition: A | Discharge status: Home / Self Care | Payer: BLUE CROSS/BLUE SHIELD | Attending: Internal Medicine | Admitting: Internal Medicine

## 2017-07-08 DIAGNOSIS — K219 Gastro-esophageal reflux disease without esophagitis: Secondary | ICD-10-CM | POA: Diagnosis present

## 2017-07-08 DIAGNOSIS — G4700 Insomnia, unspecified: Secondary | ICD-10-CM | POA: Diagnosis not present

## 2017-07-08 DIAGNOSIS — N3080 Other cystitis without hematuria: Secondary | ICD-10-CM | POA: Diagnosis not present

## 2017-07-08 DIAGNOSIS — Z7951 Long term (current) use of inhaled steroids: Secondary | ICD-10-CM

## 2017-07-08 DIAGNOSIS — R339 Retention of urine, unspecified: Secondary | ICD-10-CM

## 2017-07-08 DIAGNOSIS — Z85828 Personal history of other malignant neoplasm of skin: Secondary | ICD-10-CM | POA: Diagnosis not present

## 2017-07-08 DIAGNOSIS — R1030 Lower abdominal pain, unspecified: Secondary | ICD-10-CM | POA: Diagnosis not present

## 2017-07-08 DIAGNOSIS — R739 Hyperglycemia, unspecified: Secondary | ICD-10-CM | POA: Diagnosis not present

## 2017-07-08 DIAGNOSIS — D62 Acute posthemorrhagic anemia: Secondary | ICD-10-CM | POA: Diagnosis not present

## 2017-07-08 DIAGNOSIS — N3289 Other specified disorders of bladder: Secondary | ICD-10-CM | POA: Diagnosis not present

## 2017-07-08 DIAGNOSIS — N400 Enlarged prostate without lower urinary tract symptoms: Secondary | ICD-10-CM

## 2017-07-08 DIAGNOSIS — R310 Gross hematuria: Secondary | ICD-10-CM | POA: Diagnosis not present

## 2017-07-08 DIAGNOSIS — M4310 Spondylolisthesis, site unspecified: Secondary | ICD-10-CM | POA: Diagnosis not present

## 2017-07-08 DIAGNOSIS — N029 Recurrent and persistent hematuria with unspecified morphologic changes: Secondary | ICD-10-CM | POA: Diagnosis not present

## 2017-07-08 DIAGNOSIS — N3081 Other cystitis with hematuria: Secondary | ICD-10-CM | POA: Diagnosis not present

## 2017-07-08 DIAGNOSIS — N401 Enlarged prostate with lower urinary tract symptoms: Secondary | ICD-10-CM | POA: Diagnosis present

## 2017-07-08 DIAGNOSIS — N320 Bladder-neck obstruction: Secondary | ICD-10-CM | POA: Diagnosis not present

## 2017-07-08 DIAGNOSIS — R319 Hematuria, unspecified: Secondary | ICD-10-CM | POA: Diagnosis not present

## 2017-07-08 DIAGNOSIS — J45909 Unspecified asthma, uncomplicated: Secondary | ICD-10-CM | POA: Diagnosis not present

## 2017-07-08 DIAGNOSIS — E871 Hypo-osmolality and hyponatremia: Secondary | ICD-10-CM | POA: Diagnosis not present

## 2017-07-08 DIAGNOSIS — R338 Other retention of urine: Secondary | ICD-10-CM

## 2017-07-08 DIAGNOSIS — Z79899 Other long term (current) drug therapy: Secondary | ICD-10-CM

## 2017-07-08 DIAGNOSIS — N138 Other obstructive and reflux uropathy: Secondary | ICD-10-CM | POA: Diagnosis not present

## 2017-07-08 HISTORY — PX: TRANSURETHRAL RESECTION OF BLADDER TUMOR: SHX2575

## 2017-07-08 HISTORY — DX: Benign prostatic hyperplasia without lower urinary tract symptoms: N40.0

## 2017-07-08 LAB — I-STAT CHEM 8, ED
BUN: 25 mg/dL — AB (ref 6–20)
CALCIUM ION: 1.11 mmol/L — AB (ref 1.15–1.40)
Chloride: 94 mmol/L — ABNORMAL LOW (ref 101–111)
Creatinine, Ser: 1 mg/dL (ref 0.61–1.24)
Glucose, Bld: 159 mg/dL — ABNORMAL HIGH (ref 65–99)
HEMATOCRIT: 24 % — AB (ref 39.0–52.0)
HEMOGLOBIN: 8.2 g/dL — AB (ref 13.0–17.0)
Potassium: 4.6 mmol/L (ref 3.5–5.1)
SODIUM: 130 mmol/L — AB (ref 135–145)
TCO2: 27 mmol/L (ref 22–32)

## 2017-07-08 LAB — COMPREHENSIVE METABOLIC PANEL
ALT: 14 U/L — ABNORMAL LOW (ref 17–63)
ANION GAP: 8 (ref 5–15)
AST: 22 U/L (ref 15–41)
Albumin: 3.5 g/dL (ref 3.5–5.0)
Alkaline Phosphatase: 31 U/L — ABNORMAL LOW (ref 38–126)
BILIRUBIN TOTAL: 0.9 mg/dL (ref 0.3–1.2)
BUN: 24 mg/dL — ABNORMAL HIGH (ref 6–20)
CO2: 24 mmol/L (ref 22–32)
Calcium: 8.3 mg/dL — ABNORMAL LOW (ref 8.9–10.3)
Chloride: 97 mmol/L — ABNORMAL LOW (ref 101–111)
Creatinine, Ser: 1.01 mg/dL (ref 0.61–1.24)
GFR calc non Af Amer: >60 mL/min (ref >60)
Glucose, Bld: 165 mg/dL — ABNORMAL HIGH (ref 65–99)
POTASSIUM: 4.7 mmol/L (ref 3.5–5.1)
Sodium: 129 mmol/L — ABNORMAL LOW (ref 135–145)
TOTAL PROTEIN: 5.7 g/dL — AB (ref 6.5–8.1)

## 2017-07-08 LAB — CBC WITH DIFFERENTIAL/PLATELET
BASOS PCT: 0 %
Basophils Absolute: 0 10*3/uL (ref 0.0–0.1)
EOS ABS: 0 10*3/uL (ref 0.0–0.7)
EOS PCT: 0 %
HCT: 24.2 % — ABNORMAL LOW (ref 39.0–52.0)
HEMOGLOBIN: 8.1 g/dL — AB (ref 13.0–17.0)
LYMPHS ABS: 0.6 10*3/uL — AB (ref 0.7–4.0)
Lymphocytes Relative: 9 %
MCH: 27.7 pg (ref 26.0–34.0)
MCHC: 33.5 g/dL (ref 30.0–36.0)
MCV: 82.9 fL (ref 78.0–100.0)
MONO ABS: 0.4 10*3/uL (ref 0.1–1.0)
MONOS PCT: 6 %
Neutro Abs: 6.1 10*3/uL (ref 1.7–7.7)
Neutrophils Relative %: 85 %
Platelets: 203 10*3/uL (ref 150–400)
RBC: 2.92 MIL/uL — ABNORMAL LOW (ref 4.22–5.81)
RDW: 15.4 % (ref 11.5–15.5)
WBC: 7.1 10*3/uL (ref 4.0–10.5)

## 2017-07-08 LAB — GLUCOSE, CAPILLARY: Glucose-Capillary: 93 mg/dL (ref 65–99)

## 2017-07-08 LAB — HEMOGLOBIN AND HEMATOCRIT, BLOOD
HCT: 28.2 % — ABNORMAL LOW (ref 39.0–52.0)
HEMATOCRIT: 26.7 % — AB (ref 39.0–52.0)
HEMOGLOBIN: 9 g/dL — AB (ref 13.0–17.0)
Hemoglobin: 9.4 g/dL — ABNORMAL LOW (ref 13.0–17.0)

## 2017-07-08 LAB — SURGICAL PCR SCREEN
MRSA, PCR: NEGATIVE
Staphylococcus aureus: NEGATIVE

## 2017-07-08 LAB — URINALYSIS, ROUTINE W REFLEX MICROSCOPIC: SQUAMOUS EPITHELIAL / LPF: NONE SEEN

## 2017-07-08 LAB — ABO/RH: ABO/RH(D): A NEG

## 2017-07-08 LAB — HEMOGLOBIN A1C
HEMOGLOBIN A1C: 5.2 % (ref 4.8–5.6)
Mean Plasma Glucose: 102.54 mg/dL

## 2017-07-08 LAB — PREPARE RBC (CROSSMATCH)

## 2017-07-08 SURGERY — TURBT (TRANSURETHRAL RESECTION OF BLADDER TUMOR)
Anesthesia: General | Site: Bladder

## 2017-07-08 MED ORDER — SUGAMMADEX SODIUM 200 MG/2ML IV SOLN
INTRAVENOUS | Status: AC
  Filled 2017-07-08: qty 2

## 2017-07-08 MED ORDER — FENTANYL CITRATE (PF) 100 MCG/2ML IJ SOLN
INTRAMUSCULAR | Status: DC | PRN
  Administered 2017-07-08 (×4): 50 ug via INTRAVENOUS

## 2017-07-08 MED ORDER — ONDANSETRON HCL 4 MG/2ML IJ SOLN
INTRAMUSCULAR | Status: AC
  Filled 2017-07-08: qty 4

## 2017-07-08 MED ORDER — LACTATED RINGERS IV SOLN
INTRAVENOUS | Status: DC
  Administered 2017-07-08 – 2017-07-09 (×2): via INTRAVENOUS

## 2017-07-08 MED ORDER — ACETAMINOPHEN 650 MG RE SUPP: 650.0000 mg | Freq: Four times a day (QID) | RECTAL | Status: DC | PRN

## 2017-07-08 MED ORDER — STERILE WATER FOR IRRIGATION IR SOLN
Status: DC | PRN
  Administered 2017-07-08: 6000 mL

## 2017-07-08 MED ORDER — CEFAZOLIN SODIUM-DEXTROSE 2-4 GM/100ML-% IV SOLN
2.0000 g | INTRAVENOUS | Status: AC
  Administered 2017-07-08: 2 g via INTRAVENOUS
  Filled 2017-07-08: qty 100

## 2017-07-08 MED ORDER — HYDROMORPHONE HCL 1 MG/ML IJ SOLN
0.2500 mg | INTRAMUSCULAR | Status: DC | PRN
  Administered 2017-07-08 (×2): 0.5 mg via INTRAVENOUS

## 2017-07-08 MED ORDER — SODIUM CHLORIDE 0.9 % IV BOLUS
1000.0000 mL | Freq: Once | INTRAVENOUS | Status: AC
  Administered 2017-07-08: 1000 mL via INTRAVENOUS

## 2017-07-08 MED ORDER — EPHEDRINE 5 MG/ML INJ
INTRAVENOUS | Status: AC
  Filled 2017-07-08: qty 10

## 2017-07-08 MED ORDER — SODIUM CHLORIDE 0.9 % IV SOLN
INTRAVENOUS | Status: DC | PRN
  Administered 2017-07-08: 12:00:00 via INTRAVENOUS

## 2017-07-08 MED ORDER — SODIUM CHLORIDE 0.9 % IR SOLN
3000.0000 mL | Status: DC
  Administered 2017-07-08: 3000 mL

## 2017-07-08 MED ORDER — STERILE WATER FOR IRRIGATION IR SOLN
Status: DC | PRN
  Administered 2017-07-08: 1000 mL

## 2017-07-08 MED ORDER — OXYCODONE HCL 5 MG/5ML PO SOLN: 5.0000 mg | Freq: Once | ORAL | Status: DC | PRN

## 2017-07-08 MED ORDER — EPHEDRINE SULFATE 50 MG/ML IJ SOLN
INTRAMUSCULAR | Status: DC | PRN
  Administered 2017-07-08: 7.5 mg via INTRAVENOUS

## 2017-07-08 MED ORDER — SODIUM CHLORIDE 0.9 % IV SOLN
1.0000 g | Freq: Once | INTRAVENOUS | Status: AC
  Administered 2017-07-08: 1 g via INTRAVENOUS
  Filled 2017-07-08: qty 10

## 2017-07-08 MED ORDER — LIDOCAINE HCL 2 % EX GEL
1.0000 "application " | Freq: Once | CUTANEOUS | Status: DC | PRN
  Filled 2017-07-08: qty 5

## 2017-07-08 MED ORDER — MORPHINE SULFATE (PF) 4 MG/ML IV SOLN: 1.0000 mg | INTRAVENOUS | Status: DC | PRN

## 2017-07-08 MED ORDER — FENTANYL CITRATE (PF) 100 MCG/2ML IJ SOLN
INTRAMUSCULAR | Status: AC
  Filled 2017-07-08: qty 2

## 2017-07-08 MED ORDER — INSULIN ASPART 100 UNIT/ML ~~LOC~~ SOLN: 0.0000 [IU] | Freq: Every day | SUBCUTANEOUS | Status: DC

## 2017-07-08 MED ORDER — LACTATED RINGERS IV SOLN
INTRAVENOUS | Status: DC | PRN
  Administered 2017-07-08 (×2): via INTRAVENOUS

## 2017-07-08 MED ORDER — TAMSULOSIN HCL 0.4 MG PO CAPS
0.4000 mg | ORAL_CAPSULE | Freq: Every day | ORAL | Status: DC
  Administered 2017-07-08 – 2017-07-09 (×2): 0.4 mg via ORAL
  Filled 2017-07-08 (×2): qty 1

## 2017-07-08 MED ORDER — OXYCODONE HCL 5 MG PO TABS: 5.0000 mg | ORAL_TABLET | Freq: Once | ORAL | Status: DC | PRN

## 2017-07-08 MED ORDER — SODIUM CHLORIDE 0.9 % IV SOLN: Freq: Once | INTRAVENOUS | Status: DC

## 2017-07-08 MED ORDER — PROPOFOL 10 MG/ML IV BOLUS
INTRAVENOUS | Status: DC | PRN
  Administered 2017-07-08: 110 mg via INTRAVENOUS

## 2017-07-08 MED ORDER — PROPOFOL 10 MG/ML IV BOLUS
INTRAVENOUS | Status: AC
  Filled 2017-07-08: qty 20

## 2017-07-08 MED ORDER — TRIAZOLAM 0.125 MG PO TABS: 0.1250 mg | ORAL_TABLET | Freq: Every evening | ORAL | Status: DC | PRN

## 2017-07-08 MED ORDER — MUPIROCIN 2 % EX OINT
1.0000 "application " | TOPICAL_OINTMENT | Freq: Two times a day (BID) | CUTANEOUS | Status: DC
  Administered 2017-07-08: 1 via NASAL
  Filled 2017-07-08: qty 22

## 2017-07-08 MED ORDER — HYDROMORPHONE HCL 1 MG/ML IJ SOLN
INTRAMUSCULAR | Status: AC
  Administered 2017-07-08: 0.5 mg via INTRAVENOUS
  Filled 2017-07-08: qty 1

## 2017-07-08 MED ORDER — ONDANSETRON HCL 4 MG/2ML IJ SOLN
INTRAMUSCULAR | Status: DC | PRN
  Administered 2017-07-08: 4 mg via INTRAVENOUS

## 2017-07-08 MED ORDER — ACETAMINOPHEN 325 MG PO TABS: 650.0000 mg | ORAL_TABLET | Freq: Four times a day (QID) | ORAL | Status: DC | PRN

## 2017-07-08 MED ORDER — SODIUM CHLORIDE 0.9 % IR SOLN
Status: DC | PRN
  Administered 2017-07-08: 18000 mL

## 2017-07-08 MED ORDER — ROCURONIUM BROMIDE 10 MG/ML (PF) SYRINGE
PREFILLED_SYRINGE | INTRAVENOUS | Status: AC
  Filled 2017-07-08: qty 5

## 2017-07-08 MED ORDER — ROCURONIUM BROMIDE 100 MG/10ML IV SOLN
INTRAVENOUS | Status: DC | PRN
  Administered 2017-07-08: 10 mg via INTRAVENOUS
  Administered 2017-07-08: 50 mg via INTRAVENOUS
  Administered 2017-07-08: 10 mg via INTRAVENOUS

## 2017-07-08 MED ORDER — MORPHINE BOLUS VIA INFUSION: 1.0000 mg | INTRAVENOUS | Status: DC | PRN

## 2017-07-08 MED ORDER — SODIUM CHLORIDE 0.9 % IV SOLN
1.0000 g | INTRAVENOUS | Status: DC
  Filled 2017-07-08: qty 10

## 2017-07-08 MED ORDER — DOCUSATE SODIUM 100 MG PO CAPS
100.0000 mg | ORAL_CAPSULE | Freq: Two times a day (BID) | ORAL | Status: DC
  Administered 2017-07-08 – 2017-07-09 (×2): 100 mg via ORAL
  Filled 2017-07-08 (×2): qty 1

## 2017-07-08 MED ORDER — LIDOCAINE HCL (CARDIAC) 20 MG/ML IV SOLN
INTRAVENOUS | Status: DC | PRN
  Administered 2017-07-08: 100 mg via INTRAVENOUS

## 2017-07-08 MED ORDER — SUGAMMADEX SODIUM 200 MG/2ML IV SOLN
INTRAVENOUS | Status: DC | PRN
  Administered 2017-07-08: 200 mg via INTRAVENOUS

## 2017-07-08 MED ORDER — OXYCODONE HCL 5 MG PO TABS
5.0000 mg | ORAL_TABLET | ORAL | Status: DC | PRN
Start: 2017-07-08 — End: 2017-07-09
  Administered 2017-07-08 – 2017-07-09 (×2): 5 mg via ORAL
  Filled 2017-07-08 (×2): qty 1

## 2017-07-08 MED ORDER — INSULIN ASPART 100 UNIT/ML ~~LOC~~ SOLN: 0.0000 [IU] | Freq: Three times a day (TID) | SUBCUTANEOUS | Status: DC

## 2017-07-08 MED ORDER — DEXAMETHASONE SODIUM PHOSPHATE 10 MG/ML IJ SOLN
INTRAMUSCULAR | Status: DC | PRN
  Administered 2017-07-08: 10 mg via INTRAVENOUS

## 2017-07-08 MED ORDER — FINASTERIDE 5 MG PO TABS
5.0000 mg | ORAL_TABLET | Freq: Every day | ORAL | Status: DC
  Administered 2017-07-08 – 2017-07-09 (×2): 5 mg via ORAL
  Filled 2017-07-08 (×2): qty 1

## 2017-07-08 MED ORDER — ONDANSETRON HCL 4 MG/2ML IJ SOLN: 4.0000 mg | Freq: Once | INTRAMUSCULAR | Status: DC | PRN

## 2017-07-08 MED ORDER — DEXAMETHASONE SODIUM PHOSPHATE 10 MG/ML IJ SOLN
INTRAMUSCULAR | Status: AC
  Filled 2017-07-08: qty 2

## 2017-07-08 MED ORDER — SODIUM CHLORIDE 0.9 % IV BOLUS: 1000.0000 mL | Freq: Once | INTRAVENOUS | Status: DC

## 2017-07-08 SURGICAL SUPPLY — 24 items
BAG URINE DRAINAGE (UROLOGICAL SUPPLIES) IMPLANT
BAG URO CATCHER STRL LF (MISCELLANEOUS) ×2 IMPLANT
CATH FOLEY 3WAY 30CC 22FR (CATHETERS) IMPLANT
CATH HEMA 3WAY 30CC 24FR COUDE (CATHETERS) ×2 IMPLANT
CATH URET 5FR 28IN OPEN ENDED (CATHETERS) IMPLANT
COVER FOOTSWITCH UNIV (MISCELLANEOUS) ×2 IMPLANT
COVER SURGICAL LIGHT HANDLE (MISCELLANEOUS) ×2 IMPLANT
GLOVE BIOGEL PI IND STRL 7.5 (GLOVE) ×1 IMPLANT
GLOVE BIOGEL PI INDICATOR 7.5 (GLOVE) ×1
GLOVE SURG SS PI 7.5 STRL IVOR (GLOVE) ×2 IMPLANT
GLOVE SURG SS PI 8.0 STRL IVOR (GLOVE) IMPLANT
GOWN STRL REUS W/TWL XL LVL3 (GOWN DISPOSABLE) ×4 IMPLANT
HOLDER FOLEY CATH W/STRAP (MISCELLANEOUS) IMPLANT
KIT BASIN OR (CUSTOM PROCEDURE TRAY) ×2 IMPLANT
LOOP CUT BIPOLAR 24F LRG (ELECTROSURGICAL) ×2 IMPLANT
MANIFOLD NEPTUNE II (INSTRUMENTS) ×2 IMPLANT
PACK CYSTO (CUSTOM PROCEDURE TRAY) ×2 IMPLANT
PLUG CATH AND CAP STER (CATHETERS) ×2 IMPLANT
SET ASPIRATION TUBING (TUBING) ×2 IMPLANT
SUT ETHILON 3 0 PS 1 (SUTURE) IMPLANT
SYR 30ML LL (SYRINGE) IMPLANT
SYRINGE IRR TOOMEY STRL 70CC (SYRINGE) ×2 IMPLANT
TUBING CONNECTING 10 (TUBING) ×2 IMPLANT
TUBING UROLOGY SET (TUBING) ×2 IMPLANT

## 2017-07-08 NOTE — ED Provider Notes (Addendum)
[facility] DEPT Provider Note   CSN: [id number] Arrival date & time: 07/08/17  0551     History   Chief Complaint Chief Complaint  Patient presents with  . Urinary Retention    HPI VERSHAWN WESTRUP is a 72 y.o. male.  The history is provided by the patient.  Illness  This is a new problem. The current episode started more than 2 days ago. The problem occurs constantly. The problem has not changed since onset.Associated symptoms include abdominal pain. Pertinent negatives include no chest pain, no headaches and no shortness of breath. Nothing aggravates the symptoms. Nothing relieves the symptoms. He has tried nothing for the symptoms. The treatment provided no relief.  Has not had more than a dribble of urine since 23-Jun-2022. Has passed blood and clots.  No f/c/r.  No rectal bleeding.  No CP no sob.    Past Medical History:  Diagnosis Date  . GERD (gastroesophageal reflux disease)   . H/O cardiac catheterization June 23, 2002   negative;Dr Tollie Eth  . Personal history of colonic polyps 08/12/2015  . Prostate enlargement   . Seasonal allergies   . Skin cancer    Dr [name]    Patient Active Problem List   Diagnosis Date Noted  . Elevated PSA 05/04/2016  . Spondylisthesis 08/29/2015  . Personal history of colonic polyps 08/12/2015  . Insomnia 01/18/2014  . RAD (reactive airway disease) 11/26/2012  . Family history of cardiovascular disease 11/26/2012    Past Surgical History:  Procedure Laterality Date  . arthroscopic knee surgery Left 1995  . BASAL CELL CARCINOMA EXCISION     2000-2017  . CARDIAC CATHETERIZATION  02/27/2003   per patient " i had spot on symptoms of a heart attack and a specialist, dr Tollie Eth ,  was brought in and after i had  the cath ; they said it was bad reflux . " per patient , after changing diet , no reccurrence of symptoms   . COLONOSCOPY    . INGUINAL HERNIA REPAIR Bilateral 03/12/2017   Procedure:  LAPAROSCOPIC BILATERAL INGUINAL HERNIA WITH  MESH;  Surgeon: Clovis Riley, MD;  Location: [facility];  Service: General;  Laterality: Bilateral;  . INSERTION OF MESH N/A 03/12/2017   Procedure: INSERTION OF MESH;  Surgeon: Clovis Riley, MD;  Location: [facility];  Service: General;  Laterality: N/A;        Home Medications    Prior to Admission medications   Medication Sig Start Date End Date Taking? Authorizing Provider  ADVAIR DISKUS 100-50 MCG/DOSE AEPB INHALE 1 PUFF INTO THE LUNGS 2 TIMES DAILY 06/03/17   Gregor Hams, MD  albuterol (PROVENTIL HFA;VENTOLIN HFA) 108 (90 Base) MCG/ACT inhaler Inhale 2 puffs into the lungs every 6 (six) hours as needed for wheezing. 04/27/15   Leandrew Koyanagi, MD  azithromycin (ZITHROMAX) 250 MG tablet Take 1 tablet (250 mg total) by mouth daily. Take first 2 tablets together, then 1 every day until finished. 05/28/17   Gregor Hams, MD  fluticasone (FLONASE) 50 MCG/ACT nasal spray Place 2 sprays into both nostrils daily. 06/03/17   Gregor Hams, MD  ibuprofen (ADVIL,MOTRIN) 200 MG tablet Take 400 mg by mouth every 6 (six) hours as needed for headache or moderate pain.    [provider]  triazolam (HALCION) 0.25 MG tablet Take 0.5 tablets (0.125 mg total) by mouth at bedtime as needed for sleep. 01/08/17   Gregor Hams, MD  Family History Family History  Problem Relation Age of Onset  . Heart disease Mother   . Heart disease Maternal Grandfather   . Heart disease Father   . Heart disease Sister   . Colon cancer Neg Hx     Social History Social History   Tobacco Use  . Smoking status: Never Smoker  . Smokeless tobacco: Never Used  Substance Use Topics  . Alcohol use: Yes    Alcohol/week: 0.0 oz    Comment: 2 glasses of red wine nightly  . Drug use: No     Allergies   Patient has no known allergies.   Review of Systems Review of Systems  Constitutional: Negative for fever.  HENT: Negative for dental problem.   Eyes:  Negative for photophobia.  Respiratory: Negative for shortness of breath.   Cardiovascular: Negative for chest pain.  Gastrointestinal: Positive for abdominal distention and abdominal pain.  Genitourinary: Positive for difficulty urinating and hematuria.  Neurological: Negative for headaches.  Hematological: Negative for adenopathy.  All other systems reviewed and are negative.    Physical Exam Updated Vital Signs BP 110/74 (BP Location: Right Arm)   Pulse (!) 102   Temp 98.4 F (36.9 C) (Oral)   Resp 18   Ht 5' 8.5" (1.74 m)   Wt 74.8 kg (165 lb)   SpO2 100%   BMI 24.72 kg/m   Physical Exam  Constitutional: He is oriented to person, place, and time. He appears well-developed and well-nourished. No distress.  HENT:  Head: Normocephalic and atraumatic.  Mouth/Throat: No oropharyngeal exudate.  Eyes: Pupils are equal, round, and reactive to light. Conjunctivae are normal.  Neck: Normal range of motion. Neck supple.  Cardiovascular: Normal rate, regular rhythm, normal heart sounds and intact distal pulses.  Pulmonary/Chest: Effort normal and breath sounds normal. No stridor. He has no wheezes. He has no rales.  Abdominal: Soft. Bowel sounds are normal. He exhibits distension. He exhibits no mass. There is no tenderness. There is no rebound and no guarding.  Musculoskeletal: Normal range of motion.  Neurological: He is alert and oriented to person, place, and time. He displays normal reflexes.  Skin: Skin is warm and dry. Capillary refill takes less than 2 seconds.  Psychiatric: He has a normal mood and affect.     ED Treatments / Results  Labs (all labs ordered are listed, but only abnormal results are displayed)  Results for orders placed or performed during the hospital encounter of 07/08/17  CBC with Differential/Platelet  Result Value Ref Range   WBC 7.1 4.0 - 10.5 K/uL   RBC 2.92 (L) 4.22 - 5.81 MIL/uL   Hemoglobin 8.1 (L) 13.0 - 17.0 g/dL   HCT 24.2 (L) 39.0 -  52.0 %   MCV 82.9 78.0 - 100.0 fL   MCH 27.7 26.0 - 34.0 pg   MCHC 33.5 30.0 - 36.0 g/dL   RDW 15.4 11.5 - 15.5 %   Platelets 203 150 - 400 K/uL   Neutrophils Relative % 85 %   Neutro Abs 6.1 1.7 - 7.7 K/uL   Lymphocytes Relative 9 %   Lymphs Abs 0.6 (L) 0.7 - 4.0 K/uL   Monocytes Relative 6 %   Monocytes Absolute 0.4 0.1 - 1.0 K/uL   Eosinophils Relative 0 %   Eosinophils Absolute 0.0 0.0 - 0.7 K/uL   Basophils Relative 0 %   Basophils Absolute 0.0 0.0 - 0.1 K/uL  Urinalysis, Routine w reflex microscopic  Result Value Ref Range  Color, Urine RED (A) YELLOW   APPearance TURBID (A) CLEAR   Specific Gravity, Urine  1.005 - 1.030    TEST NOT REPORTED DUE TO COLOR INTERFERENCE OF URINE PIGMENT   pH  5.0 - 8.0    TEST NOT REPORTED DUE TO COLOR INTERFERENCE OF URINE PIGMENT   Glucose, UA (A) NEGATIVE mg/dL    TEST NOT REPORTED DUE TO COLOR INTERFERENCE OF URINE PIGMENT   Hgb urine dipstick (A) NEGATIVE    TEST NOT REPORTED DUE TO COLOR INTERFERENCE OF URINE PIGMENT   Bilirubin Urine (A) NEGATIVE    TEST NOT REPORTED DUE TO COLOR INTERFERENCE OF URINE PIGMENT   Ketones, ur (A) NEGATIVE mg/dL    TEST NOT REPORTED DUE TO COLOR INTERFERENCE OF URINE PIGMENT   Protein, ur (A) NEGATIVE mg/dL    TEST NOT REPORTED DUE TO COLOR INTERFERENCE OF URINE PIGMENT   Nitrite (A) NEGATIVE    TEST NOT REPORTED DUE TO COLOR INTERFERENCE OF URINE PIGMENT   Leukocytes, UA (A) NEGATIVE    TEST NOT REPORTED DUE TO COLOR INTERFERENCE OF URINE PIGMENT   RBC / HPF TOO NUMEROUS TO COUNT 0 - 5 RBC/hpf   WBC, UA TOO NUMEROUS TO COUNT 0 - 5 WBC/hpf   Bacteria, UA RARE (A) NONE SEEN   Squamous Epithelial / LPF NONE SEEN NONE SEEN  I-stat chem 8, ed  Result Value Ref Range   Sodium 130 (L) 135 - 145 mmol/L   Potassium 4.6 3.5 - 5.1 mmol/L   Chloride 94 (L) 101 - 111 mmol/L   BUN 25 (H) 6 - 20 mg/dL   Creatinine, Ser 1.00 0.61 - 1.24 mg/dL   Glucose, Bld 159 (H) 65 - 99 mg/dL   Calcium, Ion 1.11 (L) 1.15  - 1.40 mmol/L   TCO2 27 22 - 32 mmol/L   Hemoglobin 8.2 (L) 13.0 - 17.0 g/dL   HCT 24.0 (L) 39.0 - 52.0 %   Ct Renal Stone Study  Result Date: 07/08/2017 CLINICAL DATA:  Difficulty urinating over the last 3 days. Hematuria. EXAM: CT ABDOMEN AND PELVIS WITHOUT CONTRAST TECHNIQUE: Multidetector CT imaging of the abdomen and pelvis was performed following the standard protocol without IV contrast. COMPARISON:  09/08/2010 FINDINGS: Lower chest: Normal Hepatobiliary: Chronic benign cyst of the left lobe of the liver, unchanged. No calcified gallstones. Pancreas: Normal Spleen: Normal Adrenals/Urinary Tract: Adrenal glands are normal. Kidneys are normal. Foley catheter is present within the bladder. Bladder is full of hyperdense material consistent with blood. The prostate is massively enlarged. Mild asymmetric wall thickening at the dome of the bladder. Stomach/Bowel: Normal Vascular/Lymphatic: Aortic atherosclerosis. Reproductive: Otherwise negative. Other: No free fluid or air. Musculoskeletal: Chronic degenerative spondylosis. 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 IMPRESSION: Bladder is distended with hyperdense material presumably representing blood clot. Foley catheter in the bladder. Massively enlarged prostate. Some asymmetric thickening of the bladder wall at the dome. Bladder wall mass not excluded by this exam. Electronically Signed   By: Nelson Chimes M.D.   On: 07/08/2017 07:56    Radiology No results found.  Procedures Procedures (including critical care time)  Medications Ordered in ED  Medications  lidocaine (XYLOCAINE) 2 % jelly 1 application (has no administration in time range)  sodium chloride 0.9 % bolus 1,000 mL (has no administration in time range)  cefTRIAXone (ROCEPHIN) 1 g in sodium chloride 0.9 % 100 mL IVPB (has no administration in time range)  sodium chloride 0.9 % bolus 1,000 mL (has no  administration in time range)    MDM Reviewed: previous chart,  nursing note and vitals Reviewed previous: labs Interpretation: labs (6 gram drop of hemoglobin normal creatinine) Total time providing critical care: 30-74 minutes. This excludes time spent performing separately reportable procedures and services.  CRITICAL CARE Performed by: Carlisle Beers Total critical care time: 60 minutes Critical care time was exclusive of separately billable procedures and treating other patients. Critical care was necessary to treat or prevent imminent or life-threatening deterioration. Critical care was time spent personally by me on the following activities: development of treatment plan with patient and/or surrogate as well as nursing, discussions with consultants, evaluation of patient's response to treatment, examination of patient, obtaining history from patient or surrogate, ordering and performing treatments and interventions, ordering and review of laboratory studies, ordering and review of radiographic studies, pulse oximetry and re-evaluation of patient's condition.   Final Clinical Impressions(s) / ED Diagnoses  Hematuria will admit to medicine:  Consulted to urology.  Pending call back.     Alysiah Suppa, MD 07/08/17 Waldron, Darian Ace, MD 07/08/17 [id number]

## 2017-07-08 NOTE — Progress Notes (Signed)
Assumed care of this patient by previous nurse. Agree with prior assessment. Will continue to monitor this patient.

## 2017-07-08 NOTE — H&P (Signed)
Subjective: CC: Hematuria.  Hx: [name] is a 72 yo WM with a history of BPH with BOO and recurrent hematuria.  I was asked to see him in consultation by Dr. Randal Buba for clot retention.   He had the onset 3 days ago of hematuria with clots and difficulty voiding.   He had doxycycline and tamsulosin called in over the weekend but was reluctant to come to the ER.  The symptoms progressed and he finally came.  He was in pain and had dizziness with hypotension on admission.   He was found on CT to have a bladder filled with clot and a very large prostate.   His Hgb was 8.2 and a transfusion has been ordered.  His BP was 85/60 but has come up with fluids.  He continues to have suprapubic pain and has a 21fr foley in place with some drainage of bloody urine.  He has had no fever.  ROS:  Review of Systems  Respiratory: Negative for shortness of breath.   Gastrointestinal: Positive for abdominal pain.  Genitourinary: Positive for dysuria and hematuria.  Neurological: Positive for dizziness.  All other systems reviewed and are negative.   No Known Allergies  Past Medical History:  Diagnosis Date  . GERD (gastroesophageal reflux disease)   . H/O cardiac catheterization 2004   negative;Dr Tollie Eth  . Personal history of colonic polyps 08/12/2015  . Prostate enlargement   . Seasonal allergies   . Skin cancer    Dr [name]    Past Surgical History:  Procedure Laterality Date  . arthroscopic knee surgery Left 1995  . BASAL CELL CARCINOMA EXCISION     2000-2017  . CARDIAC CATHETERIZATION  02/27/2003   per patient " i had spot on symptoms of a heart attack and a specialist, dr Tollie Eth ,  was brought in and after i had  the cath ; they said it was bad reflux . " per patient , after changing diet , no reccurrence of symptoms   . COLONOSCOPY    . INGUINAL HERNIA REPAIR Bilateral 03/12/2017   Procedure: LAPAROSCOPIC BILATERAL INGUINAL HERNIA WITH  MESH;  Surgeon: Clovis Riley, MD;  Location: [facility];  Service: General;  Laterality: Bilateral;  . INSERTION OF MESH N/A 03/12/2017   Procedure: INSERTION OF MESH;  Surgeon: Clovis Riley, MD;  Location: [facility];  Service: General;  Laterality: N/A;    Social History   Socioeconomic History  . Marital status: Married    Spouse name: Not on file  . Number of children: Not on file  . Years of education: Not on file  . Highest education level: Not on file  Occupational History  . Occupation: Professor  Social Needs  . Financial resource strain: Not on file  . Food insecurity:    Worry: Not on file    Inability: Not on file  . Transportation needs:    Medical: Not on file    Non-medical: Not on file  Tobacco Use  . Smoking status: Never Smoker  . Smokeless tobacco: Never Used  Substance and Sexual Activity  . Alcohol use: Yes    Alcohol/week: 0.0 oz    Comment: 2 glasses of red wine nightly  . Drug use: No  . Sexual activity: Yes  Lifestyle  . Physical activity:    Days per week: Not on file    Minutes per session: Not on file  . Stress: Not on file  Relationships  . Social  connections:    Talks on phone: Not on file    Gets together: Not on file    Attends religious service: Not on file    Active member of club or organization: Not on file    Attends meetings of clubs or organizations: Not on file    Relationship status: Not on file  . Intimate partner violence:    Fear of current or ex partner: Not on file    Emotionally abused: Not on file    Physically abused: Not on file    Forced sexual activity: Not on file  Other Topics Concern  . Not on file  Social History Narrative   Married. Education: Other. Exercise: Bike/light weights everyday for 45-60 minutes.    Family History  Problem Relation Age of Onset  . Heart disease Mother   . Heart disease Maternal Grandfather   . Heart disease Father   . Heart disease Sister   . Colon cancer Neg Hx      Anti-infectives: Anti-infectives (From admission, onward)   Start     Dose/Rate Route Frequency Ordered Stop   07/08/17 0730  cefTRIAXone (ROCEPHIN) 1 g in sodium chloride 0.9 % 100 mL IVPB     1 g 200 mL/hr over 30 Minutes Intravenous  Once 07/08/17 0727 07/08/17 0951      Current Facility-Administered Medications  Medication Dose Route Frequency Provider Last Rate Last Dose  . 0.9 %  sodium chloride infusion   Intravenous Once Elodia Florence., MD      . insulin aspart (novoLOG) injection 0-5 Units  0-5 Units Subcutaneous QHS Elodia Florence., MD      . insulin aspart (novoLOG) injection 0-9 Units  0-9 Units Subcutaneous TID WC Elodia Florence., MD      . lactated ringers infusion   Intravenous Continuous Elodia Florence., MD      . lidocaine (XYLOCAINE) 2 % jelly 1 application  1 application Urethral Once PRN Palumbo, April, MD      . sodium chloride 0.9 % bolus 1,000 mL  1,000 mL Intravenous Once Palumbo, April, MD       Facility-Administered Medications Ordered in Other Encounters  Medication Dose Route Frequency Provider Last Rate Last Dose  . lactated ringers infusion    Continuous PRN Key, Kristopher, CRNA         Objective: Vital signs in last 24 hours: Temp:  [98.4 F (36.9 C)] 98.4 F (36.9 C) (04/15 0608) Pulse Rate:  [86-102] 86 (04/15 0731) Resp:  [15-18] 15 (04/15 0731) BP: (85-120)/(66-74) 120/74 (04/15 0731) SpO2:  [100 %] 100 % (04/15 0731) Weight:  [74.8 kg (165 lb)] 74.8 kg (165 lb) (04/15 0609)  Intake/Output from previous day: No intake/output data recorded. Intake/Output this shift: Total I/O In: 1100 [IV Piggyback:1100] Out: -    Physical Exam  Constitutional: He is oriented to person, place, and time. He appears well-developed and well-nourished.  HENT:  Head: Normocephalic and atraumatic.  Neck: Normal range of motion. Neck supple. No thyromegaly present.  Cardiovascular: Normal rate, regular rhythm and normal  heart sounds.  Pulmonary/Chest: Effort normal and breath sounds normal. No respiratory distress.  Abdominal: Soft. He exhibits mass (suprapubic). There is tenderness.  Genitourinary:  Genitourinary Comments: Normal phallus with foley at the meatus.   Normal scrotum and contents.   Musculoskeletal: Normal range of motion. He exhibits no edema or tenderness.  Lymphadenopathy:    He has no cervical adenopathy.  Right: No supraclavicular adenopathy present.       Left: No supraclavicular adenopathy present.  Neurological: He is alert and oriented to person, place, and time.  Vitals reviewed.   Lab Results:  Recent Labs    07/08/17 0703 07/08/17 0709  WBC 7.1  --   HGB 8.1* 8.2*  HCT 24.2* 24.0*  PLT 203  --    BMET Recent Labs    07/08/17 0703 07/08/17 0709  NA 129* 130*  K 4.7 4.6  CL 97* 94*  CO2 24  --   GLUCOSE 165* 159*  BUN 24* 25*  CREATININE 1.01 1.00  CALCIUM 8.3*  --    PT/INR No results for input(s): LABPROT, INR in the last 72 hours. ABG No results for input(s): PHART, HCO3 in the last 72 hours.  Invalid input(s): PCO2, PO2  Studies/Results: Ct Renal Stone Study  Result Date: 07/08/2017 CLINICAL DATA:  Difficulty urinating over the last 3 days. Hematuria. EXAM: CT ABDOMEN AND PELVIS WITHOUT CONTRAST TECHNIQUE: Multidetector CT imaging of the abdomen and pelvis was performed following the standard protocol without IV contrast. COMPARISON:  09/08/2010 FINDINGS: Lower chest: Normal Hepatobiliary: Chronic benign cyst of the left lobe of the liver, unchanged. No calcified gallstones. Pancreas: Normal Spleen: Normal Adrenals/Urinary Tract: Adrenal glands are normal. Kidneys are normal. Foley catheter is present within the bladder. Bladder is full of hyperdense material consistent with blood. The prostate is massively enlarged. Mild asymmetric wall thickening at the dome of the bladder. Stomach/Bowel: Normal Vascular/Lymphatic: Aortic atherosclerosis.  Reproductive: Otherwise negative. Other: No free fluid or air. Musculoskeletal: Chronic degenerative spondylosis. 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 IMPRESSION: Bladder is distended with hyperdense material presumably representing blood clot. Foley catheter in the bladder. Massively enlarged prostate. Some asymmetric thickening of the bladder wall at the dome. Bladder wall mass not excluded by this exam. Electronically Signed   By: Nelson Chimes M.D.   On: 07/08/2017 07:56   I have discuss his case with the EDP and reviewed the CT films and report along with the labs and our office notes.   Assessment: BPH with BOO and clot retention.   Possible right bladder wall mass.   I will take him to the OR for cystoscopy and clot evacuation with possible fulguration.   I have reviewed the risks of bleeding, infection, bladder injury, strictures, need for secondary procedures, thrombotic events and anesthetic complications.     Acute blood loss anemia.   He will get transfused and admitted to the hospitalists.      CC: Dr.  April Palumbo and Dr. Nicolette Bang.     Irine Seal 07/08/2017 [phone]

## 2017-07-08 NOTE — Transfer of Care (Signed)
Immediate Anesthesia Transfer of Care Note  Patient: Jonathon Taylor  Procedure(s) Performed: Kathrene Alu CLOT EVACUATION AND FULGRATION (N/A Bladder)  Patient Location: PACU  Anesthesia Type:General  Level of Consciousness: awake, alert  and oriented  Airway & Oxygen Therapy: Patient Spontanous Breathing and Patient connected to face mask oxygen  Post-op Assessment: Report given to RN  Post vital signs: Reviewed and stable  Last Vitals:  Vitals Value Taken Time  BP 132/104 07/08/2017 12:37 PM  Temp    Pulse 75 07/08/2017 12:39 PM  Resp 17 07/08/2017 12:39 PM  SpO2 100 % 07/08/2017 12:39 PM  Vitals shown include unvalidated device data.  Last Pain:  Vitals:   07/08/17 1053  TempSrc:   PainSc: 3       Patients Stated Pain Goal: 3 (17/61/60 7371)  Complications: No apparent anesthesia complications

## 2017-07-08 NOTE — Progress Notes (Signed)
Patient ID: Jonathon Taylor, male   DOB: [birth year], 72 y.o.   MRN: [id number]   He is doing well post op with clear urine on slow CBI and no complaints.  His Hgb was 9.4 post transfusion and has drifted down to 9, but he has no further evidence of bleeding.  .BP 120/71 (BP Location: Right Arm)   Pulse 75   Temp 98.3 F (36.8 C)   Resp 14   Ht 5' 8.5" (1.74 m)   Wt 74.8 kg (165 lb)   SpO2 100%   BMI 24.72 kg/m   Imp:  Clot retention probably from extensive BPH.  Plan: If urine remains clear he could have a voiding trial in the morning.

## 2017-07-08 NOTE — Op Note (Signed)
Procedure: 1.  Cystoscopy with evacuation of clots and fulguration of bleeders. 2.  Biopsy of bladder neck.  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  Postop diagnosis: Same.  Surgeon: Dr. Irine Seal.  Anesthesia: General.  EBL: 500 mL of fresh clot were removed.  Drain: 24 [place] 3-way hematuria catheter.  Specimen: Biopsy chips from right bladder neck.  Complications: None.  Indications: Jonathon Taylor is a 72 year old white male patient of Dr. Saralyn Pilar McKenzie's with a history of BPH and bladder outlet obstruction with recurrent hematuria.  I was called to the emergency room this morning because the patient presented in retention and a CT scan demonstrated a bladder full of clots.  After reviewing the situation it was felt that operative removal of the clots with fulguration of any active bleeding was identified indicated.  Additionally there was a suggestion of thickness of the right bladder wall and it was felt the biopsy would be performed if abnormal areas were found.  Procedure: He was given 2 g of Ancef and 2 units of packed red cells during the procedure for preoperative hemoglobin of 8.2 with symptomatic hypotension on admission.  A general anesthetic was induced and he was placed in the lithotomy position.  His perineum and genitalia were prepped with Betadine solution and he was draped in the usual sterile fashion.  Cystoscopy was performed using the 23 [place] scope and 30 degree lens.  Examination revealed a normal urethra. His external sphincter was intact.  The prostatic urethra was approximately 6-7 cm in length with a large intravesical middle lobe.  Inspection of the bladder revealed a large amount of clot.  The ureteral orifice ease were identified and were away from the bladder neck.  Initially the source of bleeding was not readily identified, but it was felt to be from the prostate.  There was a fair amount of edema at the bladder neck  particularly on the right and some extension of the prostatic adenoma intravesically on the right.  The bladder wall was examined as best as possible with no lesions identified initially or subsequently once the clotted been removed.  The cystoscope was removed and the 28 French continuous-flow resectoscope sheath was inserted with the aid of the visual obturator.  A Toomey syringe was then used to evacuate the clot.  Intermittent inspection with the 30 degree lens and an Dennis handle with a bipolar loop was performed.  Water was initially used as the irrigant but the irrigant was then changed to normal saline to facilitate fulguration with a bipolar loop.  Once the clot is been evacuated he was noted to have some bleeding from the middle lobe but it was difficult to know if this was the original source of the bleeding or merely trauma from the cystoscopic procedure.  I fulgurated these areas of bleeding and evacuated more clot.  Once the entire clotted been removed, there appeared to be additional bleeding but it was difficult to pin down the source.  The right bladder neck area looks somewhat abnormal with edema and unusual extension of the tissue into the bladder neck.  I used the resectoscope to obtain 3 biopsies from this area and then generally fulgurated the biopsy sites which bled readily along with the sound surrounding mucosa.  Additional fulguration of the proximal prostatic urethra, the right and left bladder neck and prominent blood vessels in the trigone was performed.  Great care was taken to avoid ureteral orifice ease.  The 3 chips were  retrieved from the bladder to send for pathologic analysis.  Final inspection revealed no active bleeding no retained clots or chips and no other bladder wall lesions.  A 24 [place] coud hematuria catheter was placed and the balloon was filled with 30 cc of sterile fluid.  The catheter was irrigated with clear return and placed to straight drainage and  continuous irrigation with normal saline.  The patient was taken down from lithotomy position, his anesthetic was reversed and he was moved to the recovery room in stable condition.  There were no complications.

## 2017-07-08 NOTE — H&P (Addendum)
History and Physical    Jonathon Taylor:[id number] DOB: 06-03-1945 DOA: 07/08/2017  PCP: Gregor Hams, MD  Patient coming from: home  I have personally briefly reviewed patient's old medical records in [place]  Chief Complaint: hematuria  HPI: Jonathon Taylor is Jonathon Taylor 72 y.o. male with medical history significant of BPH, GERD, hematuria, seasonal allergies, inguinal hernia s/p repair presenting with hematuria and symptomatic anemia.   The patient notes that his symptoms started on Friday.  At that time he noted hematuria.  He notes that it took Jonathon Taylor lot of work to urinate and he had to make sure that he urinated frequently.  He has not urinated much since Friday.  He was passing clots and gross blood.  He spoke to the on-call doc for [facility] who recommended trying Abeer Deskins hot bath and prescribed Flomax and doxycycline.  He tried this for the past few days, but his symptoms progressively got worse.  This morning he felt sweaty, dizzy and generally poorly.  He denies any fevers, but does note some chills.  He denies chest pain or shortness of breath.  He notes that his abdomen felt tight and distended.  He also notes dysuria.    ED Course: Labs, IVF.  CT scan.  Urology c/s, plan for OR, request medicine admit for hematuria.   Review of Systems: As per HPI otherwise 10 point review of systems negative.   Past Medical History:  Diagnosis Date  . GERD (gastroesophageal reflux disease)   . H/O cardiac catheterization 2004   negative;Dr Tollie Eth  . Personal history of colonic polyps 08/12/2015  . Prostate enlargement   . Seasonal allergies   . Skin cancer    Dr [name]    Past Surgical History:  Procedure Laterality Date  . arthroscopic knee surgery Left 1995  . BASAL CELL CARCINOMA EXCISION     2000-2017  . CARDIAC CATHETERIZATION  02/27/2003   per patient " i had spot on symptoms of Jonathon Taylor heart attack and Jonathon Taylor specialist, dr Tollie Eth ,  was brought in and  after i had  the cath ; they said it was bad reflux . " per patient , after changing diet , no reccurrence of symptoms   . COLONOSCOPY    . INGUINAL HERNIA REPAIR Bilateral 03/12/2017   Procedure: LAPAROSCOPIC BILATERAL INGUINAL HERNIA WITH  MESH;  Surgeon: Clovis Riley, MD;  Location: [facility];  Service: General;  Laterality: Bilateral;  . INSERTION OF MESH N/Ixchel Duck 03/12/2017   Procedure: INSERTION OF MESH;  Surgeon: Clovis Riley, MD;  Location: [facility];  Service: General;  Laterality: N/Mable Lashley;     reports that he has never smoked. He has never used smokeless tobacco. He reports that he drinks alcohol. He reports that he does not use drugs.  No Known Allergies  Family History  Problem Relation Age of Onset  . Heart disease Mother   . Heart disease Maternal Grandfather   . Heart disease Father   . Heart disease Sister   . Colon cancer Neg Hx    Prior to Admission medications   Medication Sig Start Date End Date Taking? Authorizing Provider  ADVAIR DISKUS 100-50 MCG/DOSE AEPB INHALE 1 PUFF INTO THE LUNGS 2 TIMES DAILY Patient taking differently: INHALE 1 PUFF INTO THE LUNGS 2 TIMES DAILY AS NEEDED FOR SHORTNESS OF BREATHE. 06/03/17  Yes Gregor Hams, MD  albuterol (PROVENTIL HFA;VENTOLIN HFA) 108 (90 Base) MCG/ACT inhaler Inhale 2 puffs into  the lungs every 6 (six) hours as needed for wheezing. 04/27/15  Yes Leandrew Koyanagi, MD  doxycycline (VIBRAMYCIN) 100 MG capsule Take 1 capsule by mouth 2 (two) times daily. 07/07/17  Yes [provider]  fluticasone (FLONASE) 50 MCG/ACT nasal spray Place 2 sprays into both nostrils daily. Patient taking differently: Place 2 sprays into both nostrils daily as needed for allergies or rhinitis.  06/03/17  Yes Gregor Hams, MD  ibuprofen (ADVIL,MOTRIN) 200 MG tablet Take 400 mg by mouth every 6 (six) hours as needed for headache or moderate pain.   Yes [provider]  tamsulosin (FLOMAX) 0.4 MG CAPS capsule Take 0.4 mg by mouth daily.  07/06/17  Yes [provider]  triazolam (HALCION) 0.25 MG tablet Take 0.5 tablets (0.125 mg total) by mouth at bedtime as needed for sleep. 01/08/17  Yes Gregor Hams, MD  azithromycin (ZITHROMAX) 250 MG tablet Take 1 tablet (250 mg total) by mouth daily. Take first 2 tablets together, then 1 every day until finished. Patient not taking: Reported on 07/08/2017 05/28/17   Gregor Hams, MD    Physical Exam: Vitals:   07/08/17 0272 07/08/17 0609 07/08/17 0629 07/08/17 0731  BP: (!) 85/66  110/74 120/74  Pulse: (!) 102   86  Resp: 18   15  Temp: 98.4 F (36.9 C)     TempSrc: Oral     SpO2: 100%   100%  Weight:  74.8 kg (165 lb)    Height:  5' 8.5" (1.74 m)      Constitutional: NAD, calm, comfortable Vitals:   07/08/17 0608 07/08/17 0609 07/08/17 0629 07/08/17 0731  BP: (!) 85/66  110/74 120/74  Pulse: (!) 102   86  Resp: 18   15  Temp: 98.4 F (36.9 C)     TempSrc: Oral     SpO2: 100%   100%  Weight:  74.8 kg (165 lb)    Height:  5' 8.5" (1.74 m)     Eyes: PERRL, lids and conjunctivae normal ENMT: Mucous membranes are moist. Posterior pharynx clear of any exudate or lesions.Normal dentition.  Neck: normal, supple, no masses, no thyromegaly Respiratory: clear to auscultation bilaterally, no wheezing, no crackles. Normal respiratory effort. No accessory muscle use.  Cardiovascular: Regular rate and rhythm, no murmurs / rubs / gallops. No extremity edema. 2+ pedal pulses. No carotid bruits.  Abdomen: mild suprapubic TTP, no masses palpated. No hepatosplenomegaly. Bowel sounds positive.  Musculoskeletal: no clubbing / cyanosis. No joint deformity upper and lower extremities. Good ROM, no contractures. Normal muscle tone.  Skin: no rashes, lesions, ulcers. No induration Neurologic: CN 2-12 grossly intact. Sensation intact. Strength 5/5 in all 4.  Psychiatric: Normal judgment and insight. Alert and oriented x 3. Normal mood.   Labs on Admission: I have personally reviewed  following labs and imaging studies  CBC: Recent Labs  Lab 07/08/17 0703 07/08/17 0709  WBC 7.1  --   NEUTROABS 6.1  --   HGB 8.1* 8.2*  HCT 24.2* 24.0*  MCV 82.9  --   PLT 203  --    Basic Metabolic Panel: Recent Labs  Lab 07/08/17 0703 07/08/17 0709  NA 129* 130*  K 4.7 4.6  CL 97* 94*  CO2 24  --   GLUCOSE 165* 159*  BUN 24* 25*  CREATININE 1.01 1.00  CALCIUM 8.3*  --    GFR: Estimated Creatinine Clearance: 67.7 mL/min (by C-G formula based on SCr of 1 mg/dL). Liver Function  Tests: Recent Labs  Lab 07/08/17 0703  AST 22  ALT 14*  ALKPHOS 31*  BILITOT 0.9  PROT 5.7*  ALBUMIN 3.5   No results for input(s): LIPASE, AMYLASE in the last 168 hours. No results for input(s): AMMONIA in the last 168 hours. Coagulation Profile: No results for input(s): INR, PROTIME in the last 168 hours. Cardiac Enzymes: No results for input(s): CKTOTAL, CKMB, CKMBINDEX, TROPONINI in the last 168 hours. BNP (last 3 results) No results for input(s): PROBNP in the last 8760 hours. HbA1C: No results for input(s): HGBA1C in the last 72 hours. CBG: No results for input(s): GLUCAP in the last 168 hours. Lipid Profile: No results for input(s): CHOL, HDL, LDLCALC, TRIG, CHOLHDL, LDLDIRECT in the last 72 hours. Thyroid Function Tests: No results for input(s): TSH, T4TOTAL, FREET4, T3FREE, THYROIDAB in the last 72 hours. Anemia Panel: No results for input(s): VITAMINB12, FOLATE, FERRITIN, TIBC, IRON, RETICCTPCT in the last 72 hours. Urine analysis:    Component Value Date/Time   COLORURINE RED (Aleana Fifita) 07/08/2017 0616   APPEARANCEUR TURBID (Rolanda Campa) 07/08/2017 0616   LABSPEC  07/08/2017 0616    TEST NOT REPORTED DUE TO COLOR INTERFERENCE OF URINE PIGMENT   PHURINE  07/08/2017 0616    TEST NOT REPORTED DUE TO COLOR INTERFERENCE OF URINE PIGMENT   GLUCOSEU (Maris Bena) 07/08/2017 0616    TEST NOT REPORTED DUE TO COLOR INTERFERENCE OF URINE PIGMENT   HGBUR (Lasha Echeverria) 07/08/2017 0616    TEST NOT REPORTED DUE TO  COLOR INTERFERENCE OF URINE PIGMENT   BILIRUBINUR (Taleshia Luff) 07/08/2017 0616    TEST NOT REPORTED DUE TO COLOR INTERFERENCE OF URINE PIGMENT   BILIRUBINUR small 07/17/2016 1434   KETONESUR (Renisha Cockrum) 07/08/2017 0616    TEST NOT REPORTED DUE TO COLOR INTERFERENCE OF URINE PIGMENT   PROTEINUR (Ashiah Karpowicz) 07/08/2017 0616    TEST NOT REPORTED DUE TO COLOR INTERFERENCE OF URINE PIGMENT   UROBILINOGEN 0.2 07/17/2016 1434   NITRITE (Azaya Goedde) 07/08/2017 0616    TEST NOT REPORTED DUE TO COLOR INTERFERENCE OF URINE PIGMENT   LEUKOCYTESUR (Samiya Mervin) 07/08/2017 0616    TEST NOT REPORTED DUE TO COLOR INTERFERENCE OF URINE PIGMENT    Radiological Exams on Admission: Ct Renal Stone Study  Result Date: 07/08/2017 CLINICAL DATA:  Difficulty urinating over the last 3 days. Hematuria. EXAM: CT ABDOMEN AND PELVIS WITHOUT CONTRAST TECHNIQUE: Multidetector CT imaging of the abdomen and pelvis was performed following the standard protocol without IV contrast. COMPARISON:  09/08/2010 FINDINGS: Lower chest: Normal Hepatobiliary: Chronic benign cyst of the left lobe of the liver, unchanged. No calcified gallstones. Pancreas: Normal Spleen: Normal Adrenals/Urinary Tract: Adrenal glands are normal. Kidneys are normal. Foley catheter is present within the bladder. Bladder is full of hyperdense material consistent with blood. The prostate is massively enlarged. Mild asymmetric wall thickening at the dome of the bladder. Stomach/Bowel: Normal Vascular/Lymphatic: Aortic atherosclerosis. Reproductive: Otherwise negative. Other: No free fluid or air. Musculoskeletal: Chronic degenerative spondylosis. 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 IMPRESSION: Bladder is distended with hyperdense material presumably representing blood clot. Foley catheter in the bladder. Massively enlarged prostate. Some asymmetric thickening of the bladder wall at the dome. Bladder wall mass not excluded by this exam. Electronically Signed   By: Nelson Chimes M.D.   On:  07/08/2017 07:56    EKG: Independently reviewed. pending  Assessment/Plan Active Problems:   Hematuria  Hematuria  Asymmetric bladder wall thickening  Urinary Retention  Possible UTI: Follows with urology as an outpatient, sounds like he's  had previous w/u for hematuria years ago?  He's not on blood thinners or aspirin, takes NSAIDs about every other day.  He's had about 1 L of grossly bloody fluid out since foley placed.   Urology c/s, appreciate recs (planning to take to OR) Flomax Ceftriaxone  Follow urine cx  Symptomatic Anemia  Hypotension: this AM, sweating, dizzy, malaise.  BP 85/66 on presentation to ED, but improved since then.  Likely 2/2 ABLA due to above.   S/p 2 L NS in ED Transfuse 1 unit pRBC now Check post transfusion H/H Follow serial H/H  BPH: continue flomax  Hyponatremia: mild, likely 2/2 hypovolemia.  Follow.  Hyperglycemia: no hx of DM, follow A1c.  SSI.  Reactive Airway Disease  Allergies: not currently taking advair or albuterol  Insomnia: triazolam  DVT prophylaxis: SCD  Code Status: full  Family Communication: wife at bedside  Disposition Plan: pending improvement Consults called: urology, Jeffie Pollock Admission status: inpatient due to likely need for 2 midnights with symptomatic anemia with active bleeding   Fayrene Helper MD [facility] Pager [phone]  If 7PM-7AM, please contact night-coverage [email or web address] Password [facility]  07/08/2017, 9:42 AM

## 2017-07-08 NOTE — Anesthesia Preprocedure Evaluation (Signed)
Anesthesia Evaluation  Patient identified by MRN, date of birth, ID band Patient awake    Reviewed: Allergy & Precautions, NPO status , Patient's Chart, lab work & pertinent test results  Airway Mallampati: II  TM Distance: >3 FB Neck ROM: Full    Dental  (+) Teeth Intact, Dental Advisory Given   Pulmonary    breath sounds clear to auscultation       Cardiovascular  Rhythm:Regular Rate:Normal     Neuro/Psych    GI/Hepatic   Endo/Other    Renal/GU      Musculoskeletal   Abdominal   Peds  Hematology   Anesthesia Other Findings   Reproductive/Obstetrics                             Anesthesia Physical Anesthesia Plan  ASA: III and emergent  Anesthesia Plan: General   Post-op Pain Management:    Induction: Intravenous  PONV Risk Score and Plan: Ondansetron and Dexamethasone  Airway Management Planned: Oral ETT  Additional Equipment:   Intra-op Plan:   Post-operative Plan: Extubation in OR  Informed Consent: I have reviewed the patients History and Physical, chart, labs and discussed the procedure including the risks, benefits and alternatives for the proposed anesthesia with the patient or authorized representative who has indicated his/her understanding and acceptance.   Dental advisory given  Plan Discussed with: CRNA and Anesthesiologist  Anesthesia Plan Comments:         Anesthesia Quick Evaluation

## 2017-07-08 NOTE — Anesthesia Procedure Notes (Signed)
Procedure Name: Intubation Date/Time: 07/08/2017 11:45 AM Performed by: Lavina Hamman, CRNA Pre-anesthesia Checklist: Patient identified, Emergency Drugs available, Suction available, Patient being monitored and Timeout performed Patient Re-evaluated:Patient Re-evaluated prior to induction Oxygen Delivery Method: Circle system utilized Preoxygenation: Pre-oxygenation with 100% oxygen Induction Type: IV induction Ventilation: Mask ventilation without difficulty Laryngoscope Size: Mac and 4 Grade View: Grade I Tube type: Oral Tube size: 7.0 mm Number of attempts: 1 Airway Equipment and Method: Stylet Placement Confirmation: ETT inserted through vocal cords under direct vision,  positive ETCO2,  CO2 detector and breath sounds checked- equal and bilateral Secured at: 22 cm Tube secured with: Tape Dental Injury: Teeth and Oropharynx as per pre-operative assessment

## 2017-07-08 NOTE — ED Triage Notes (Signed)
Pt reports having difficulty passing urine for the last 3 days. Pt reports having hx of enlarged prostate. Pt also reports dizzy and passing blood when urinating.

## 2017-07-08 NOTE — ED Notes (Signed)
Report given to Floor RN 

## 2017-07-08 NOTE — Plan of Care (Signed)
  Problem: Urinary Elimination: Goal: Ability to avoid or minimize complications of infection will improve Outcome: Progressing   Problem: Health Behavior/Discharge Planning: Goal: Ability to manage health-related needs will improve Outcome: Progressing   Problem: Clinical Measurements: Goal: Ability to maintain clinical measurements within normal limits will improve Outcome: Progressing Goal: Will remain free from infection Outcome: Progressing Goal: Diagnostic test results will improve Outcome: Progressing Goal: Respiratory complications will improve Outcome: Progressing Goal: Cardiovascular complication will be avoided Outcome: Progressing   Problem: Activity: Goal: Risk for activity intolerance will decrease Outcome: Progressing   Problem: Nutrition: Goal: Adequate nutrition will be maintained Outcome: Progressing   Problem: Coping: Goal: Level of anxiety will decrease Outcome: Progressing   Problem: Safety: Goal: Ability to remain free from injury will improve Outcome: Progressing   Problem: Skin Integrity: Goal: Risk for impaired skin integrity will decrease Outcome: Progressing

## 2017-07-09 DIAGNOSIS — R310 Gross hematuria: Secondary | ICD-10-CM

## 2017-07-09 LAB — TYPE AND SCREEN
ABO/RH(D): A NEG
Antibody Screen: NEGATIVE
Unit division: 0
Unit division: 0

## 2017-07-09 LAB — COMPREHENSIVE METABOLIC PANEL
ALBUMIN: 2.7 g/dL — AB (ref 3.5–5.0)
ALK PHOS: 24 U/L — AB (ref 38–126)
ALT: 13 U/L — ABNORMAL LOW (ref 17–63)
ANION GAP: 5 (ref 5–15)
AST: 15 U/L (ref 15–41)
BILIRUBIN TOTAL: 0.6 mg/dL (ref 0.3–1.2)
BUN: 9 mg/dL (ref 6–20)
CALCIUM: 8 mg/dL — AB (ref 8.9–10.3)
CO2: 26 mmol/L (ref 22–32)
Chloride: 107 mmol/L (ref 101–111)
Creatinine, Ser: 0.78 mg/dL (ref 0.61–1.24)
GFR calc Af Amer: >60 mL/min (ref >60)
GFR calc non Af Amer: >60 mL/min (ref >60)
GLUCOSE: 99 mg/dL (ref 65–99)
Potassium: 4 mmol/L (ref 3.5–5.1)
Sodium: 138 mmol/L (ref 135–145)
TOTAL PROTEIN: 4.5 g/dL — AB (ref 6.5–8.1)

## 2017-07-09 LAB — BPAM RBC
Blood Product Expiration Date: 201904242359
Blood Product Expiration Date: 201905162359
ISSUE DATE / TIME: 201904151102
ISSUE DATE / TIME: 201904151102
UNIT TYPE AND RH: 600
Unit Type and Rh: 600

## 2017-07-09 LAB — GLUCOSE, CAPILLARY
GLUCOSE-CAPILLARY: 97 mg/dL (ref 65–99)
GLUCOSE-CAPILLARY: 131 mg/dL — AB (ref 65–99)

## 2017-07-09 LAB — URINE CULTURE: Culture: NO GROWTH

## 2017-07-09 LAB — CBC
HEMATOCRIT: 24.9 % — AB (ref 39.0–52.0)
Hemoglobin: 8.1 g/dL — ABNORMAL LOW (ref 13.0–17.0)
MCH: 27.7 pg (ref 26.0–34.0)
MCHC: 32.5 g/dL (ref 30.0–36.0)
MCV: 85.3 fL (ref 78.0–100.0)
PLATELETS: 146 10*3/uL — AB (ref 150–400)
RBC: 2.92 MIL/uL — ABNORMAL LOW (ref 4.22–5.81)
RDW: 16.3 % — AB (ref 11.5–15.5)
WBC: 9.3 10*3/uL (ref 4.0–10.5)

## 2017-07-09 MED ORDER — BISACODYL 10 MG RE SUPP
10.0000 mg | Freq: Every day | RECTAL | Status: DC | PRN
  Filled 2017-07-09: qty 1

## 2017-07-09 MED ORDER — FINASTERIDE 5 MG PO TABS: 5.0000 mg | ORAL_TABLET | Freq: Every day | ORAL | 0 refills | Status: DC

## 2017-07-09 NOTE — Progress Notes (Signed)
Patient given discharge instructions, and verbalized an understanding of all discharge instructions.  Patient agrees with discharge plan, and is being discharged in stable medical condition.  Patient given transportation via wheelchair. 

## 2017-07-09 NOTE — Discharge Summary (Signed)
Physician Discharge Summary  Jonathon Taylor VVO:[id number] DOB: Apr 28, 1945 DOA: 07/08/2017  PCP: Gregor Hams, MD  Admit date: 07/08/2017 Discharge date: 07/09/2017  Admitted From: Home Disposition:  Home  Recommendations for Outpatient Follow-up:  1. Follow up with [facility]  2. Follow-up on cystoscopy biopsy results  Discharge Condition: Stable CODE STATUS: Full Diet recommendation: Regular   Brief/Interim Summary: Per Dr. Florene Glen: Jonathon Taylor is a 72 y.o. male with medical history significant of BPH, GERD, hematuria, seasonal allergies, inguinal hernia s/p repair presenting with hematuria and symptomatic anemia. The patient notes that his symptoms started on Friday.  At that time he noted hematuria.  He notes that it took a lot of work to urinate and he had to make sure that he urinated frequently.  He has not urinated much since Friday.  He was passing clots and gross blood.  He spoke to the on-call doc for [facility] who recommended trying a hot bath and prescribed Flomax and doxycycline.  He tried this for the past few days, but his symptoms progressively got worse.  This morning he felt sweaty, dizzy and generally poorly.  He denies any fevers, but does note some chills.  He denies chest pain or shortness of breath.  He notes that his abdomen felt tight and distended.  He also notes dysuria.    Interim: Patient underwent cystoscopy with evacuation of clots and integration of bleeders as well as biopsy of bladder neck.  Patient was transfused 2 units packed red blood cells. Foley catheter was placed, patient underwent continuous bladder irrigation with clearance of hematuria.  Urine culture resulted no growth and antibiotic was stopped.  Patient discharged home with Foley catheter in place, he is to follow-up with urology as outpatient.  This was discussed with patient and wife at bedside.   Discharge Diagnoses:  Principal Problem:   Hematuria Active Problems:    RAD (reactive airway disease)   Insomnia   BPH (benign prostatic hyperplasia)   Acute blood loss anemia   Acute urinary retention   Discharge Instructions  Discharge Instructions    Call MD for:  difficulty breathing, headache or visual disturbances   Complete by:  As directed    Call MD for:  extreme fatigue   Complete by:  As directed    Call MD for:  persistant dizziness or light-headedness   Complete by:  As directed    Call MD for:  persistant nausea and vomiting   Complete by:  As directed    Call MD for:  severe uncontrolled pain   Complete by:  As directed    Call MD for:  temperature >100.4   Complete by:  As directed    Diet general   Complete by:  As directed    Discharge instructions   Complete by:  As directed    You were cared for by a hospitalist during your hospital stay. If you have any questions about your discharge medications or the care you received while you were in the hospital after you are discharged, you can call the unit and asked to speak with the hospitalist on call if the hospitalist that took care of you is not available. Once you are discharged, your primary care physician will handle any further medical issues. Please note that NO REFILLS for any discharge medications will be authorized once you are discharged, as it is imperative that you return to your primary care physician (or establish a relationship with a primary care  physician if you do not have one) for your aftercare needs so that they can reassess your need for medications and monitor your lab values.   Increase activity slowly   Complete by:  As directed      Allergies as of 07/09/2017   No Known Allergies     Medication List    STOP taking these medications   azithromycin 250 MG tablet Commonly known as:  ZITHROMAX   doxycycline 100 MG capsule Commonly known as:  VIBRAMYCIN     TAKE these medications   ADVAIR DISKUS 100-50 MCG/DOSE Aepb Generic drug:   Fluticasone-Salmeterol INHALE 1 PUFF INTO THE LUNGS 2 TIMES DAILY What changed:  See the new instructions.   albuterol 108 (90 Base) MCG/ACT inhaler Commonly known as:  PROVENTIL HFA;VENTOLIN HFA Inhale 2 puffs into the lungs every 6 (six) hours as needed for wheezing.   finasteride 5 MG tablet Commonly known as:  PROSCAR Take 1 tablet (5 mg total) by mouth daily. Start taking on:  07/10/2017   fluticasone 50 MCG/ACT nasal spray Commonly known as:  FLONASE Place 2 sprays into both nostrils daily. What changed:    when to take this  reasons to take this   ibuprofen 200 MG tablet Commonly known as:  ADVIL,MOTRIN Take 400 mg by mouth every 6 (six) hours as needed for headache or moderate pain.   tamsulosin 0.4 MG Caps capsule Commonly known as:  FLOMAX Take 0.4 mg by mouth daily.   triazolam 0.25 MG tablet Commonly known as:  HALCION Take 0.5 tablets (0.125 mg total) by mouth at bedtime as needed for sleep.      Follow-up Information    Gregor Hams, MD .   Specialties:  Family Medicine, Emergency Medicine Contact information: [address] [place] [address] [phone]        Irine Seal, MD. Call.   Specialty:  Urology Contact information: [place] [address] [phone]          No Known Allergies  Consultations:  Urology    Procedures/Studies: Ct Renal Stone Study  Result Date: 07/08/2017 CLINICAL DATA:  Difficulty urinating over the last 3 days. Hematuria. EXAM: CT ABDOMEN AND PELVIS WITHOUT CONTRAST TECHNIQUE: Multidetector CT imaging of the abdomen and pelvis was performed following the standard protocol without IV contrast. COMPARISON:  09/08/2010 FINDINGS: Lower chest: Normal Hepatobiliary: Chronic benign cyst of the left lobe of the liver, unchanged. No calcified gallstones. Pancreas: Normal Spleen: Normal Adrenals/Urinary Tract: Adrenal glands are normal. Kidneys are normal. Foley catheter is present  within the bladder. Bladder is full of hyperdense material consistent with blood. The prostate is massively enlarged. Mild asymmetric wall thickening at the dome of the bladder. Stomach/Bowel: Normal Vascular/Lymphatic: Aortic atherosclerosis. Reproductive: Otherwise negative. Other: No free fluid or air. Musculoskeletal: Chronic degenerative spondylosis. 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 IMPRESSION: Bladder is distended with hyperdense material presumably representing blood clot. Foley catheter in the bladder. Massively enlarged prostate. Some asymmetric thickening of the bladder wall at the dome. Bladder wall mass not excluded by this exam. Electronically Signed   By: Nelson Chimes M.D.   On: 07/08/2017 07:56     Discharge Exam: Vitals:   07/08/17 2018 07/09/17 0549  BP: 103/60 111/67  Pulse: 78 63  Resp: 17 16  Temp: 98.2 F (36.8 C) 98.1 F (36.7 C)  SpO2: 98% 99%   General: Pt is alert, awake, not in acute distress Cardiovascular: RRR, S1/S2 +,  no rubs, no gallops Respiratory: CTA bilaterally, no wheezing, no rhonchi Abdominal: Soft, NT, ND, bowel sounds + Extremities: no edema, no cyanosis    The results of significant diagnostics from this hospitalization (including imaging, microbiology, ancillary and laboratory) are listed below for reference.     Microbiology: Recent Results (from the past 240 hour(s))  Culture, Urine     Status: None   Collection Time: 07/08/17  6:16 AM  Result Value Ref Range Status   Specimen Description   Final    URINE, CLEAN CATCH Performed at [facility], [place] [address] [place], [place] [address]    Special Requests   Final    NONE Performed at [facility], [place] [address], [place], [place] [address]    Culture   Final    NO GROWTH Performed at [facility], [place] [address], [place], [place] [address]    Report Status 07/09/2017 FINAL  Final  Surgical PCR screen     Status:  None   Collection Time: 07/08/17 10:21 AM  Result Value Ref Range Status   MRSA, PCR NEGATIVE NEGATIVE Final   Staphylococcus aureus NEGATIVE NEGATIVE Final    Comment: (NOTE) The Xpert SA Assay (FDA approved for NASAL specimens in patients 38 years of age and older), is one component of a comprehensive surveillance program. It is not intended to diagnose infection nor to guide or monitor treatment. Performed at [facility], [place] [address], [place], [place] [address]      Labs: BNP (last 3 results) No results for input(s): BNP in the last 8760 hours. Basic Metabolic Panel: Recent Labs  Lab 07/08/17 0703 07/08/17 0709 07/09/17 0508  NA 129* 130* 138  K 4.7 4.6 4.0  CL 97* 94* 107  CO2 24  --  26  GLUCOSE 165* 159* 99  BUN 24* 25* 9  CREATININE 1.01 1.00 0.78  CALCIUM 8.3*  --  8.0*   Liver Function Tests: Recent Labs  Lab 07/08/17 0703 07/09/17 0508  AST 22 15  ALT 14* 13*  ALKPHOS 31* 24*  BILITOT 0.9 0.6  PROT 5.7* 4.5*  ALBUMIN 3.5 2.7*   No results for input(s): LIPASE, AMYLASE in the last 168 hours. No results for input(s): AMMONIA in the last 168 hours. CBC: Recent Labs  Lab 07/08/17 0703 07/08/17 0709 07/08/17 1259 07/08/17 1517 07/09/17 0508  WBC 7.1  --   --   --  9.3  NEUTROABS 6.1  --   --   --   --   HGB 8.1* 8.2* 9.4* 9.0* 8.1*  HCT 24.2* 24.0* 28.2* 26.7* 24.9*  MCV 82.9  --   --   --  85.3  PLT 203  --   --   --  146*   Cardiac Enzymes: No results for input(s): CKTOTAL, CKMB, CKMBINDEX, TROPONINI in the last 168 hours. BNP: Invalid input(s): POCBNP CBG: Recent Labs  Lab 07/08/17 2209 07/09/17 0738 07/09/17 1215  GLUCAP 93 97 131*   D-Dimer No results for input(s): DDIMER in the last 72 hours. Hgb A1c Recent Labs    07/08/17 0710  HGBA1C 5.2   Lipid Profile No results for input(s): CHOL, HDL, LDLCALC, TRIG, CHOLHDL, LDLDIRECT in the last 72 hours. Thyroid function studies No results for input(s):  TSH, T4TOTAL, T3FREE, THYROIDAB in the last 72 hours.  Invalid input(s): FREET3 Anemia work up No results for input(s): VITAMINB12, FOLATE, FERRITIN, TIBC, IRON, RETICCTPCT in the last 72 hours. Urinalysis  Component Value Date/Time   COLORURINE RED (A) 07/08/2017 0616   APPEARANCEUR TURBID (A) 07/08/2017 0616   LABSPEC  07/08/2017 0616    TEST NOT REPORTED DUE TO COLOR INTERFERENCE OF URINE PIGMENT   PHURINE  07/08/2017 0616    TEST NOT REPORTED DUE TO COLOR INTERFERENCE OF URINE PIGMENT   GLUCOSEU (A) 07/08/2017 0616    TEST NOT REPORTED DUE TO COLOR INTERFERENCE OF URINE PIGMENT   HGBUR (A) 07/08/2017 0616    TEST NOT REPORTED DUE TO COLOR INTERFERENCE OF URINE PIGMENT   BILIRUBINUR (A) 07/08/2017 0616    TEST NOT REPORTED DUE TO COLOR INTERFERENCE OF URINE PIGMENT   BILIRUBINUR small 07/17/2016 1434   KETONESUR (A) 07/08/2017 0616    TEST NOT REPORTED DUE TO COLOR INTERFERENCE OF URINE PIGMENT   PROTEINUR (A) 07/08/2017 0616    TEST NOT REPORTED DUE TO COLOR INTERFERENCE OF URINE PIGMENT   UROBILINOGEN 0.2 07/17/2016 1434   NITRITE (A) 07/08/2017 0616    TEST NOT REPORTED DUE TO COLOR INTERFERENCE OF URINE PIGMENT   LEUKOCYTESUR (A) 07/08/2017 0616    TEST NOT REPORTED DUE TO COLOR INTERFERENCE OF URINE PIGMENT   Sepsis Labs Invalid input(s): PROCALCITONIN,  WBC,  LACTICIDVEN Microbiology Recent Results (from the past 240 hour(s))  Culture, Urine     Status: None   Collection Time: 07/08/17  6:16 AM  Result Value Ref Range Status   Specimen Description   Final    URINE, CLEAN CATCH Performed at [facility], [place] [address] [place], [place] [address]    Special Requests   Final    NONE Performed at [facility], [place] [address], [place], [place] [address]    Culture   Final    NO GROWTH Performed at [facility], [place] [address] [place], [place] [address]    Report Status 07/09/2017 FINAL  Final  Surgical PCR screen      Status: None   Collection Time: 07/08/17 10:21 AM  Result Value Ref Range Status   MRSA, PCR NEGATIVE NEGATIVE Final   Staphylococcus aureus NEGATIVE NEGATIVE Final    Comment: (NOTE) The Xpert SA Assay (FDA approved for NASAL specimens in patients 64 years of age and older), is one component of a comprehensive surveillance program. It is not intended to diagnose infection nor to guide or monitor treatment. Performed at [facility], [place] [address], [place], [place] [address]      Patient was seen and examined on the day of discharge and was found to be in stable condition. Time coordinating discharge: 40 minutes including assessment and coordination of care, as well as examination of the patient.   SIGNED:  Dessa Phi, DO [facility] Pager [phone]  If 7PM-7AM, please contact night-coverage [email or web address] Password [facility] 07/09/2017, 3:23 PM

## 2017-07-10 NOTE — Anesthesia Postprocedure Evaluation (Signed)
Anesthesia Post Note  Patient: Jonathon Taylor  Procedure(s) Performed: CYSTO CLOT EVACUATION AND FULGRATION (N/A Bladder)     Patient location during evaluation: PACU Anesthesia Type: General Level of consciousness: awake Pain management: pain level controlled Vital Signs Assessment: post-procedure vital signs reviewed and stable Respiratory status: spontaneous breathing Cardiovascular status: stable Anesthetic complications: no    Last Vitals:  Vitals:   07/08/17 2018 07/09/17 0549  BP: 103/60 111/67  Pulse: 78 63  Resp: 17 16  Temp: 36.8 C 36.7 C  SpO2: 98% 99%    Last Pain:  Vitals:   07/09/17 1000  TempSrc:   PainSc: 0-No pain                 Dinisha Cai

## 2017-07-15 DIAGNOSIS — R351 Nocturia: Secondary | ICD-10-CM | POA: Diagnosis not present

## 2017-07-15 DIAGNOSIS — N401 Enlarged prostate with lower urinary tract symptoms: Secondary | ICD-10-CM | POA: Diagnosis not present

## 2017-07-15 DIAGNOSIS — R310 Gross hematuria: Secondary | ICD-10-CM | POA: Diagnosis not present

## 2017-07-22 DIAGNOSIS — R3915 Urgency of urination: Secondary | ICD-10-CM | POA: Diagnosis not present

## 2017-07-22 DIAGNOSIS — N401 Enlarged prostate with lower urinary tract symptoms: Secondary | ICD-10-CM | POA: Diagnosis not present

## 2017-07-22 DIAGNOSIS — R350 Frequency of micturition: Secondary | ICD-10-CM | POA: Diagnosis not present

## 2017-07-25 ENCOUNTER — Other Ambulatory Visit: Payer: Self-pay | Admitting: Urology

## 2017-07-30 ENCOUNTER — Telehealth: Payer: Self-pay | Admitting: Family Medicine

## 2017-07-30 NOTE — Telephone Encounter (Signed)
I received a letter from [facility] urology requesting surgical clearance for upcoming robotic prostatectomy.  You are in good health but based on your age and history of asthma I think is reasonable to have an in person visit.  Please schedule an appointment with me in the near future so that we can make sure your risk is low prior to surgery.  Date of surgery September 19, 2017.  Surgeon Dr. Nicolette Bang at [facility] urology

## 2017-07-30 NOTE — Telephone Encounter (Signed)
Pt advised and transferred to scheduler to make appt.

## 2017-07-30 NOTE — Telephone Encounter (Signed)
Left message on patient vm to call back regarding this. Rhonda Cunningham,CMA

## 2017-07-31 ENCOUNTER — Encounter: Payer: Self-pay | Admitting: Family Medicine

## 2017-07-31 ENCOUNTER — Ambulatory Visit (INDEPENDENT_AMBULATORY_CARE_PROVIDER_SITE_OTHER): Payer: BLUE CROSS/BLUE SHIELD | Admitting: Family Medicine

## 2017-07-31 VITALS — BP 128/76 | HR 73 | Ht 68.0 in | Wt 163.0 lb

## 2017-07-31 DIAGNOSIS — D62 Acute posthemorrhagic anemia: Secondary | ICD-10-CM

## 2017-07-31 DIAGNOSIS — R310 Gross hematuria: Secondary | ICD-10-CM | POA: Diagnosis not present

## 2017-07-31 DIAGNOSIS — N401 Enlarged prostate with lower urinary tract symptoms: Secondary | ICD-10-CM

## 2017-07-31 DIAGNOSIS — R350 Frequency of micturition: Secondary | ICD-10-CM

## 2017-07-31 DIAGNOSIS — D508 Other iron deficiency anemias: Secondary | ICD-10-CM | POA: Diagnosis not present

## 2017-07-31 NOTE — Patient Instructions (Signed)
Thank you for coming in today. Ask Dr Roni Bread if he would recommend Dr Tresa Moore sooner than [facility].  If you cant get it done sooner we can probably get it sooner at [facility].

## 2017-07-31 NOTE — Progress Notes (Signed)
Jonathon Taylor is a 72 y.o. male who presents to [facility] today for BPH and hematuria.  Jonathon Taylor has had a medically complicated last month or so.  He had gross hematuria that eventually was discovered to be due to his enlarged prostate.  He is quite symptomatic still even after evacuation of a large blood clot from his bladder.  He has an elective robot prostatectomy scheduled for the end of June.  He notes that he is quite symptomatic and wonders if he can schedule surgery sooner.  He notes that he continues to be physically fit.  He can climb 2 flights of stairs without any chest pain or shortness of breath.  His only medical issue is the hematuria and the acute anemia from blood loss resulting from his hematuria.  He had a 2 unit red blood cell transfusion prior to leaving the hospital on April 17.  He feels pretty well with no chest pain palpitations or severe shortness of breath.  He notes continued problematic pelvic pressure.  He notes bowel movements are painful and he is using lots of fiber to help avoid straining.    Past Medical History:  Diagnosis Date  . GERD (gastroesophageal reflux disease)   . H/O cardiac catheterization 2004   negative;Dr Tollie Eth  . Personal history of colonic polyps 08/12/2015  . Prostate enlargement   . Seasonal allergies   . Skin cancer    Dr [name]   Past Surgical History:  Procedure Laterality Date  . arthroscopic knee surgery Left 1995  . BASAL CELL CARCINOMA EXCISION     2000-2017  . CARDIAC CATHETERIZATION  02/27/2003   per patient " i had spot on symptoms of a heart attack and a specialist, dr Tollie Eth ,  was brought in and after i had  the cath ; they said it was bad reflux . " per patient , after changing diet , no reccurrence of symptoms   . COLONOSCOPY    . INGUINAL HERNIA REPAIR Bilateral 03/12/2017     Procedure: LAPAROSCOPIC BILATERAL INGUINAL HERNIA WITH  MESH;  Surgeon: Clovis Riley, MD;  Location: [facility];  Service: General;  Laterality: Bilateral;  . INSERTION OF MESH N/A 03/12/2017   Procedure: INSERTION OF MESH;  Surgeon: Clovis Riley, MD;  Location: [facility];  Service: General;  Laterality: N/A;  . TRANSURETHRAL RESECTION OF BLADDER TUMOR N/A 07/08/2017   Procedure: CYSTO CLOT EVACUATION AND FULGRATION;  Surgeon: Irine Seal, MD;  Location: [facility];  Service: Urology;  Laterality: N/A;   Social History   Tobacco Use  . Smoking status: Never Smoker  . Smokeless tobacco: Never Used  Substance Use Topics  . Alcohol use: Yes    Alcohol/week: 0.0 oz    Comment: 2 glasses of red wine nightly   family history includes Heart disease in his father, maternal grandfather, mother, and sister.  ROS as above:  Medications: Current Outpatient Medications  Medication Sig Dispense Refill  . ADVAIR DISKUS 100-50 MCG/DOSE AEPB INHALE 1 PUFF INTO THE LUNGS 2 TIMES DAILY (Patient taking differently: INHALE 1 PUFF INTO THE LUNGS 2 TIMES DAILY AS NEEDED FOR SHORTNESS OF BREATHE.) 60 each 10  . albuterol (PROVENTIL HFA;VENTOLIN HFA) 108 (90 Base) MCG/ACT inhaler Inhale 2 puffs into the lungs every 6 (six) hours as needed for wheezing. 1 Inhaler 11  . finasteride (PROSCAR) 5 MG tablet Take 1 tablet (5 mg total) by  mouth daily. 30 tablet 0  . fluticasone (FLONASE) 50 MCG/ACT nasal spray Place 2 sprays into both nostrils daily. (Patient taking differently: Place 2 sprays into both nostrils daily as needed for allergies or rhinitis. ) 16 g 11  . ibuprofen (ADVIL,MOTRIN) 200 MG tablet Take 400 mg by mouth every 6 (six) hours as needed for headache or moderate pain.    . tamsulosin (FLOMAX) 0.4 MG CAPS capsule Take 0.4 mg by mouth 2 (two) times daily.   0  . triazolam (HALCION) 0.25 MG tablet Take 0.5 tablets (0.125 mg total) by mouth at bedtime as needed for sleep. 30 tablet 4   No current  facility-administered medications for this visit.    No Known Allergies  Health Maintenance Health Maintenance  Topic Date Due  . INFLUENZA VACCINE  10/24/2017  . COLONOSCOPY  08/08/2018  . TETANUS/TDAP  05/02/2026  . Hepatitis C Screening  Completed  . PNA vac Low Risk Adult  Completed     Exam:  BP 128/76   Pulse 73   Ht 5\' 8"  (1.727 m)   Wt 163 lb (73.9 kg)   BMI 24.78 kg/m  Gen: Well NAD HEENT: EOMI,  MMM Lungs: Normal work of breathing. CTABL Heart: RRR no MRG Abd: NABS, Soft. Nondistended, Nontender Exts: Brisk capillary refill, warm and well perfused.   Twelve-lead EKG from December reviewed     Assessment and Plan: 72 y.o. male with  BPH hematuria and pelvic pressure.  Pending prostatectomy.  This is reasonable however patient is still pretty symptomatic and I have asked him to contact his urology office deceiving get a sooner appointment.  If he just cannot get worked in sooner by either his urologist or 1 of the colleagues it is possible Mr. Bart will consider second opinion at [facility] to check CBC metabolic panel and iron studies. Will send letter for surgical clearance after labs are back if all is well.   Orders Placed This Encounter  Procedures  . CBC  . COMPLETE METABOLIC PANEL WITH GFR  . Fe+TIBC+Fer   No orders of the defined types were placed in this encounter.    Discussed warning signs or symptoms. Please see discharge instructions. Patient expresses understanding.  CC: Dr Alyson Ingles at [facility] [phone]

## 2017-08-01 LAB — IRON,TIBC AND FERRITIN PANEL
%SAT: 6 % — AB (ref 15–60)
Ferritin: 13 ng/mL — ABNORMAL LOW (ref 20–380)
IRON: 20 ug/dL — AB (ref 50–180)
TIBC: 340 ug/dL (ref 250–425)

## 2017-08-01 LAB — COMPLETE METABOLIC PANEL WITH GFR
AG Ratio: 1.4 (calc) (ref 1.0–2.5)
ALKALINE PHOSPHATASE (APISO): 61 U/L (ref 40–115)
ALT: 10 U/L (ref 9–46)
AST: 12 U/L (ref 10–35)
Albumin: 3.7 g/dL (ref 3.6–5.1)
BILIRUBIN TOTAL: 0.3 mg/dL (ref 0.2–1.2)
BUN: 16 mg/dL (ref 7–25)
CHLORIDE: 104 mmol/L (ref 98–110)
CO2: 28 mmol/L (ref 20–32)
Calcium: 8.8 mg/dL (ref 8.6–10.3)
Creat: 0.86 mg/dL (ref 0.70–1.18)
GFR, Est African American: 102 mL/min/{1.73_m2} (ref >=60)
GFR, Est Non African American: 88 mL/min/{1.73_m2} (ref >=60)
GLUCOSE: 89 mg/dL (ref 65–99)
Globulin: 2.6 g/dL (calc) (ref 1.9–3.7)
POTASSIUM: 4.2 mmol/L (ref 3.5–5.3)
Sodium: 138 mmol/L (ref 135–146)
TOTAL PROTEIN: 6.3 g/dL (ref 6.1–8.1)

## 2017-08-01 LAB — CBC
HEMATOCRIT: 33 % — AB (ref 38.5–50.0)
Hemoglobin: 10.6 g/dL — ABNORMAL LOW (ref 13.2–17.1)
MCH: 26.2 pg — AB (ref 27.0–33.0)
MCHC: 32.1 g/dL (ref 32.0–36.0)
MCV: 81.5 fL (ref 80.0–100.0)
MPV: 10 fL (ref 7.5–12.5)
PLATELETS: 427 10*3/uL — AB (ref 140–400)
RBC: 4.05 10*6/uL — AB (ref 4.20–5.80)
RDW: 13.8 % (ref 11.0–15.0)
WBC: 5.8 10*3/uL (ref 3.8–10.8)

## 2017-08-02 DIAGNOSIS — D509 Iron deficiency anemia, unspecified: Secondary | ICD-10-CM | POA: Insufficient documentation

## 2017-08-07 ENCOUNTER — Encounter: Payer: Self-pay | Admitting: Family Medicine

## 2017-08-15 ENCOUNTER — Encounter: Payer: Self-pay | Admitting: Family Medicine

## 2017-08-22 ENCOUNTER — Other Ambulatory Visit: Payer: Self-pay | Admitting: Family Medicine

## 2017-08-24 DEATH — deceased

## 2017-09-02 ENCOUNTER — Encounter: Payer: Self-pay | Admitting: Family Medicine

## 2017-09-02 MED ORDER — TRIAZOLAM 0.25 MG PO TABS: ORAL_TABLET | ORAL | 5 refills | Status: DC

## 2017-09-12 DIAGNOSIS — B9689 Other specified bacterial agents as the cause of diseases classified elsewhere: Secondary | ICD-10-CM | POA: Diagnosis not present

## 2017-09-12 DIAGNOSIS — R310 Gross hematuria: Secondary | ICD-10-CM | POA: Diagnosis not present

## 2017-09-12 DIAGNOSIS — N390 Urinary tract infection, site not specified: Secondary | ICD-10-CM | POA: Diagnosis not present

## 2017-09-13 NOTE — Progress Notes (Signed)
EKG 07-08-17 Epic

## 2017-09-13 NOTE — Patient Instructions (Signed)
Jonathon Taylor  09/13/2017   Your procedure is scheduled on: 09-19-17   Report to [facility]  Entrance    Report to admitting at 5:30AM    Call this number if you have problems the morning of surgery [phone]     Remember: Do not eat food After Midnight ON Tuesday 09-17-17. DRINK PLENTY OF CLEAR LIQUIDS ALL DAY Wednesday 09-19-15 AND FOLLOW ALL BOWEL PREP ORDERS PROVIDED BY YOUR SURGEON. NOTHING BY MOUTH AFTER MIDNIGHT ON [facility]!      CLEAR LIQUID DIET   Foods Allowed                                                                     Foods Excluded  Coffee and tea, regular and decaf                             liquids that you cannot  Plain Jell-O in any flavor                                             see through such as: Fruit ices (not with fruit pulp)                                     milk, soups, orange juice  Iced Popsicles                                    All solid food Carbonated beverages, regular and diet                                    Cranberry, grape and apple juices Sports drinks like Gatorade Lightly seasoned clear broth or consume(fat free) Sugar, honey syrup  Sample Menu Breakfast                                Lunch                                     Supper Cranberry juice                    Beef broth                            Chicken broth Jell-O                                     Grape juice  Apple juice Coffee or tea                        Jell-O                                      Popsicle                                                Coffee or tea                        Coffee or tea  _____________________________________________________________________     Take these medicines the morning of surgery with A SIP OF WATER: FINASTERIDE, TAMSULOSIN, ADVAIR INHALER, ALBUTEROL INHALER, FLONASE NASAL SPRAY                                 You may not have any metal on your body  including hair pins and              piercings  Do not wear jewelry, make-up, lotions, powders or perfumes, deodorant                          Men may shave face and neck.   Do not bring valuables to the hospital. [place].  Contacts, dentures or bridgework may not be worn into surgery.  Leave suitcase in the car. After surgery it may be brought to your room.                  Please read over the following fact sheets you were given: _____________________________________________________________________             [facility] - Preparing for Surgery Before surgery, you can play an important role.  Because skin is not sterile, your skin needs to be as free of germs as possible.  You can reduce the number of germs on your skin by washing with CHG (chlorahexidine gluconate) soap before surgery.  CHG is an antiseptic cleaner which kills germs and bonds with the skin to continue killing germs even after washing. Please DO NOT use if you have an allergy to CHG or antibacterial soaps.  If your skin becomes reddened/irritated stop using the CHG and inform your nurse when you arrive at Short Stay. Do not shave (including legs and underarms) for at least 48 hours prior to the first CHG shower.  You may shave your face/neck. Please follow these instructions carefully:  1.  Shower with CHG Soap the night before surgery and the  morning of Surgery.  2.  If you choose to wash your hair, wash your hair first as usual with your  normal  shampoo.  3.  After you shampoo, rinse your hair and body thoroughly to remove the  shampoo.                           4.  Use CHG as you would any other liquid soap.  You can apply  chg directly  to the skin and wash                       Gently with a scrungie or clean washcloth.  5.  Apply the CHG Soap to your body ONLY FROM THE NECK DOWN.   Do not use on face/ open                           Wound or open sores. Avoid  contact with eyes, ears mouth and genitals (private parts).                       Wash face,  Genitals (private parts) with your normal soap.             6.  Wash thoroughly, paying special attention to the area where your surgery  will be performed.  7.  Thoroughly rinse your body with warm water from the neck down.  8.  DO NOT shower/wash with your normal soap after using and rinsing off  the CHG Soap.                9.  Pat yourself dry with a clean towel.            10.  Wear clean pajamas.            11.  Place clean sheets on your bed the night of your first shower and do not  sleep with pets. Day of Surgery : Do not apply any lotions/deodorants the morning of surgery.  Please wear clean clothes to the hospital/surgery center.  FAILURE TO FOLLOW THESE INSTRUCTIONS MAY RESULT IN THE CANCELLATION OF YOUR SURGERY PATIENT SIGNATURE_________________________________  NURSE SIGNATURE__________________________________  ________________________________________________________________________

## 2017-09-16 ENCOUNTER — Encounter (HOSPITAL_COMMUNITY): Payer: Self-pay

## 2017-09-16 ENCOUNTER — Encounter (HOSPITAL_COMMUNITY)
Admission: RE | Admit: 2017-09-16 | Discharge: 2017-09-16 | Disposition: A | Discharge status: Home / Self Care | Payer: BLUE CROSS/BLUE SHIELD | Source: Ambulatory Visit | Attending: Urology | Admitting: Urology

## 2017-09-16 ENCOUNTER — Other Ambulatory Visit: Payer: Self-pay

## 2017-09-16 DIAGNOSIS — Z01812 Encounter for preprocedural laboratory examination: Secondary | ICD-10-CM | POA: Insufficient documentation

## 2017-09-16 DIAGNOSIS — N400 Enlarged prostate without lower urinary tract symptoms: Secondary | ICD-10-CM | POA: Diagnosis not present

## 2017-09-16 HISTORY — DX: Anemia, unspecified: D64.9

## 2017-09-16 LAB — CBC
HCT: 42.1 % (ref 39.0–52.0)
Hemoglobin: 13.2 g/dL (ref 13.0–17.0)
MCH: 25.4 pg — ABNORMAL LOW (ref 26.0–34.0)
MCHC: 31.4 g/dL (ref 30.0–36.0)
MCV: 81 fL (ref 78.0–100.0)
Platelets: 303 10*3/uL (ref 150–400)
RBC: 5.2 MIL/uL (ref 4.22–5.81)
RDW: 18 % — AB (ref 11.5–15.5)
WBC: 5.7 10*3/uL (ref 4.0–10.5)

## 2017-09-18 ENCOUNTER — Encounter: Payer: Self-pay | Admitting: Family Medicine

## 2017-09-18 NOTE — Anesthesia Preprocedure Evaluation (Addendum)
Anesthesia Evaluation  Patient identified by MRN, date of birth, ID band Patient awake    Reviewed: Allergy & Precautions, NPO status , Patient's Chart, lab work & pertinent test results  Airway Mallampati: II       Dental no notable dental hx. (+) Teeth Intact   Pulmonary neg pulmonary ROS,    Pulmonary exam normal        Cardiovascular negative cardio ROS Normal cardiovascular exam Rhythm:Regular Rate:Normal     Neuro/Psych negative neurological ROS  negative psych ROS   GI/Hepatic negative GI ROS, Neg liver ROS,   Endo/Other  negative endocrine ROS  Renal/GU negative Renal ROS  negative genitourinary   Musculoskeletal negative musculoskeletal ROS (+)   Abdominal   Peds negative pediatric ROS (+)  Hematology negative hematology ROS (+) anemia ,   Anesthesia Other Findings   Reproductive/Obstetrics negative OB ROS                             Anesthesia Physical Anesthesia Plan  ASA: II  Anesthesia Plan: General   Post-op Pain Management:    Induction: Intravenous  PONV Risk Score and Plan: 2 and Ondansetron  Airway Management Planned: Oral ETT  Additional Equipment:   Intra-op Plan:   Post-operative Plan: Extubation in OR  Informed Consent: I have reviewed the patients History and Physical, chart, labs and discussed the procedure including the risks, benefits and alternatives for the proposed anesthesia with the patient or authorized representative who has indicated his/her understanding and acceptance.   Dental advisory given  Plan Discussed with: CRNA  Anesthesia Plan Comments:         Anesthesia Quick Evaluation

## 2017-09-19 ENCOUNTER — Inpatient Hospital Stay (HOSPITAL_COMMUNITY): Payer: BLUE CROSS/BLUE SHIELD | Admitting: Anesthesiology

## 2017-09-19 ENCOUNTER — Encounter (HOSPITAL_COMMUNITY)
Admission: RE | Disposition: A | Discharge status: Home / Self Care | Payer: Self-pay | Source: Home / Self Care | Attending: Urology

## 2017-09-19 ENCOUNTER — Inpatient Hospital Stay (HOSPITAL_COMMUNITY)
Admission: RE | Admit: 2017-09-19 | Discharge: 2017-09-21 | DRG: 717 | Disposition: A | Discharge status: Home / Self Care | Payer: BLUE CROSS/BLUE SHIELD | Attending: Urology | Admitting: Urology

## 2017-09-19 ENCOUNTER — Other Ambulatory Visit: Payer: Self-pay

## 2017-09-19 ENCOUNTER — Encounter (HOSPITAL_COMMUNITY): Payer: Self-pay | Admitting: *Deleted

## 2017-09-19 DIAGNOSIS — R319 Hematuria, unspecified: Secondary | ICD-10-CM | POA: Diagnosis not present

## 2017-09-19 DIAGNOSIS — R310 Gross hematuria: Secondary | ICD-10-CM | POA: Diagnosis not present

## 2017-09-19 DIAGNOSIS — R399 Unspecified symptoms and signs involving the genitourinary system: Secondary | ICD-10-CM | POA: Diagnosis not present

## 2017-09-19 DIAGNOSIS — Z87891 Personal history of nicotine dependence: Secondary | ICD-10-CM | POA: Diagnosis not present

## 2017-09-19 DIAGNOSIS — N138 Other obstructive and reflux uropathy: Secondary | ICD-10-CM | POA: Diagnosis present

## 2017-09-19 DIAGNOSIS — N401 Enlarged prostate with lower urinary tract symptoms: Secondary | ICD-10-CM | POA: Diagnosis not present

## 2017-09-19 DIAGNOSIS — N400 Enlarged prostate without lower urinary tract symptoms: Secondary | ICD-10-CM | POA: Diagnosis not present

## 2017-09-19 DIAGNOSIS — D500 Iron deficiency anemia secondary to blood loss (chronic): Secondary | ICD-10-CM | POA: Diagnosis not present

## 2017-09-19 DIAGNOSIS — Z85828 Personal history of other malignant neoplasm of skin: Secondary | ICD-10-CM

## 2017-09-19 DIAGNOSIS — K219 Gastro-esophageal reflux disease without esophagitis: Secondary | ICD-10-CM | POA: Diagnosis present

## 2017-09-19 DIAGNOSIS — R140 Abdominal distension (gaseous): Secondary | ICD-10-CM | POA: Diagnosis present

## 2017-09-19 DIAGNOSIS — Z86010 Personal history of colonic polyps: Secondary | ICD-10-CM

## 2017-09-19 DIAGNOSIS — Z8249 Family history of ischemic heart disease and other diseases of the circulatory system: Secondary | ICD-10-CM | POA: Diagnosis not present

## 2017-09-19 DIAGNOSIS — R338 Other retention of urine: Secondary | ICD-10-CM | POA: Diagnosis not present

## 2017-09-19 HISTORY — PX: XI ROBOTIC ASSISTED SIMPLE PROSTATECTOMY: SHX6713

## 2017-09-19 LAB — TYPE AND SCREEN
ABO/RH(D): A NEG
Antibody Screen: NEGATIVE

## 2017-09-19 LAB — HEMOGLOBIN AND HEMATOCRIT, BLOOD
HCT: 39 % (ref 39.0–52.0)
Hemoglobin: 12.3 g/dL — ABNORMAL LOW (ref 13.0–17.0)

## 2017-09-19 SURGERY — PROSTATECTOMY, SIMPLE, ROBOT-ASSISTED
Anesthesia: General | Site: Prostate

## 2017-09-19 MED ORDER — SULFAMETHOXAZOLE-TRIMETHOPRIM 800-160 MG PO TABS: 1.0000 | ORAL_TABLET | Freq: Two times a day (BID) | ORAL | 0 refills | Status: DC

## 2017-09-19 MED ORDER — FENTANYL CITRATE (PF) 250 MCG/5ML IJ SOLN
INTRAMUSCULAR | Status: AC
  Filled 2017-09-19: qty 5

## 2017-09-19 MED ORDER — FENTANYL CITRATE (PF) 100 MCG/2ML IJ SOLN
INTRAMUSCULAR | Status: DC | PRN
  Administered 2017-09-19: 50 ug via INTRAVENOUS
  Administered 2017-09-19: 75 ug via INTRAVENOUS
  Administered 2017-09-19 (×2): 50 ug via INTRAVENOUS
  Administered 2017-09-19: 25 ug via INTRAVENOUS

## 2017-09-19 MED ORDER — LACTATED RINGERS IR SOLN
Status: DC | PRN
Start: 2017-09-19 — End: 2017-09-19
  Administered 2017-09-19: 1000 mL

## 2017-09-19 MED ORDER — ALBUTEROL SULFATE (2.5 MG/3ML) 0.083% IN NEBU: 2.5000 mg | INHALATION_SOLUTION | Freq: Four times a day (QID) | RESPIRATORY_TRACT | Status: DC | PRN

## 2017-09-19 MED ORDER — HYDROMORPHONE HCL 1 MG/ML IJ SOLN
INTRAMUSCULAR | Status: AC
  Filled 2017-09-19: qty 1

## 2017-09-19 MED ORDER — ACETAMINOPHEN 10 MG/ML IV SOLN: 1000.0000 mg | Freq: Once | INTRAVENOUS | Status: DC | PRN

## 2017-09-19 MED ORDER — LIDOCAINE 2% (20 MG/ML) 5 ML SYRINGE
INTRAMUSCULAR | Status: AC
  Filled 2017-09-19: qty 5

## 2017-09-19 MED ORDER — ONDANSETRON HCL 4 MG/2ML IJ SOLN
INTRAMUSCULAR | Status: AC
  Filled 2017-09-19: qty 2

## 2017-09-19 MED ORDER — BSS IO SOLN
15.0000 mL | Freq: Once | INTRAOCULAR | Status: DC
  Filled 2017-09-19: qty 15

## 2017-09-19 MED ORDER — ROCURONIUM BROMIDE 100 MG/10ML IV SOLN
INTRAVENOUS | Status: AC
  Filled 2017-09-19: qty 1

## 2017-09-19 MED ORDER — ONDANSETRON HCL 4 MG/2ML IJ SOLN
INTRAMUSCULAR | Status: DC | PRN
  Administered 2017-09-19: 4 mg via INTRAVENOUS

## 2017-09-19 MED ORDER — SUGAMMADEX SODIUM 200 MG/2ML IV SOLN
INTRAVENOUS | Status: AC
  Filled 2017-09-19: qty 2

## 2017-09-19 MED ORDER — DEXAMETHASONE SODIUM PHOSPHATE 10 MG/ML IJ SOLN
INTRAMUSCULAR | Status: AC
  Filled 2017-09-19: qty 1

## 2017-09-19 MED ORDER — DIPHENHYDRAMINE HCL 12.5 MG/5ML PO ELIX
12.5000 mg | ORAL_SOLUTION | Freq: Four times a day (QID) | ORAL | Status: DC | PRN
  Administered 2017-09-19: 12.5 mg via ORAL
  Filled 2017-09-19: qty 5

## 2017-09-19 MED ORDER — LIDOCAINE 2% (20 MG/ML) 5 ML SYRINGE
INTRAMUSCULAR | Status: DC | PRN
  Administered 2017-09-19: 100 mg via INTRAVENOUS

## 2017-09-19 MED ORDER — HYDROCODONE-ACETAMINOPHEN 7.5-325 MG PO TABS
ORAL_TABLET | ORAL | Status: AC
  Filled 2017-09-19: qty 1

## 2017-09-19 MED ORDER — CEFAZOLIN SODIUM-DEXTROSE 2-4 GM/100ML-% IV SOLN
2.0000 g | INTRAVENOUS | Status: AC
  Administered 2017-09-19: 2 g via INTRAVENOUS
  Filled 2017-09-19 (×2): qty 100

## 2017-09-19 MED ORDER — SODIUM CHLORIDE 0.9 % IJ SOLN
INTRAMUSCULAR | Status: AC
  Filled 2017-09-19: qty 10

## 2017-09-19 MED ORDER — DEXAMETHASONE SODIUM PHOSPHATE 10 MG/ML IJ SOLN
INTRAMUSCULAR | Status: DC | PRN
  Administered 2017-09-19: 10 mg via INTRAVENOUS

## 2017-09-19 MED ORDER — SODIUM CHLORIDE 0.9 % IJ SOLN
INTRAMUSCULAR | Status: DC | PRN
Start: 2017-09-19 — End: 2017-09-19
  Administered 2017-09-19: 20 mL

## 2017-09-19 MED ORDER — MEPERIDINE HCL 50 MG/ML IJ SOLN: 6.2500 mg | INTRAMUSCULAR | Status: DC | PRN

## 2017-09-19 MED ORDER — SODIUM CHLORIDE 0.9 % IV BOLUS
1000.0000 mL | Freq: Once | INTRAVENOUS | Status: AC
  Administered 2017-09-19: 1000 mL via INTRAVENOUS

## 2017-09-19 MED ORDER — ROCURONIUM BROMIDE 10 MG/ML (PF) SYRINGE
PREFILLED_SYRINGE | INTRAVENOUS | Status: DC | PRN
  Administered 2017-09-19: 10 mg via INTRAVENOUS
  Administered 2017-09-19: 40 mg via INTRAVENOUS
  Administered 2017-09-19: 20 mg via INTRAVENOUS

## 2017-09-19 MED ORDER — DEXTROSE-NACL 5-0.45 % IV SOLN
INTRAVENOUS | Status: DC
  Administered 2017-09-19 – 2017-09-20 (×2): via INTRAVENOUS

## 2017-09-19 MED ORDER — HYDROMORPHONE HCL 1 MG/ML IJ SOLN: 0.5000 mg | INTRAMUSCULAR | Status: DC | PRN

## 2017-09-19 MED ORDER — BUPIVACAINE LIPOSOME 1.3 % IJ SUSP
20.0000 mL | Freq: Once | INTRAMUSCULAR | Status: AC
  Administered 2017-09-19: 20 mL
  Filled 2017-09-19: qty 20

## 2017-09-19 MED ORDER — SUGAMMADEX SODIUM 200 MG/2ML IV SOLN
INTRAVENOUS | Status: DC | PRN
  Administered 2017-09-19: 150 mg via INTRAVENOUS

## 2017-09-19 MED ORDER — HYDROCODONE-ACETAMINOPHEN 7.5-325 MG PO TABS
1.0000 | ORAL_TABLET | Freq: Once | ORAL | Status: AC | PRN
  Administered 2017-09-19: 1 via ORAL

## 2017-09-19 MED ORDER — EPHEDRINE 5 MG/ML INJ
INTRAVENOUS | Status: AC
  Filled 2017-09-19: qty 10

## 2017-09-19 MED ORDER — ACETAMINOPHEN 500 MG PO TABS
1000.0000 mg | ORAL_TABLET | Freq: Once | ORAL | Status: AC
  Administered 2017-09-19: 1000 mg via ORAL
  Filled 2017-09-19: qty 2

## 2017-09-19 MED ORDER — PROPOFOL 10 MG/ML IV BOLUS
INTRAVENOUS | Status: AC
  Filled 2017-09-19: qty 20

## 2017-09-19 MED ORDER — OXYCODONE HCL 5 MG PO TABS
5.0000 mg | ORAL_TABLET | ORAL | Status: DC | PRN
  Administered 2017-09-19 – 2017-09-20 (×4): 5 mg via ORAL
  Filled 2017-09-19 (×5): qty 1

## 2017-09-19 MED ORDER — PROMETHAZINE HCL 25 MG/ML IJ SOLN: 6.2500 mg | INTRAMUSCULAR | Status: DC | PRN

## 2017-09-19 MED ORDER — LACTATED RINGERS IV SOLN
INTRAVENOUS | Status: DC
  Administered 2017-09-19 – 2017-09-20 (×2): via INTRAVENOUS

## 2017-09-19 MED ORDER — FINASTERIDE 5 MG PO TABS
5.0000 mg | ORAL_TABLET | Freq: Every day | ORAL | Status: DC
  Administered 2017-09-19 – 2017-09-21 (×3): 5 mg via ORAL
  Filled 2017-09-19 (×3): qty 1

## 2017-09-19 MED ORDER — PROPOFOL 10 MG/ML IV BOLUS
INTRAVENOUS | Status: DC | PRN
  Administered 2017-09-19: 140 mg via INTRAVENOUS

## 2017-09-19 MED ORDER — MAGNESIUM CITRATE PO SOLN: 1.0000 | Freq: Once | ORAL | Status: DC

## 2017-09-19 MED ORDER — KETOROLAC TROMETHAMINE 0.5 % OP SOLN
1.0000 [drp] | Freq: Three times a day (TID) | OPHTHALMIC | Status: DC | PRN
  Filled 2017-09-19: qty 5

## 2017-09-19 MED ORDER — EPHEDRINE SULFATE-NACL 50-0.9 MG/10ML-% IV SOSY
PREFILLED_SYRINGE | INTRAVENOUS | Status: DC | PRN
  Administered 2017-09-19 (×2): 5 mg via INTRAVENOUS

## 2017-09-19 MED ORDER — ACETAMINOPHEN 325 MG PO TABS
650.0000 mg | ORAL_TABLET | ORAL | Status: DC | PRN
  Administered 2017-09-20 – 2017-09-21 (×2): 650 mg via ORAL
  Filled 2017-09-19 (×2): qty 2

## 2017-09-19 MED ORDER — MOMETASONE FURO-FORMOTEROL FUM 100-5 MCG/ACT IN AERO
2.0000 | INHALATION_SPRAY | Freq: Two times a day (BID) | RESPIRATORY_TRACT | Status: DC
  Filled 2017-09-19: qty 8.8

## 2017-09-19 MED ORDER — DIPHENHYDRAMINE HCL 50 MG/ML IJ SOLN
12.5000 mg | Freq: Four times a day (QID) | INTRAMUSCULAR | Status: DC | PRN
  Filled 2017-09-19: qty 1

## 2017-09-19 MED ORDER — HYDROCODONE-ACETAMINOPHEN 5-325 MG PO TABS: 1.0000 | ORAL_TABLET | Freq: Four times a day (QID) | ORAL | 0 refills | Status: DC | PRN

## 2017-09-19 MED ORDER — MIDAZOLAM HCL 5 MG/5ML IJ SOLN
INTRAMUSCULAR | Status: DC | PRN
  Administered 2017-09-19: 2 mg via INTRAVENOUS

## 2017-09-19 MED ORDER — MIDAZOLAM HCL 2 MG/2ML IJ SOLN
INTRAMUSCULAR | Status: AC
  Filled 2017-09-19: qty 2

## 2017-09-19 MED ORDER — GABAPENTIN 300 MG PO CAPS
300.0000 mg | ORAL_CAPSULE | Freq: Once | ORAL | Status: AC
  Administered 2017-09-19: 300 mg via ORAL
  Filled 2017-09-19: qty 1

## 2017-09-19 MED ORDER — ONDANSETRON HCL 4 MG/2ML IJ SOLN: 4.0000 mg | INTRAMUSCULAR | Status: DC | PRN

## 2017-09-19 MED ORDER — STERILE WATER FOR IRRIGATION IR SOLN
Status: DC | PRN
  Administered 2017-09-19: 1000 mL

## 2017-09-19 MED ORDER — HYDROMORPHONE HCL 1 MG/ML IJ SOLN
0.2500 mg | INTRAMUSCULAR | Status: DC | PRN
  Administered 2017-09-19 (×3): 0.5 mg via INTRAVENOUS

## 2017-09-19 SURGICAL SUPPLY — 50 items
CATH FOLEY 2WAY SLVR  5CC 18FR (CATHETERS) ×1
CATH FOLEY 2WAY SLVR 5CC 18FR (CATHETERS) ×1 IMPLANT
CATH FOLEY 3WAY 30CC 22FR (CATHETERS) ×2 IMPLANT
CHLORAPREP W/TINT 26ML (MISCELLANEOUS) ×2 IMPLANT
CLOTH BEACON ORANGE TIMEOUT ST (SAFETY) ×2 IMPLANT
COVER SURGICAL LIGHT HANDLE (MISCELLANEOUS) ×2 IMPLANT
COVER TIP SHEARS 8 DVNC (MISCELLANEOUS) ×1 IMPLANT
COVER TIP SHEARS 8MM DA VINCI (MISCELLANEOUS) ×1
DECANTER SPIKE VIAL GLASS SM (MISCELLANEOUS) ×2 IMPLANT
DERMABOND ADVANCED (GAUZE/BANDAGES/DRESSINGS) ×1
DERMABOND ADVANCED .7 DNX12 (GAUZE/BANDAGES/DRESSINGS) ×1 IMPLANT
DRAPE ARM DVNC X/XI (DISPOSABLE) ×4 IMPLANT
DRAPE COLUMN DVNC XI (DISPOSABLE) ×1 IMPLANT
DRAPE DA VINCI XI ARM (DISPOSABLE) ×4
DRAPE DA VINCI XI COLUMN (DISPOSABLE) ×1
DRAPE SURG IRRIG POUCH 19X23 (DRAPES) ×2 IMPLANT
DRSG TEGADERM 4X4.75 (GAUZE/BANDAGES/DRESSINGS) ×2 IMPLANT
ELECT REM PT RETURN 15FT ADLT (MISCELLANEOUS) ×2 IMPLANT
GAUZE SPONGE 2X2 8PLY STRL LF (GAUZE/BANDAGES/DRESSINGS) ×1 IMPLANT
GLOVE BIO SURGEON STRL SZ 6.5 (GLOVE) ×2 IMPLANT
GLOVE BIO SURGEON STRL SZ8 (GLOVE) ×4 IMPLANT
GLOVE BIOGEL PI IND STRL 8 (GLOVE) ×2 IMPLANT
GLOVE BIOGEL PI INDICATOR 8 (GLOVE) ×2
GOWN STRL REUS W/TWL LRG LVL3 (GOWN DISPOSABLE) ×6 IMPLANT
HOLDER FOLEY CATH W/STRAP (MISCELLANEOUS) ×2 IMPLANT
IRRIG SUCT STRYKERFLOW 2 WTIP (MISCELLANEOUS) ×2
IRRIGATION SUCT STRKRFLW 2 WTP (MISCELLANEOUS) ×1 IMPLANT
NEEDLE INSUFFLATION 14GA 120MM (NEEDLE) ×2 IMPLANT
PACK ROBOT UROLOGY CUSTOM (CUSTOM PROCEDURE TRAY) ×2 IMPLANT
PAD POSITIONING PINK XL (MISCELLANEOUS) ×2 IMPLANT
SEAL CANN UNIV 5-8 DVNC XI (MISCELLANEOUS) ×4 IMPLANT
SEAL XI 5MM-8MM UNIVERSAL (MISCELLANEOUS) ×4
SET CYSTO W/LG BORE CLAMP LF (SET/KITS/TRAYS/PACK) ×2 IMPLANT
SET IRRIG Y TYPE TUR BLADDER L (SET/KITS/TRAYS/PACK) IMPLANT
SOLUTION ELECTROLUBE (MISCELLANEOUS) ×2 IMPLANT
SPONGE GAUZE 2X2 STER 10/PKG (GAUZE/BANDAGES/DRESSINGS) ×1
SPONGE LAP 4X18 RFD (DISPOSABLE) IMPLANT
SUT ETHILON 3 0 PS 1 (SUTURE) ×2 IMPLANT
SUT MNCRL AB 4-0 PS2 18 (SUTURE) ×4 IMPLANT
SUT V-LOC BARB 180 2/0GR6 GS22 (SUTURE) ×4
SUT VIC AB 0 CT1 27 (SUTURE) ×7
SUT VIC AB 0 CT1 27XBRD ANTBC (SUTURE) ×7 IMPLANT
SUT VIC AB 2-0 SH 27 (SUTURE) ×1
SUT VIC AB 2-0 SH 27X BRD (SUTURE) ×1 IMPLANT
SUT VICRYL 0 UR6 27IN ABS (SUTURE) ×2 IMPLANT
SUT VLOC BARB 180 ABS3/0GR12 (SUTURE)
SUTURE V-LC BRB 180 2/0GR6GS22 (SUTURE) ×2 IMPLANT
SUTURE VLOC BRB 180 ABS3/0GR12 (SUTURE) IMPLANT
TOWEL OR 17X26 10 PK STRL BLUE (TOWEL DISPOSABLE) ×2 IMPLANT
TOWEL OR NON WOVEN STRL DISP B (DISPOSABLE) ×2 IMPLANT

## 2017-09-19 NOTE — Anesthesia Procedure Notes (Signed)
Procedure Name: Intubation Date/Time: 09/19/2017 7:46 AM Performed by: Victoriano Lain, CRNA Pre-anesthesia Checklist: Patient identified, Suction available, Emergency Drugs available, Patient being monitored and Timeout performed Patient Re-evaluated:Patient Re-evaluated prior to induction Oxygen Delivery Method: Circle system utilized Preoxygenation: Pre-oxygenation with 100% oxygen Induction Type: IV induction Ventilation: Mask ventilation without difficulty Laryngoscope Size: Mac and 4 Grade View: Grade I Tube type: Oral Tube size: 7.5 mm Number of attempts: 1 Airway Equipment and Method: Stylet Placement Confirmation: ETT inserted through vocal cords under direct vision,  positive ETCO2 and breath sounds checked- equal and bilateral Secured at: 22 cm Tube secured with: Tape Dental Injury: Teeth and Oropharynx as per pre-operative assessment

## 2017-09-19 NOTE — Discharge Instructions (Signed)

## 2017-09-19 NOTE — Op Note (Signed)
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 Robotic-assisted laparoscopic simple prostatectomy.  ANESTHESIA: General  ATTENDING: Nicolette Bang, MD  ASSISTANT: Clemetine Marker, PA  RESIDENT: Case Clydene Laming, MD  ESTIMATED BLOOD LOSS: 350 mL.  COMPLICATIONS: None.  SPECIMEN: 1.prostatic adenoma  ANTIBIOTICS: ancef  FINDINGS: 3cm intravesical prostatic protrusion. Ureteral orifices in normal anatomic location. No leaks from cystotomy at 100cc of water. The assistent was utilized for suction, retraction, and passing sutures  DRAINS: 1. Jackson-Pratt drain to bulb suction. 2. Foley catheter to straight drain.  INDICATION: Jonathon Taylor is a very pleasant 72 year old gentleman, who has BPH with significant LUTS including elevated PVR with gross hematuria. His TRUS volume is 110cc.  Options were discussed with the patient in detail for primary manage including continued surveillance protocols versus surgical extirpation with and without minimally invasive assistance and he wished to proceed with robotic simple prostatectomy. Informed consent was obtained and placed in the medical record.  PROCEDURE IN DETAIL: The patient was brought to the operating room and a breif timeout was down to ensure correct patient, correct procedure, and correct site. Intravenous antibiotics were administered. General endotracheal anesthesia was introduced. The patient was placed into a low lithotomy position after tucking his arms with foam padding, placing on a pink and non-slide foam pad. A test of steep Trendelenburg positioning was performed and he was found to be suitably positioned. Sterile field was created by prepping and draping the patient's penis, perineum and proximal thighs using iodine and his infra-xiphoid abdomen using chlorhexidine gluconate. Next, a high-flow, low-pressure pneumoperitoneum was obtained  using Veress technique in the infraumbilical midline having passed the aspiration and drop test. Next, a 8-mm robotic camera port was placed in the same location. Laparoscopic examination of the peritoneal cavity revealed no significant adhesions and no visceral injury. Additional ports were placed as follows: Right paramedian 8-mm robotic port, right far lateral 12-mm assistant port, left paramedian 8-mm robotic port, left far lateral 8-mm robotic port, and right paramedian 5-mm suction port. Robot was docked and passed through the electronic checks. Next, attention was directed for the development of space of Retzius. Incision was made lateral to the left medial umbilical ligament from the midline towards the area of the internal ring and coursing along the iliac vessels towards the area of the ureter, which was positively identified. The left bladder was dissected away from the pelvic sidewall towards the area of the endopelvic fascia. A mirror-imaged dissection was performed on the right side.  Additional anterior attachments were taken down using cautery scissors. Next, the bladder neck was identified moving the Foley catheter back and forth. We then made a 6cm transverse cystotomy 3cm from the bladder neck. We identified the ureteral orifices and care was taken to exclude then from the dissection. We then placed 3 holding stitches in the anterior bladder wall and secured it to Coopers ligament.  We then made a circumscribing incision around the base of the prostate. We then used a 0 vicryl in a figure of eight fashion in the base of the adenoma for traction. We proceeded with a posterior dissection of the prostate until we identified the capsule. We then used a combination of electrocautery, blunt and sharp dissection to free the adenoma from the capsule. We then dissected laterally to the apex. Individual bleeders were cauterized. We then dissected anteriorly along the capsule until we  reached the apex. The adenoma was then freed and placed in an endocatch bag.  We noted good hemostasis and no additional sutures were placed. A Foley catheter was then placed per urethra easily. We then tacked down the bladder neck to the prostatic fossa with a single interrupted 2-0 vicryl. We then proceeded to closed the cystotomy. We closed the bladder with a running 0 vicryl full thickness. We then performed a imbricating second layer  Closure with 0 vicryl. The bladder was then filled with 150cc of water and we noted no leak.  All sponge and needle counts were correct. A closed suction drain was brought to the previous left lateral robotic port site into the area of the peritoneal cavity. The previous right 12-mm assistant port was closed at the level of the fascia using a Carter-Thomason suture passer under laparoscopic vision. Robot was undocked. Specimen was retrieved by extending the previous camera port site inferiorly for distance approximately 3 cm and removing the prostatectomy specimens and setting aside for permanent pathology. The site was closed at the level of fascia using figure-of-eight 0 vicryl followed by reapproximation of the Scarpa's using running Vicryl. All incision sites were infiltrated with dilute Lyophilized Marcaine and closed at the level of the skin using subcuticular Monocryl followed by Dermabond. Procedure was then terminated. The patient tolerated the procedure well. There were no immediate periprocedural complications and the patient was taken to the postanesthesia care unit in stable Condition.  COMPLICATIONS: None  CONDITION: Stable, extubated, transferred to PACU  PLAN: The patient will be admitted for 1-2 for hydration, post operative monitoring and pain control. He will be discharged home with foley in place and foley will be removed in 14 days, He will have a cystogram prior to foley catheter removal

## 2017-09-19 NOTE — H&P (Signed)
Urology Admission H&P  Chief Complaint: gross hematuria  History of Present Illness: Jonathon Taylor is a 72yo with a hx of BPH who has failed medical therapy. He has been having intermittent gross hematuria with activity. He has severe LUTS on medical therapy  Past Medical History:  Diagnosis Date  . Anemia    ON IRON FOR PRIOR ANEMIA R/T BLEEDING BEFORE TURBT   . GERD (gastroesophageal reflux disease)   . H/O cardiac catheterization 2004   negative;Dr Tollie Eth  . Personal history of colonic polyps 08/12/2015  . Prostate enlargement   . Seasonal allergies   . Skin cancer    Dr [name]   Past Surgical History:  Procedure Laterality Date  . arthroscopic knee surgery Left 1995  . BASAL CELL CARCINOMA EXCISION     2000-2017  . CARDIAC CATHETERIZATION  02/27/2003   per patient " i had spot on symptoms of a heart attack and a specialist, dr Tollie Eth ,  was brought in and after i had  the cath ; they said it was bad reflux . " per patient , after changing diet , no reccurrence of symptoms   . COLONOSCOPY    . INGUINAL HERNIA REPAIR Bilateral 03/12/2017   Procedure: LAPAROSCOPIC BILATERAL INGUINAL HERNIA WITH  MESH;  Surgeon: Clovis Riley, MD;  Location: [facility];  Service: General;  Laterality: Bilateral;  . INSERTION OF MESH N/A 03/12/2017   Procedure: INSERTION OF MESH;  Surgeon: Clovis Riley, MD;  Location: [facility];  Service: General;  Laterality: N/A;  . TRANSURETHRAL RESECTION OF BLADDER TUMOR N/A 07/08/2017   Procedure: CYSTO CLOT EVACUATION AND FULGRATION;  Surgeon: Irine Seal, MD;  Location: [facility];  Service: Urology;  Laterality: N/A;    Home Medications:  Current Facility-Administered Medications  Medication Dose Route Frequency Provider Last Rate Last Dose  . bupivacaine liposome (EXPAREL) 1.3 % injection 266 mg  20 mL Infiltration Once Cleon Gustin, MD      . ceFAZolin (ANCEF) IVPB 2g/100 mL premix  2 g Intravenous 30 min Pre-Op Alyson Ingles  Candee Furbish, MD      . lactated ringers infusion   Intravenous Continuous Lyn Hollingshead, MD 50 mL/hr at 09/19/17 [phone]    . magnesium citrate solution 1 Bottle  1 Bottle Oral Once Keiden Deskin, Candee Furbish, MD       Allergies: No Known Allergies  Family History  Problem Relation Age of Onset  . Heart disease Mother   . Heart disease Maternal Grandfather   . Heart disease Father   . Heart disease Sister   . Colon cancer Neg Hx    Social History:  reports that he has never smoked. He has quit using smokeless tobacco. He reports that he drinks alcohol. He reports that he does not use drugs.  Review of Systems  Genitourinary: Positive for frequency, hematuria and urgency.  All other systems reviewed and are negative.   Physical Exam:  Vital signs in last 24 hours: Temp:  [98.2 F (36.8 C)] 98.2 F (36.8 C) (06/27 3007) Pulse Rate:  [61] 61 (06/27 0613) Resp:  [18] 18 (06/27 0613) BP: (110)/(73) 110/73 (06/27 0613) SpO2:  [98 %] 98 % (06/27 0613) Weight:  [74.8 kg (165 lb)] 74.8 kg (165 lb) (06/27 6226) Physical Exam  Constitutional: He is oriented to person, place, and time. He appears well-developed and well-nourished.  HENT:  Head: Normocephalic and atraumatic.  Eyes: Pupils are equal, round, and reactive to light. EOM are normal.  Neck: Normal range of motion. No thyromegaly present.  Cardiovascular: Normal rate and regular rhythm.  Respiratory: Effort normal. No respiratory distress.  GI: Soft. He exhibits no distension.  Musculoskeletal: Normal range of motion. He exhibits no edema.  Neurological: He is alert and oriented to person, place, and time.  Skin: Skin is warm and dry.  Psychiatric: He has a normal mood and affect. His behavior is normal. Judgment and thought content normal.    Laboratory Data:  Results for orders placed or performed during the hospital encounter of 09/19/17 (from the past 24 hour(s))  Type and screen [place]     Status:  None   Collection Time: 09/19/17  6:36 AM  Result Value Ref Range   ABO/RH(D) A NEG    Antibody Screen NEG    Sample Expiration      09/22/2017 Performed at [facility], [place] [address] [place], [place] [address]    No results found for this or any previous visit (from the past 240 hour(s)). Creatinine: No results for input(s): CREATININE in the last 168 hours. Baseline Creatinine: unknown  Impression/Assessment:  71yo with BPH, urinary retention and gross heamturia  Plan:  The risks/benefits/alternatives to robotic simple prostatectomy was explained to the patient and he understands and wishes to proceed with surgery  Nicolette Bang 09/19/2017, 7:33 AM

## 2017-09-19 NOTE — Transfer of Care (Signed)
Immediate Anesthesia Transfer of Care Note  Patient: Jonathon Taylor  Procedure(s) Performed: XI ROBOTIC ASSISTED SIMPLE PROSTATECTOMY (N/A Prostate)  Patient Location: PACU  Anesthesia Type:General  Level of Consciousness: awake, alert  and oriented  Airway & Oxygen Therapy: Patient Spontanous Breathing and Patient connected to face mask oxygen  Post-op Assessment: Report given to RN and Post -op Vital signs reviewed and stable  Post vital signs: Reviewed and stable  Last Vitals:  Vitals Value Taken Time  BP 118/70 09/19/2017 10:54 AM  Temp    Pulse 71 09/19/2017 10:58 AM  Resp 14 09/19/2017 10:58 AM  SpO2 100 % 09/19/2017 10:58 AM  Vitals shown include unvalidated device data.  Last Pain:  Vitals:   09/19/17 0626  TempSrc:   PainSc: 0-No pain         Complications: No apparent anesthesia complications

## 2017-09-20 ENCOUNTER — Encounter (HOSPITAL_COMMUNITY): Payer: Self-pay | Admitting: Urology

## 2017-09-20 LAB — BASIC METABOLIC PANEL
Anion gap: 7 (ref 5–15)
BUN: 11 mg/dL (ref 8–23)
CHLORIDE: 109 mmol/L (ref 98–111)
CO2: 25 mmol/L (ref 22–32)
Calcium: 8.4 mg/dL — ABNORMAL LOW (ref 8.9–10.3)
Creatinine, Ser: 0.9 mg/dL (ref 0.61–1.24)
GFR calc non Af Amer: >60 mL/min (ref >60)
Glucose, Bld: 129 mg/dL — ABNORMAL HIGH (ref 70–99)
POTASSIUM: 4.2 mmol/L (ref 3.5–5.1)
SODIUM: 141 mmol/L (ref 135–145)

## 2017-09-20 LAB — HEMOGLOBIN AND HEMATOCRIT, BLOOD
HCT: 38.3 % — ABNORMAL LOW (ref 39.0–52.0)
HEMOGLOBIN: 11.8 g/dL — AB (ref 13.0–17.0)

## 2017-09-20 MED ORDER — DOCUSATE SODIUM 100 MG PO CAPS
100.0000 mg | ORAL_CAPSULE | Freq: Every day | ORAL | Status: DC
  Administered 2017-09-20 – 2017-09-21 (×2): 100 mg via ORAL
  Filled 2017-09-20 (×2): qty 1

## 2017-09-20 MED ORDER — SENNA 8.6 MG PO TABS
1.0000 | ORAL_TABLET | Freq: Every day | ORAL | Status: DC
  Administered 2017-09-20 – 2017-09-21 (×2): 8.6 mg via ORAL
  Filled 2017-09-20 (×2): qty 1

## 2017-09-20 NOTE — Anesthesia Postprocedure Evaluation (Signed)
Anesthesia Post Note  Patient: Jonathon Taylor  Procedure(s) Performed: XI ROBOTIC ASSISTED SIMPLE PROSTATECTOMY (N/A Prostate)     Patient location during evaluation: PACU Anesthesia Type: General Level of consciousness: awake and alert Pain management: pain level controlled Vital Signs Assessment: post-procedure vital signs reviewed and stable Respiratory status: spontaneous breathing, nonlabored ventilation, respiratory function stable and patient connected to nasal cannula oxygen Cardiovascular status: blood pressure returned to baseline and stable Postop Assessment: no apparent nausea or vomiting Anesthetic complications: no    Last Vitals:  Vitals:   09/20/17 0616 09/20/17 0659  BP: (!) 160/84 (!) 144/83  Pulse: 64 67  Resp:  18  Temp: 36.9 C 37.2 C  SpO2: 100% 98%    Last Pain:  Vitals:   09/20/17 0857  TempSrc:   PainSc: 3                  Barnet Glasgow

## 2017-09-20 NOTE — Progress Notes (Signed)
Post-op note  Subjective: NAEON. The patient is doing well overall. Ambulating. He does complain of some bloating. Requesting stool softeners.    Objective: Vital signs in last 24 hours: Temp:  [97.7 F (36.5 C)-99.2 F (37.3 C)] 98.9 F (37.2 C) (06/28 0659) Pulse Rate:  [64-81] 67 (06/28 0659) Resp:  [10-22] 18 (06/28 0659) BP: (113-160)/(63-93) 144/83 (06/28 0659) SpO2:  [96 %-100 %] 98 % (06/28 0659)  Intake/Output from previous day: 06/27 0701 - 06/28 0700 In: 11550.3 [P.O.:480; I.V.:3670.3; IV Piggyback:1100] Out: 01751 [Urine:10575; Drains:10; Blood:400] Intake/Output this shift: No intake/output data recorded.  Physical Exam:  General: Alert and oriented. Abdomen: Soft, Nondistended, incisions c/d/i Incisions: Clean and dry. GU: foley catheter to drainage, draining light pink urine on very slow drip CBI  Lab Results: Recent Labs    09/19/17 1120 09/20/17 0401  HGB 12.3* 11.8*  HCT 39.0 38.3*    Assessment/Plan: POD#1  POD# 1 s/p robotic prostatectomy.  - SL IVF - Ambulate, Incentive spirometry - Transition to oral pain medication - Clamp CBI - Plan for likely discharge later today, remove JP drain prior - Patient will discharge with Foley in place and return to clinic for follow up with cystogram prior   Tashanti Dalporto Rob Bunting, MD   LOS: 1 day   Kelty Szafran Rob Bunting 09/20/2017, 7:39 AM

## 2017-09-21 NOTE — Progress Notes (Signed)
Pt was discharged home today. Instructions were reviewed with patient, and questions were answered. Pt was taken to main entrance via wheelchair by NT.  

## 2017-09-21 NOTE — Discharge Summary (Signed)
Patient ID: Jonathon Taylor MRN: [id number] DOB/AGE: 07/15/1945 72 y.o.  Admit date: 09/19/2017 Discharge date: 09/21/2017  Primary Care Physician:  Gregor Hams, MD  Discharge Diagnoses: BPH with obstruction Present on Admission: . BPH with obstruction/lower urinary tract symptoms   Consults:  None     Discharge Medications: Allergies as of 09/21/2017   No Known Allergies     Medication List    STOP taking these medications   OVER THE COUNTER MEDICATION   tamsulosin 0.4 MG Caps capsule Commonly known as:  FLOMAX     TAKE these medications   ADVAIR DISKUS 100-50 MCG/DOSE Aepb Generic drug:  Fluticasone-Salmeterol INHALE 1 PUFF INTO THE LUNGS 2 TIMES DAILY What changed:  See the new instructions.   albuterol 108 (90 Base) MCG/ACT inhaler Commonly known as:  PROVENTIL HFA;VENTOLIN HFA Inhale 2 puffs into the lungs every 6 (six) hours as needed for wheezing.   diclofenac sodium 1 % Gel Commonly known as:  VOLTAREN Apply 1 application topically daily.   finasteride 5 MG tablet Commonly known as:  PROSCAR Take 1 tablet (5 mg total) by mouth daily.   fluticasone 50 MCG/ACT nasal spray Commonly known as:  FLONASE Place 2 sprays into both nostrils daily. What changed:    when to take this  reasons to take this   HYDROcodone-acetaminophen 5-325 MG tablet Commonly known as:  NORCO Take 1-2 tablets by mouth every 6 (six) hours as needed.   sulfamethoxazole-trimethoprim 800-160 MG tablet Commonly known as:  BACTRIM DS,SEPTRA DS Take 1 tablet by mouth 2 (two) times daily. Start the day prior to foley removal appointment   triazolam 0.25 MG tablet Commonly known as:  HALCION TAKE 1/2 TABLET BY MOUTH AT BEDTIME AS NEEDED FOR SLEEP What changed:    how much to take  how to take this  when to take this  reasons to take this  additional instructions        Significant Diagnostic Studies:  No results found.  Brief H and P: For complete details  please refer to admission H and P, but in brief the patient is admitted for surgical management of BPH with obstruction.  Hospital Course: The patient had no significant difficulty following his suprapubic prostatectomy.  He had a benign postoperative course and was advanced to a regular diet.  By postoperative day #2, he had reached maximal hospital benefit.  Urine was clearing, drain was removed on postoperative day #1. Active Problems:   BPH with obstruction/lower urinary tract symptoms   Day of Discharge BP (!) 160/99 (BP Location: Left Arm)   Pulse 62   Temp 99 F (37.2 C) (Oral)   Resp 16   Ht 5' 8.5" (1.74 m)   Wt 74.8 kg (165 lb)   SpO2 98%   BMI 24.72 kg/m   No results found for this or any previous visit (from the past 24 hour(s)).  Physical Exam: General: Alert and awake oriented x3 not in any acute distress. HEENT: anicteric sclera, pupils reactive to light and accommodation CVS: S1-S2 clear no murmur rubs or gallops Chest: clear to auscultation bilaterally, no wheezing rales or rhonchi Abdomen: soft nontender, nondistended, normal bowel sounds, no organomegaly Extremities: no cyanosis, clubbing or edema noted bilaterally Neuro: Cranial nerves II-XII intact, no focal neurological deficits  Disposition: Home  Diet: Regular  Activity: Discussed with patient   Disposition and Follow-up:  Follow-up appointment scheduled    DISCHARGE FOLLOW-UP Follow-up Information    McKenzie, Candee Furbish, MD On  10/03/2017.   Specialty:  Urology Why:  at 1:30 Contact information: [address] [place] [address] [phone]           Time spent on Discharge: 15 minutes  Signed: Lillette Boxer Ryane Canavan 09/21/2017, 10:32 AM

## 2017-09-22 ENCOUNTER — Encounter: Payer: Self-pay | Admitting: Family Medicine

## 2017-10-03 DIAGNOSIS — R350 Frequency of micturition: Secondary | ICD-10-CM | POA: Diagnosis not present

## 2017-10-03 DIAGNOSIS — N401 Enlarged prostate with lower urinary tract symptoms: Secondary | ICD-10-CM | POA: Diagnosis not present

## 2017-10-31 DIAGNOSIS — R3914 Feeling of incomplete bladder emptying: Secondary | ICD-10-CM | POA: Diagnosis not present

## 2017-10-31 DIAGNOSIS — R300 Dysuria: Secondary | ICD-10-CM | POA: Diagnosis not present

## 2017-10-31 DIAGNOSIS — N401 Enlarged prostate with lower urinary tract symptoms: Secondary | ICD-10-CM | POA: Diagnosis not present

## 2017-10-31 DIAGNOSIS — R8271 Bacteriuria: Secondary | ICD-10-CM | POA: Diagnosis not present

## 2018-01-02 DIAGNOSIS — R310 Gross hematuria: Secondary | ICD-10-CM | POA: Diagnosis not present

## 2018-01-02 DIAGNOSIS — R3914 Feeling of incomplete bladder emptying: Secondary | ICD-10-CM | POA: Diagnosis not present

## 2018-01-02 DIAGNOSIS — N401 Enlarged prostate with lower urinary tract symptoms: Secondary | ICD-10-CM | POA: Diagnosis not present

## 2018-01-27 ENCOUNTER — Encounter: Payer: Self-pay | Admitting: Family Medicine

## 2018-01-27 NOTE — Progress Notes (Signed)
Flu letter sent

## 2018-01-28 ENCOUNTER — Encounter: Payer: Self-pay | Admitting: Family Medicine

## 2018-01-28 MED ORDER — ZOSTER VAC RECOMB ADJUVANTED 50 MCG/0.5ML IM SUSR: 0.5000 mL | Freq: Once | INTRAMUSCULAR | 1 refills | Status: AC

## 2018-02-26 ENCOUNTER — Other Ambulatory Visit: Payer: Self-pay | Admitting: Dermatology

## 2018-02-26 DIAGNOSIS — L570 Actinic keratosis: Secondary | ICD-10-CM | POA: Diagnosis not present

## 2018-02-26 DIAGNOSIS — D0462 Carcinoma in situ of skin of left upper limb, including shoulder: Secondary | ICD-10-CM | POA: Diagnosis not present

## 2018-02-26 DIAGNOSIS — D485 Neoplasm of uncertain behavior of skin: Secondary | ICD-10-CM | POA: Diagnosis not present

## 2018-02-26 DIAGNOSIS — C44612 Basal cell carcinoma of skin of right upper limb, including shoulder: Secondary | ICD-10-CM | POA: Diagnosis not present

## 2018-03-06 DIAGNOSIS — H5203 Hypermetropia, bilateral: Secondary | ICD-10-CM | POA: Diagnosis not present

## 2018-03-25 DIAGNOSIS — D0462 Carcinoma in situ of skin of left upper limb, including shoulder: Secondary | ICD-10-CM | POA: Diagnosis not present

## 2018-03-25 DIAGNOSIS — C44612 Basal cell carcinoma of skin of right upper limb, including shoulder: Secondary | ICD-10-CM | POA: Diagnosis not present

## 2018-03-25 DIAGNOSIS — L570 Actinic keratosis: Secondary | ICD-10-CM | POA: Diagnosis not present

## 2018-04-11 ENCOUNTER — Other Ambulatory Visit: Payer: Self-pay | Admitting: Family Medicine

## 2018-04-11 ENCOUNTER — Telehealth: Payer: Self-pay | Admitting: Family Medicine

## 2018-04-11 DIAGNOSIS — R972 Elevated prostate specific antigen [PSA]: Secondary | ICD-10-CM

## 2018-04-11 DIAGNOSIS — R739 Hyperglycemia, unspecified: Secondary | ICD-10-CM

## 2018-04-11 DIAGNOSIS — D62 Acute posthemorrhagic anemia: Secondary | ICD-10-CM

## 2018-04-11 DIAGNOSIS — N401 Enlarged prostate with lower urinary tract symptoms: Secondary | ICD-10-CM

## 2018-04-11 DIAGNOSIS — G4700 Insomnia, unspecified: Secondary | ICD-10-CM

## 2018-04-11 DIAGNOSIS — D500 Iron deficiency anemia secondary to blood loss (chronic): Secondary | ICD-10-CM

## 2018-04-11 DIAGNOSIS — J4520 Mild intermittent asthma, uncomplicated: Secondary | ICD-10-CM

## 2018-04-11 DIAGNOSIS — R350 Frequency of micturition: Secondary | ICD-10-CM

## 2018-04-11 NOTE — Telephone Encounter (Signed)
Received fax from [employer] that Triazolam requires a PA. Information has been sent to the insurance company. Awaiting determination.

## 2018-04-16 NOTE — Telephone Encounter (Signed)
Received a message from insurance that Triazolam was denied for medical necessity and does not give the option for a PA. Please advise.

## 2018-04-18 DIAGNOSIS — R739 Hyperglycemia, unspecified: Secondary | ICD-10-CM | POA: Insufficient documentation

## 2018-04-18 NOTE — Addendum Note (Signed)
Addended by: Gregor Hams on: 04/18/2018 03:56 PM   Modules accepted: Orders

## 2018-04-18 NOTE — Telephone Encounter (Signed)
Pt advised. He has been using GoodRx for this Rx. He will continue to do this. No further questions.

## 2018-04-18 NOTE — Telephone Encounter (Signed)
Pt advised. Will get fasting labs done

## 2018-04-18 NOTE — Telephone Encounter (Signed)
Labs ordered.  Labs should be done fasting anytime prior to the visit.  Labs should be done however at least 1 day before the visit

## 2018-04-18 NOTE — Telephone Encounter (Signed)
Pt also got Shingrix at [facility], chart updated.

## 2018-04-18 NOTE — Telephone Encounter (Signed)
Patient will likely have to pay out of pocket or switch medicines. We did the same thing last year I believe

## 2018-04-18 NOTE — Telephone Encounter (Signed)
Pt has upcoming appt on 05/02/18. Questions if PCP wants blood work done prior to appt. Routing.

## 2018-04-25 DIAGNOSIS — R350 Frequency of micturition: Secondary | ICD-10-CM | POA: Diagnosis not present

## 2018-04-25 DIAGNOSIS — D62 Acute posthemorrhagic anemia: Secondary | ICD-10-CM | POA: Diagnosis not present

## 2018-04-25 DIAGNOSIS — R972 Elevated prostate specific antigen [PSA]: Secondary | ICD-10-CM | POA: Diagnosis not present

## 2018-04-25 DIAGNOSIS — E7800 Pure hypercholesterolemia, unspecified: Secondary | ICD-10-CM | POA: Diagnosis not present

## 2018-04-25 DIAGNOSIS — D500 Iron deficiency anemia secondary to blood loss (chronic): Secondary | ICD-10-CM | POA: Diagnosis not present

## 2018-04-26 LAB — COMPLETE METABOLIC PANEL WITH GFR
AG Ratio: 1.7 (calc) (ref 1.0–2.5)
ALKALINE PHOSPHATASE (APISO): 42 U/L (ref 40–115)
ALT: 12 U/L (ref 9–46)
AST: 16 U/L (ref 10–35)
Albumin: 4 g/dL (ref 3.6–5.1)
BUN: 15 mg/dL (ref 7–25)
CHLORIDE: 105 mmol/L (ref 98–110)
CO2: 27 mmol/L (ref 20–32)
CREATININE: 0.92 mg/dL (ref 0.70–1.18)
Calcium: 9.3 mg/dL (ref 8.6–10.3)
GFR, Est African American: 97 mL/min/{1.73_m2} (ref >=60)
GFR, Est Non African American: 83 mL/min/{1.73_m2} (ref >=60)
GLUCOSE: 94 mg/dL (ref 65–99)
Globulin: 2.4 g/dL (calc) (ref 1.9–3.7)
Potassium: 4.4 mmol/L (ref 3.5–5.3)
Sodium: 140 mmol/L (ref 135–146)
Total Bilirubin: 0.7 mg/dL (ref 0.2–1.2)
Total Protein: 6.4 g/dL (ref 6.1–8.1)

## 2018-04-26 LAB — CBC WITH DIFFERENTIAL/PLATELET
Absolute Monocytes: 619 cells/uL (ref 200–950)
BASOS ABS: 30 {cells}/uL (ref 0–200)
Basophils Relative: 0.7 %
EOS PCT: 1.9 %
Eosinophils Absolute: 82 cells/uL (ref 15–500)
HCT: 42.6 % (ref 38.5–50.0)
Hemoglobin: 14 g/dL (ref 13.2–17.1)
Lymphs Abs: 2116 cells/uL (ref 850–3900)
MCH: 27.3 pg (ref 27.0–33.0)
MCHC: 32.9 g/dL (ref 32.0–36.0)
MCV: 83.2 fL (ref 80.0–100.0)
MONOS PCT: 14.4 %
MPV: 10.4 fL (ref 7.5–12.5)
NEUTROS ABS: 1453 {cells}/uL — AB (ref 1500–7800)
Neutrophils Relative %: 33.8 %
PLATELETS: 281 10*3/uL (ref 140–400)
RBC: 5.12 10*6/uL (ref 4.20–5.80)
RDW: 13.3 % (ref 11.0–15.0)
TOTAL LYMPHOCYTE: 49.2 %
WBC: 4.3 10*3/uL (ref 3.8–10.8)

## 2018-04-26 LAB — LIPID PANEL W/REFLEX DIRECT LDL
Cholesterol: 206 mg/dL — ABNORMAL HIGH (ref <200)
HDL: 93 mg/dL (ref >40)
LDL CHOLESTEROL (CALC): 100 mg/dL — AB
Non-HDL Cholesterol (Calc): 113 mg/dL (calc) (ref <130)
TRIGLYCERIDES: 45 mg/dL (ref <150)
Total CHOL/HDL Ratio: 2.2 (calc) (ref <5.0)

## 2018-04-26 LAB — IRON,TIBC AND FERRITIN PANEL
%SAT: 40 % (ref 20–48)
Ferritin: 36 ng/mL (ref 24–380)
Iron: 132 ug/dL (ref 50–180)
TIBC: 328 mcg/dL (calc) (ref 250–425)

## 2018-04-26 LAB — PSA: PSA: 0.2 ng/mL (ref <=4.0)

## 2018-04-26 LAB — HEMOGLOBIN A1C
HEMOGLOBIN A1C: 5.6 %{Hb} (ref <5.7)
MEAN PLASMA GLUCOSE: 114 (calc)
eAG (mmol/L): 6.3 (calc)

## 2018-05-02 ENCOUNTER — Encounter: Payer: BLUE CROSS/BLUE SHIELD | Admitting: Family Medicine

## 2018-05-09 ENCOUNTER — Ambulatory Visit (INDEPENDENT_AMBULATORY_CARE_PROVIDER_SITE_OTHER): Payer: BLUE CROSS/BLUE SHIELD | Admitting: Family Medicine

## 2018-05-09 ENCOUNTER — Encounter: Payer: Self-pay | Admitting: Family Medicine

## 2018-05-09 VITALS — BP 134/71 | HR 63 | Ht 68.0 in | Wt 173.0 lb

## 2018-05-09 DIAGNOSIS — J4520 Mild intermittent asthma, uncomplicated: Secondary | ICD-10-CM | POA: Diagnosis not present

## 2018-05-09 DIAGNOSIS — G4700 Insomnia, unspecified: Secondary | ICD-10-CM

## 2018-05-09 DIAGNOSIS — Z0000 Encounter for general adult medical examination without abnormal findings: Secondary | ICD-10-CM

## 2018-05-09 DIAGNOSIS — N401 Enlarged prostate with lower urinary tract symptoms: Secondary | ICD-10-CM

## 2018-05-09 DIAGNOSIS — Z6826 Body mass index (BMI) 26.0-26.9, adult: Secondary | ICD-10-CM

## 2018-05-09 DIAGNOSIS — R350 Frequency of micturition: Secondary | ICD-10-CM

## 2018-05-09 MED ORDER — FLUTICASONE PROPIONATE 50 MCG/ACT NA SUSP: 2.0000 | Freq: Every day | NASAL | 12 refills | Status: DC | PRN

## 2018-05-09 MED ORDER — TRIAZOLAM 0.25 MG PO TABS: 0.2500 mg | ORAL_TABLET | Freq: Every evening | ORAL | 5 refills | Status: DC | PRN

## 2018-05-09 MED ORDER — SPACER/AERO-HOLDING CHAMBERS DEVI: 1.0000 | Freq: Two times a day (BID) | 0 refills | Status: DC

## 2018-05-09 MED ORDER — BUDESONIDE-FORMOTEROL FUMARATE 160-4.5 MCG/ACT IN AERO: 2.0000 | INHALATION_SPRAY | Freq: Two times a day (BID) | RESPIRATORY_TRACT | 4 refills | Status: DC

## 2018-05-09 MED ORDER — ALBUTEROL SULFATE HFA 108 (90 BASE) MCG/ACT IN AERS: 2.0000 | INHALATION_SPRAY | Freq: Four times a day (QID) | RESPIRATORY_TRACT | 1 refills | Status: DC | PRN

## 2018-05-09 NOTE — Patient Instructions (Addendum)
Thank you for coming in today.  Work on Hotel manager.   Thank you for coming in today. I think you have a little tendonitis in the heel.  Do the heel lifts about 30 reps 2-3x daily.  Remember to go slowly.   Recheck in about 6 months or sooner if needed.

## 2018-05-09 NOTE — Progress Notes (Signed)
KASTIN CERDA is a 73 y.o. male who presents to [facility] today for well adult visit.  Absalom is doing reasonably well.  He is exercising regularly and trying diet.  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  He is very happy with his job.  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  He notes this has improved significantly and is feeling much better.  He notes that he has a history of asthma and uses Advair twice daily typically episodically.  He notes it takes a while to work and is wonder if there is anything better.  He is happy with his control and notes that it is working pretty well to control his asthma.  He denies exacerbations or wheezing or significant cough.  Additionally he notes a little bit of pain in the left posterior heel.  This is been going on for a few weeks without injury.  He is interested in some home exercises that he could try to make it better.  Symptoms are quite mild.  Additionally he notes a slight amount of unsteadiness following his surgery.  He is interested in some home exercises for balance to reduce his chances of falls.  He denies any significant falls.  He continues to be bothered by insomnia.  He uses a tiny amount of triazolam at night occasionally.  He is tried alternatives and found them to be insufficient.  He is happy to keep doing what he is doing.    ROS as above:  Past Medical History:  Diagnosis Date  . Anemia    ON IRON FOR PRIOR ANEMIA R/T BLEEDING BEFORE TURBT   . GERD (gastroesophageal reflux disease)   . H/O cardiac catheterization 2004   negative;Dr Tollie Eth  . Personal history of colonic polyps 08/12/2015  . Prostate enlargement   . Seasonal allergies   . Skin cancer    Dr [name]   Past Surgical History:    Procedure Laterality Date  . arthroscopic knee surgery Left 1995  . BASAL CELL CARCINOMA EXCISION     2000-2017  . CARDIAC CATHETERIZATION  02/27/2003   per patient " i had spot on symptoms of a heart attack and a specialist, dr Tollie Eth ,  was brought in and after i had  the cath ; they said it was bad reflux . " per patient , after changing diet , no reccurrence of symptoms   . COLONOSCOPY    . INGUINAL HERNIA REPAIR Bilateral 03/12/2017   Procedure: LAPAROSCOPIC BILATERAL INGUINAL HERNIA WITH  MESH;  Surgeon: Clovis Riley, MD;  Location: [facility];  Service: General;  Laterality: Bilateral;  . INSERTION OF MESH N/A 03/12/2017   Procedure: INSERTION OF MESH;  Surgeon: Clovis Riley, MD;  Location: [facility];  Service: General;  Laterality: N/A;  . TRANSURETHRAL RESECTION OF BLADDER TUMOR N/A 07/08/2017   Procedure: CYSTO CLOT EVACUATION AND FULGRATION;  Surgeon: Irine Seal, MD;  Location: [facility];  Service: Urology;  Laterality: N/A;  . XI ROBOTIC ASSISTED SIMPLE PROSTATECTOMY N/A 09/19/2017   Procedure: XI ROBOTIC ASSISTED SIMPLE PROSTATECTOMY;  Surgeon: Cleon Gustin, MD;  Location: [facility];  Service: Urology;  Laterality: N/A;   Social History   Tobacco Use  . Smoking status: Never Smoker  . Smokeless tobacco: Former Network engineer Use Topics  .  Alcohol use: Yes    Alcohol/week: 0.0 standard drinks    Comment: 2 glasses of red wine nightly   family history includes Heart disease in his father, maternal grandfather, mother, and sister.  Medications: Current Outpatient Medications  Medication Sig Dispense Refill  . albuterol (PROVENTIL HFA;VENTOLIN HFA) 108 (90 Base) MCG/ACT inhaler Inhale 2 puffs into the lungs every 6 (six) hours as needed for wheezing. 1 Inhaler 1  . diclofenac sodium (VOLTAREN) 1 % GEL Apply 1 application topically daily.    . fluticasone (FLONASE) 50 MCG/ACT nasal spray Place 2 sprays into both nostrils daily as needed for allergies or  rhinitis. 16 g 12  . triazolam (HALCION) 0.25 MG tablet Take 1 tablet (0.25 mg total) by mouth at bedtime as needed for sleep. 30 tablet 5  . budesonide-formoterol (SYMBICORT) 160-4.5 MCG/ACT inhaler Inhale 2 puffs into the lungs 2 (two) times daily. 3 Inhaler 4  . Spacer/Aero-Holding Chambers DEVI 1 Device by Does not apply route 2 (two) times daily. 1 each 0   No current facility-administered medications for this visit.    No Known Allergies  Health Maintenance Health Maintenance  Topic Date Due  . COLONOSCOPY  08/08/2018  . TETANUS/TDAP  05/02/2026  . INFLUENZA VACCINE  Completed  . Hepatitis C Screening  Completed  . PNA vac Low Risk Adult  Completed     Exam:  BP 134/71   Pulse 63   Ht 5\' 8"  (1.727 m)   Wt 173 lb (78.5 kg)   BMI 26.30 kg/m  Wt Readings from Last 5 Encounters:  05/09/18 173 lb (78.5 kg)  09/19/17 165 lb (74.8 kg)  09/16/17 165 lb (74.8 kg)  07/31/17 163 lb (73.9 kg)  07/08/17 165 lb (74.8 kg)      Gen: Well NAD HEENT: EOMI,  MMM Lungs: Normal work of breathing. CTABL Heart: RRR no MRG Abd: NABS, Soft. Nondistended, Nontender Exts: Brisk capillary refill, warm and well perfused.  Psych: Alert and oriented normal speech thought process and affect Left calcaneus normal-appearing normal motion minimal tenderness at the tendinous insertion of the Achilles tendon.  Intact strength.  Normal gait. Neuro: Alert and oriented normal coordination balance and gait.  Depression screen [facility] 2/9 05/09/2018 05/02/2016 04/27/2015  Decreased Interest 0 0 0  Down, Depressed, Hopeless 0 0 0  PHQ - 2 Score 0 0 0  Altered sleeping 0 - -  Tired, decreased energy 0 - -  Change in appetite 0 - -  Feeling bad or failure about yourself  0 - -  Trouble concentrating 0 - -  Moving slowly or fidgety/restless 0 - -  Suicidal thoughts 0 - -  PHQ-9 Score 0 - -  Difficult doing work/chores Not difficult at all - -       Lab and Radiology Results Recent Results (from the  past 2160 hour(s))  CBC with Differential/Platelet     Status: Abnormal   Collection Time: 04/25/18 12:00 AM  Result Value Ref Range   WBC 4.3 3.8 - 10.8 Thousand/uL   RBC 5.12 4.20 - 5.80 Million/uL   Hemoglobin 14.0 13.2 - 17.1 g/dL   HCT 42.6 38.5 - 50.0 %   MCV 83.2 80.0 - 100.0 fL   MCH 27.3 27.0 - 33.0 pg   MCHC 32.9 32.0 - 36.0 g/dL   RDW 13.3 11.0 - 15.0 %   Platelets 281 140 - 400 Thousand/uL   MPV 10.4 7.5 - 12.5 fL   Neutro Abs 1,453 (L) 1,500 -  7,800 cells/uL   Lymphs Abs 2,116 850 - 3,900 cells/uL   Absolute Monocytes 619 200 - 950 cells/uL   Eosinophils Absolute 82 15 - 500 cells/uL   Basophils Absolute 30 0 - 200 cells/uL   Neutrophils Relative % 33.8 %   Total Lymphocyte 49.2 %   Monocytes Relative 14.4 %   Eosinophils Relative 1.9 %   Basophils Relative 0.7 %  COMPLETE METABOLIC PANEL WITH GFR     Status: None   Collection Time: 04/25/18 12:00 AM  Result Value Ref Range   Glucose, Bld 94 65 - 99 mg/dL    Comment: .            Fasting reference interval .    BUN 15 7 - 25 mg/dL   Creat 0.92 0.70 - 1.18 mg/dL    Comment: For patients >61 years of age, the reference limit for Creatinine is approximately 13% higher for people identified as African-American. .    GFR, Est Non African American 83 > OR = 60 mL/min/1.66m2   GFR, Est African American 97 > OR = 60 mL/min/1.44m2   BUN/Creatinine Ratio NOT APPLICABLE 6 - 22 (calc)   Sodium 140 135 - 146 mmol/L   Potassium 4.4 3.5 - 5.3 mmol/L   Chloride 105 98 - 110 mmol/L   CO2 27 20 - 32 mmol/L   Calcium 9.3 8.6 - 10.3 mg/dL   Total Protein 6.4 6.1 - 8.1 g/dL   Albumin 4.0 3.6 - 5.1 g/dL   Globulin 2.4 1.9 - 3.7 g/dL (calc)   AG Ratio 1.7 1.0 - 2.5 (calc)   Total Bilirubin 0.7 0.2 - 1.2 mg/dL   Alkaline phosphatase (APISO) 42 40 - 115 U/L   AST 16 10 - 35 U/L   ALT 12 9 - 46 U/L  Lipid Panel w/reflex Direct LDL     Status: Abnormal   Collection Time: 04/25/18 12:00 AM  Result Value Ref Range    Cholesterol 206 (H) <200 mg/dL   HDL 93 >40 mg/dL   Triglycerides 45 <150 mg/dL   LDL Cholesterol (Calc) 100 (H) mg/dL (calc)    Comment: Reference range: <100 . Desirable range <100 mg/dL for primary prevention;   <70 mg/dL for patients with CHD or diabetic patients  with > or = 2 CHD risk factors. Marland Kitchen LDL-C is now calculated using the Martin-Hopkins  calculation, which is a validated novel method providing  better accuracy than the Friedewald equation in the  estimation of LDL-C.  Cresenciano Genre et al. Annamaria Helling. 8416;606(30): 2061-2068  (http://education.[facility].com/faq/FAQ164)    Total CHOL/HDL Ratio 2.2 <5.0 (calc)   Non-HDL Cholesterol (Calc) 113 <130 mg/dL (calc)    Comment: For patients with diabetes plus 1 major ASCVD risk  factor, treating to a non-HDL-C goal of <100 mg/dL  (LDL-C of <70 mg/dL) is considered a therapeutic  option.   PSA     Status: None   Collection Time: 04/25/18 12:00 AM  Result Value Ref Range   PSA 0.2 < OR = 4.0 ng/mL    Comment: The total PSA value from this assay system is  standardized against the WHO standard. The test  result will be approximately 20% lower when compared  to the equimolar-standardized total PSA (Beckman  Coulter). Comparison of serial PSA results should be  interpreted with this fact in mind. . This test was performed using the Siemens  chemiluminescent method. Values obtained from  different assay methods cannot be used interchangeably. PSA levels, regardless of value,  should not be interpreted as absolute evidence of the presence or absence of disease.   Hemoglobin A1c     Status: None   Collection Time: 04/25/18 12:00 AM  Result Value Ref Range   Hgb A1c MFr Bld 5.6 <5.7 % of total Hgb    Comment: For the purpose of screening for the presence of diabetes: . <5.7%       Consistent with the absence of diabetes 5.7-6.4%    Consistent with increased risk for diabetes             (prediabetes) > or =6.5%  Consistent  with diabetes . This assay result is consistent with a decreased risk of diabetes. . Currently, no consensus exists regarding use of hemoglobin A1c for diagnosis of diabetes in children. . According to American Diabetes Association (ADA) guidelines, hemoglobin A1c <7.0% represents optimal control in non-pregnant diabetic patients. Different metrics may apply to specific patient populations.  Standards of Medical Care in Diabetes(ADA). .    Mean Plasma Glucose 114 (calc)   eAG (mmol/L) 6.3 (calc)  Iron, TIBC and Ferritin Panel     Status: None   Collection Time: 04/25/18 12:00 AM  Result Value Ref Range   Iron 132 50 - 180 mcg/dL   TIBC 328 250 - 425 mcg/dL (calc)   %SAT 40 20 - 48 % (calc)   Ferritin 36 24 - 380 ng/mL      Assessment and Plan: 73 y.o. male with well adult.  Doing very well.  Patient exercises regularly and eats a careful diet.  Asthma: Reasonably well-controlled with mild intermittent asthma.  Given his episodic nature of inhaled steroid with long-acting beta agonists I think Symbicort may be a better option.  Plan to switch to Symbicort with a spacer and recheck in about 6 months.  Discussed precautions.  Plan to continue triazolam for insomnia.  Discussed precautions with that.  Noted that this is not a safest option for insomnia however he is happy with how its going and has not had adverse events.    PDMP reviewed during this encounter. No orders of the defined types were placed in this encounter.  Meds ordered this encounter  Medications  . budesonide-formoterol (SYMBICORT) 160-4.5 MCG/ACT inhaler    Sig: Inhale 2 puffs into the lungs 2 (two) times daily.    Dispense:  3 Inhaler    Refill:  4    Replaces adviar  . Spacer/Aero-Holding Chambers DEVI    Sig: 1 Device by Does not apply route 2 (two) times daily.    Dispense:  1 each    Refill:  0  . albuterol (PROVENTIL HFA;VENTOLIN HFA) 108 (90 Base) MCG/ACT inhaler    Sig: Inhale 2 puffs into  the lungs every 6 (six) hours as needed for wheezing.    Dispense:  1 Inhaler    Refill:  1  . triazolam (HALCION) 0.25 MG tablet    Sig: Take 1 tablet (0.25 mg total) by mouth at bedtime as needed for sleep.    Dispense:  30 tablet    Refill:  5  . fluticasone (FLONASE) 50 MCG/ACT nasal spray    Sig: Place 2 sprays into both nostrils daily as needed for allergies or rhinitis.    Dispense:  16 g    Refill:  12     Discussed warning signs or symptoms. Please see discharge instructions. Patient expresses understanding.

## 2018-05-12 ENCOUNTER — Telehealth: Payer: Self-pay | Admitting: Family Medicine

## 2018-05-12 NOTE — Telephone Encounter (Signed)
Received fax from [employer] that Triazolam requires a PA. Information has been sent to the insurance company. Awaiting determination.

## 2018-05-26 NOTE — Telephone Encounter (Signed)
Per insurance Triazolam was denied and was not covered under patients insurance since it was excluded from plan.

## 2018-08-13 ENCOUNTER — Encounter: Payer: Self-pay | Admitting: Internal Medicine

## 2018-10-17 DIAGNOSIS — E7800 Pure hypercholesterolemia, unspecified: Secondary | ICD-10-CM | POA: Insufficient documentation

## 2018-11-21 ENCOUNTER — Other Ambulatory Visit: Payer: Self-pay

## 2018-11-21 ENCOUNTER — Ambulatory Visit (INDEPENDENT_AMBULATORY_CARE_PROVIDER_SITE_OTHER): Payer: BC Managed Care – PPO | Admitting: Family Medicine

## 2018-11-21 DIAGNOSIS — R310 Gross hematuria: Secondary | ICD-10-CM | POA: Diagnosis not present

## 2018-11-21 DIAGNOSIS — Z23 Encounter for immunization: Secondary | ICD-10-CM

## 2018-11-21 DIAGNOSIS — N401 Enlarged prostate with lower urinary tract symptoms: Secondary | ICD-10-CM | POA: Diagnosis not present

## 2018-11-21 DIAGNOSIS — R3914 Feeling of incomplete bladder emptying: Secondary | ICD-10-CM | POA: Diagnosis not present

## 2018-12-16 ENCOUNTER — Telehealth: Payer: Self-pay | Admitting: Family Medicine

## 2018-12-16 ENCOUNTER — Encounter: Payer: Self-pay | Admitting: Family Medicine

## 2018-12-16 DIAGNOSIS — D508 Other iron deficiency anemias: Secondary | ICD-10-CM

## 2018-12-16 DIAGNOSIS — R972 Elevated prostate specific antigen [PSA]: Secondary | ICD-10-CM

## 2018-12-16 DIAGNOSIS — Z0000 Encounter for general adult medical examination without abnormal findings: Secondary | ICD-10-CM

## 2018-12-16 DIAGNOSIS — E785 Hyperlipidemia, unspecified: Secondary | ICD-10-CM

## 2018-12-16 NOTE — Telephone Encounter (Signed)
Labs ordered, left VM for Pt with update

## 2018-12-16 NOTE — Telephone Encounter (Signed)
Patient called and rescheduled his February Physical with Dr.Alexander but would like for Dr.Corey to let him know what labs he needs to get completed before his physical appt. His wife sees Dr.Alexander already and states he will miss Dr.Corey

## 2018-12-16 NOTE — Telephone Encounter (Signed)
Would recommend CBC, CMP, PSA, lipid panel

## 2018-12-17 ENCOUNTER — Encounter: Payer: Self-pay | Admitting: Family Medicine

## 2018-12-17 MED ORDER — TRIAZOLAM 0.25 MG PO TABS: 0.2500 mg | ORAL_TABLET | Freq: Every evening | ORAL | 5 refills | Status: DC | PRN

## 2019-02-02 DIAGNOSIS — D229 Melanocytic nevi, unspecified: Secondary | ICD-10-CM | POA: Diagnosis not present

## 2019-02-02 DIAGNOSIS — L570 Actinic keratosis: Secondary | ICD-10-CM | POA: Diagnosis not present

## 2019-05-11 ENCOUNTER — Encounter: Payer: BC Managed Care – PPO | Admitting: Osteopathic Medicine

## 2019-05-11 ENCOUNTER — Encounter: Payer: BLUE CROSS/BLUE SHIELD | Admitting: Family Medicine

## 2019-05-15 ENCOUNTER — Encounter: Payer: Self-pay | Admitting: Family Medicine

## 2019-08-07 DIAGNOSIS — H5203 Hypermetropia, bilateral: Secondary | ICD-10-CM | POA: Diagnosis not present

## 2019-09-01 ENCOUNTER — Encounter: Payer: Self-pay | Admitting: Family Medicine

## 2019-09-01 ENCOUNTER — Ambulatory Visit (INDEPENDENT_AMBULATORY_CARE_PROVIDER_SITE_OTHER): Payer: BC Managed Care – PPO | Admitting: Family Medicine

## 2019-09-01 ENCOUNTER — Other Ambulatory Visit: Payer: Self-pay

## 2019-09-01 VITALS — BP 152/71 | HR 63 | Temp 98.1°F | Ht 68.9 in | Wt 170.9 lb

## 2019-09-01 DIAGNOSIS — R350 Frequency of micturition: Secondary | ICD-10-CM | POA: Diagnosis not present

## 2019-09-01 DIAGNOSIS — N401 Enlarged prostate with lower urinary tract symptoms: Secondary | ICD-10-CM

## 2019-09-01 DIAGNOSIS — Z0000 Encounter for general adult medical examination without abnormal findings: Secondary | ICD-10-CM | POA: Insufficient documentation

## 2019-09-01 DIAGNOSIS — E7800 Pure hypercholesterolemia, unspecified: Secondary | ICD-10-CM | POA: Diagnosis not present

## 2019-09-01 MED ORDER — ALBUTEROL SULFATE HFA 108 (90 BASE) MCG/ACT IN AERS: 2.0000 | INHALATION_SPRAY | Freq: Four times a day (QID) | RESPIRATORY_TRACT | 6 refills | Status: DC | PRN

## 2019-09-01 MED ORDER — TRIAZOLAM 0.25 MG PO TABS: 0.2500 mg | ORAL_TABLET | Freq: Every evening | ORAL | 5 refills | Status: DC | PRN

## 2019-09-01 MED ORDER — FLUTICASONE-SALMETEROL 100-50 MCG/DOSE IN AEPB: INHALATION_SPRAY | RESPIRATORY_TRACT | 10 refills | Status: DC

## 2019-09-01 NOTE — Assessment & Plan Note (Signed)
Well adult Orders Placed This Encounter  Procedures  . COMPLETE METABOLIC PANEL WITH GFR  . CBC  . Lipid Profile  . PSA  Screening: PSA, reports he has had recent colonoscopy.  Will find records.  Immunizations:  UTD Anticipatory guidance/Risk Factor Reduction:  Recommendations per AVS

## 2019-09-01 NOTE — Patient Instructions (Signed)

## 2019-09-01 NOTE — Progress Notes (Signed)
Jonathon Taylor - 74 y.o. male MRN [id number]  Date of birth: [birth year]  Subjective Chief Complaint  Patient presents with  . Annual Exam    HPI Jonathon Taylor is a 74 y.o. male with history of BPH s/p robot assisted simple prostatectomy, reactive airway disease, and insomnia here today for annual exam.  He has been doing well and denies new concerns today.   Insomnia remains well managed with triazolam.  He has tried other medications previously without adequate control of symptoms.   RAD has been fairly well controlled although he feels that Advair worked better for him and was cheaper. He would like to switch back.   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    He has had COVID vaccine.   Review of Systems  Constitutional: Negative for chills, fever, malaise/fatigue and weight loss.  HENT: Negative for congestion, ear pain and sore throat.   Eyes: Negative for blurred vision, double vision and pain.  Respiratory: Negative for cough and shortness of breath.   Cardiovascular: Negative for chest pain and palpitations.  Gastrointestinal: Negative for abdominal pain, blood in stool, constipation, heartburn and nausea.  Genitourinary: Negative for dysuria and urgency.  Musculoskeletal: Negative for joint pain and myalgias.  Neurological: Negative for dizziness and headaches.  Endo/Heme/Allergies: Does not bruise/bleed easily.  Psychiatric/Behavioral: Negative for depression. The patient is not nervous/anxious and does not have insomnia.     No Known Allergies  Past Medical History:  Diagnosis Date  . Anemia    ON IRON FOR PRIOR ANEMIA R/T BLEEDING BEFORE TURBT   . GERD (gastroesophageal reflux disease)   . H/O cardiac catheterization 2004   negative;Dr Tollie Eth  . Personal history of colonic polyps 08/12/2015  . Prostate enlargement   . Seasonal allergies   . Skin cancer    Dr [name]    Past  Surgical History:  Procedure Laterality Date  . arthroscopic knee surgery Left 1995  . BASAL CELL CARCINOMA EXCISION     2000-2017  . CARDIAC CATHETERIZATION  02/27/2003   per patient " i had spot on symptoms of a heart attack and a specialist, dr Tollie Eth ,  was brought in and after i had  the cath ; they said it was bad reflux . " per patient , after changing diet , no reccurrence of symptoms   . COLONOSCOPY    . INGUINAL HERNIA REPAIR Bilateral 03/12/2017   Procedure: LAPAROSCOPIC BILATERAL INGUINAL HERNIA WITH  MESH;  Surgeon: Clovis Riley, MD;  Location: [facility];  Service: General;  Laterality: Bilateral;  . INSERTION OF MESH N/A 03/12/2017   Procedure: INSERTION OF MESH;  Surgeon: Clovis Riley, MD;  Location: [facility];  Service: General;  Laterality: N/A;  . TRANSURETHRAL RESECTION OF BLADDER TUMOR N/A 07/08/2017   Procedure: CYSTO CLOT EVACUATION AND FULGRATION;  Surgeon: Irine Seal, MD;  Location: [facility];  Service: Urology;  Laterality: N/A;  . XI ROBOTIC ASSISTED SIMPLE PROSTATECTOMY N/A 09/19/2017   Procedure: XI ROBOTIC ASSISTED SIMPLE PROSTATECTOMY;  Surgeon: Cleon Gustin, MD;  Location: [facility];  Service: Urology;  Laterality: N/A;    Social History   Socioeconomic History  . Marital status: Married    Spouse name: Not on file  . Number of children: Not on file  . Years of education: Not on file  . Highest education level: Not on file  Occupational History  . Occupation: Professor  Tobacco Use  . Smoking status: Never Smoker  . Smokeless tobacco: Former Network engineer and Sexual Activity  . Alcohol use: Yes    Alcohol/week: 0.0 standard drinks    Comment: 2 glasses of red wine nightly  . Drug use: No  . Sexual activity: Yes  Other Topics Concern  . Not on file  Social History Narrative   Married. Education: Other. Exercise: Bike/light weights everyday for 45-60 minutes.   Social Determinants of Health   Financial Resource Strain:   .  Difficulty of Paying Living Expenses:   Food Insecurity:   . Worried About Charity fundraiser in the Last Year:   . Arboriculturist in the Last Year:   Transportation Needs:   . Film/video editor (Medical):   Marland Kitchen Lack of Transportation (Non-Medical):   Physical Activity:   . Days of Exercise per Week:   . Minutes of Exercise per Session:   Stress:   . Feeling of Stress :   Social Connections:   . Frequency of Communication with Friends and Family:   . Frequency of Social Gatherings with Friends and Family:   . Attends Religious Services:   . Active Member of Clubs or Organizations:   . Attends Archivist Meetings:   Marland Kitchen Marital Status:     Family History  Problem Relation Age of Onset  . Heart disease Mother   . Heart disease Maternal Grandfather   . Heart disease Father   . Heart disease Sister   . Colon cancer Neg Hx     Health Maintenance  Topic Date Due  . COLONOSCOPY  08/08/2018  . INFLUENZA VACCINE  10/25/2019  . TETANUS/TDAP  05/02/2026  . COVID-19 Vaccine  Completed  . Hepatitis C Screening  Completed  . PNA vac Low Risk Adult  Completed     ----------------------------------------------------------------------------------------------------------------------------------------------------------------------------------------------------------------- Physical Exam BP (!) 152/71 (BP Location: Left Arm, Patient Position: Sitting, Cuff Size: Normal)   Pulse 63   Temp 98.1 F (36.7 C) (Oral)   Ht 5' 8.9" (1.75 m)   Wt 170 lb 14.4 oz (77.5 kg)   SpO2 96%   BMI 25.31 kg/m   Physical Exam Constitutional:      General: He is not in acute distress. HENT:     Head: Normocephalic and atraumatic.     Right Ear: Tympanic membrane and external ear normal.     Left Ear: Tympanic membrane and external ear normal.  Eyes:     General: No scleral icterus. Neck:     Thyroid: No thyromegaly.  Cardiovascular:     Rate and Rhythm: Normal rate and regular  rhythm.     Heart sounds: Normal heart sounds.  Pulmonary:     Effort: Pulmonary effort is normal.     Breath sounds: Normal breath sounds.  Abdominal:     General: Bowel sounds are normal. There is no distension.     Palpations: Abdomen is soft.     Tenderness: There is no abdominal tenderness. There is no guarding.  Musculoskeletal:     Cervical back: Normal range of motion.  Lymphadenopathy:     Cervical: No cervical adenopathy.  Skin:    General: Skin is warm and dry.     Findings: No rash.  Neurological:     Mental Status: He is alert and oriented to person, place, and time.     Cranial Nerves: No cranial nerve deficit.     Motor: No abnormal muscle tone.  Psychiatric:        Mood and Affect: Mood normal.        Behavior: Behavior normal.     ------------------------------------------------------------------------------------------------------------------------------------------------------------------------------------------------------------------- Assessment and Plan  Well adult exam Well adult Orders Placed This Encounter  Procedures  . COMPLETE METABOLIC PANEL WITH GFR  . CBC  . Lipid Profile  . PSA  Screening: PSA, reports he has had recent colonoscopy.  Will find records.  Immunizations:  UTD Anticipatory guidance/Risk Factor Reduction:  Recommendations per AVS   Meds ordered this encounter  Medications  . albuterol (VENTOLIN HFA) 108 (90 Base) MCG/ACT inhaler    Sig: Inhale 2 puffs into the lungs every 6 (six) hours as needed for wheezing.    Dispense:  8 g    Refill:  6  . triazolam (HALCION) 0.25 MG tablet    Sig: Take 1 tablet (0.25 mg total) by mouth at bedtime as needed for sleep.    Dispense:  30 tablet    Refill:  5  . Fluticasone-Salmeterol (ADVAIR DISKUS) 100-50 MCG/DOSE AEPB    Sig: INHALE 1 PUFF INTO THE LUNGS 2 TIMES DAILY    Dispense:  60 each    Refill:  10    Return in about 1 year (around 08/31/2020) for annual exam.    This  visit occurred during the SARS-CoV-2 public health emergency.  Safety protocols were in place, including screening questions prior to the visit, additional usage of staff PPE, and extensive cleaning of exam room while observing appropriate contact time as indicated for disinfecting solutions.

## 2019-09-02 LAB — COMPLETE METABOLIC PANEL WITH GFR
AG Ratio: 1.9 (calc) (ref 1.0–2.5)
ALT: 11 U/L (ref 9–46)
AST: 14 U/L (ref 10–35)
Albumin: 4.2 g/dL (ref 3.6–5.1)
Alkaline phosphatase (APISO): 40 U/L (ref 35–144)
BUN: 19 mg/dL (ref 7–25)
CO2: 26 mmol/L (ref 20–32)
Calcium: 9.2 mg/dL (ref 8.6–10.3)
Chloride: 107 mmol/L (ref 98–110)
Creat: 0.91 mg/dL (ref 0.70–1.18)
GFR, Est African American: 97 mL/min/{1.73_m2} (ref >=60)
GFR, Est Non African American: 84 mL/min/{1.73_m2} (ref >=60)
Globulin: 2.2 g/dL (calc) (ref 1.9–3.7)
Glucose, Bld: 88 mg/dL (ref 65–99)
Potassium: 4.5 mmol/L (ref 3.5–5.3)
Sodium: 141 mmol/L (ref 135–146)
Total Bilirubin: 0.5 mg/dL (ref 0.2–1.2)
Total Protein: 6.4 g/dL (ref 6.1–8.1)

## 2019-09-02 LAB — CBC
HCT: 42.8 % (ref 38.5–50.0)
Hemoglobin: 13.8 g/dL (ref 13.2–17.1)
MCH: 26.6 pg — ABNORMAL LOW (ref 27.0–33.0)
MCHC: 32.2 g/dL (ref 32.0–36.0)
MCV: 82.6 fL (ref 80.0–100.0)
MPV: 10.7 fL (ref 7.5–12.5)
Platelets: 243 10*3/uL (ref 140–400)
RBC: 5.18 10*6/uL (ref 4.20–5.80)
RDW: 13.9 % (ref 11.0–15.0)
WBC: 4.3 10*3/uL (ref 3.8–10.8)

## 2019-09-02 LAB — LIPID PANEL
Cholesterol: 217 mg/dL — ABNORMAL HIGH (ref <200)
HDL: 100 mg/dL (ref >=40)
LDL Cholesterol (Calc): 101 mg/dL (calc) — ABNORMAL HIGH
Non-HDL Cholesterol (Calc): 117 mg/dL (calc) (ref <130)
Total CHOL/HDL Ratio: 2.2 (calc) (ref <5.0)
Triglycerides: 70 mg/dL (ref <150)

## 2019-09-02 LAB — PSA: PSA: 0.2 ng/mL (ref <=4.0)

## 2019-10-13 ENCOUNTER — Ambulatory Visit: Payer: BC Managed Care – PPO | Admitting: Dermatology

## 2019-10-13 ENCOUNTER — Encounter: Payer: Self-pay | Admitting: Dermatology

## 2019-10-13 ENCOUNTER — Other Ambulatory Visit: Payer: Self-pay

## 2019-10-13 DIAGNOSIS — L570 Actinic keratosis: Secondary | ICD-10-CM | POA: Diagnosis not present

## 2019-10-13 DIAGNOSIS — C44219 Basal cell carcinoma of skin of left ear and external auricular canal: Secondary | ICD-10-CM | POA: Diagnosis not present

## 2019-10-13 DIAGNOSIS — D0462 Carcinoma in situ of skin of left upper limb, including shoulder: Secondary | ICD-10-CM | POA: Diagnosis not present

## 2019-10-13 DIAGNOSIS — C4492 Squamous cell carcinoma of skin, unspecified: Secondary | ICD-10-CM

## 2019-10-13 DIAGNOSIS — C44622 Squamous cell carcinoma of skin of right upper limb, including shoulder: Secondary | ICD-10-CM | POA: Diagnosis not present

## 2019-10-13 DIAGNOSIS — C4491 Basal cell carcinoma of skin, unspecified: Secondary | ICD-10-CM

## 2019-10-13 DIAGNOSIS — D485 Neoplasm of uncertain behavior of skin: Secondary | ICD-10-CM | POA: Diagnosis not present

## 2019-10-13 HISTORY — DX: Basal cell carcinoma of skin, unspecified: C44.91

## 2019-10-13 HISTORY — DX: Squamous cell carcinoma of skin, unspecified: C44.92

## 2019-10-13 NOTE — Patient Instructions (Signed)

## 2019-10-15 ENCOUNTER — Telehealth: Payer: Self-pay

## 2019-10-15 NOTE — Telephone Encounter (Signed)
-----   Message from [name], MD sent at 10/15/2019  6:46 AM EDT ----- Schedule surgery with Dr. Darene Lamer

## 2019-10-15 NOTE — Telephone Encounter (Signed)
Patient is returning call about pathology results. 

## 2019-10-15 NOTE — Telephone Encounter (Signed)
Phone call to patient with his pathology results. Voicemail left for patient to give the office a call back.  ?

## 2019-10-16 NOTE — Telephone Encounter (Signed)
Left message for patient to call office to schedule surgery.

## 2019-10-16 NOTE — Telephone Encounter (Signed)
-----   Message from [name], MD sent at 10/15/2019  6:46 AM EDT ----- Schedule surgery with Dr. Darene Lamer

## 2019-10-23 ENCOUNTER — Telehealth: Payer: Self-pay | Admitting: Dermatology

## 2019-10-23 ENCOUNTER — Encounter: Payer: Self-pay | Admitting: Dermatology

## 2019-10-23 NOTE — Telephone Encounter (Signed)
Pathology given to patient surgery made with Dr Denna Haggard.

## 2019-10-23 NOTE — Telephone Encounter (Signed)
Patient is calling for pathology results from last visit with Stuart Tafeen, MD 

## 2019-11-13 ENCOUNTER — Encounter: Payer: Self-pay | Admitting: Dermatology

## 2019-11-16 NOTE — Progress Notes (Signed)
   Follow-Up Visit   Subjective  Jonathon Taylor is a 74 y.o. male who presents for the following: Annual Exam (KA? X WEEKS).  New growths Location: Left arm, hand, neck Duration:  Quality:  Associated Signs/Symptoms: Modifying Factors:  Severity:  Timing: Context: History of dozens of nonmelanoma skin cancers.  Also multiple crusts on face.  Objective  Well appearing patient in no apparent distress; mood and affect are within normal limits.  All skin waist up examined.   Assessment & Plan    Neoplasm of uncertain behavior of skin (3) Left Hand - Posterior  Skin / nail biopsy Type of biopsy: tangential   Informed consent: discussed and consent obtained   Timeout: patient name, date of birth, surgical site, and procedure verified   Procedure prep:  Patient was prepped and draped in usual sterile fashion Prep type:  Chlorhexidine Anesthesia: the lesion was anesthetized in a standard fashion   Anesthetic:  1% lidocaine w/ epinephrine 1-100,000 local infiltration Instrument used: flexible razor blade   Hemostasis achieved with: ferric subsulfate   Outcome: patient tolerated procedure well   Post-procedure details: wound care instructions given    Specimen 1 - Surgical pathology Differential Diagnosis: bcc scc Check Margins: No  Right Forearm - Posterior  Skin / nail biopsy Type of biopsy: tangential   Informed consent: discussed and consent obtained   Timeout: patient name, date of birth, surgical site, and procedure verified   Procedure prep:  Patient was prepped and draped in usual sterile fashion Prep type:  Chlorhexidine Anesthesia: the lesion was anesthetized in a standard fashion   Anesthetic:  1% lidocaine w/ epinephrine 1-100,000 local infiltration Instrument used: flexible razor blade   Hemostasis achieved with: ferric subsulfate   Outcome: patient tolerated procedure well   Post-procedure details: wound care instructions given    Specimen 2 -  Surgical pathology Differential Diagnosis: bcc scc ka Check Margins: No  Left Parotid ear/neck  Skin / nail biopsy Type of biopsy: tangential   Informed consent: discussed and consent obtained   Timeout: patient name, date of birth, surgical site, and procedure verified   Procedure prep:  Patient was prepped and draped in usual sterile fashion Prep type:  Chlorhexidine Anesthesia: the lesion was anesthetized in a standard fashion   Anesthetic:  1% lidocaine w/ epinephrine 1-100,000 local infiltration Instrument used: flexible razor blade   Hemostasis achieved with: ferric subsulfate   Outcome: patient tolerated procedure well   Post-procedure details: wound care instructions given    Specimen 3 - Surgical pathology Differential Diagnosis: bcc scc Check Margins: No  AK (actinic keratosis) (2) Left Forehead; Right Forehead  Candidate for field therapy in the late fall or winter     I, [name], MD, have reviewed all documentation for this visit.  The documentation on 11/16/19 for the exam, diagnosis, procedures, and orders are all accurate and complete.

## 2019-11-27 IMAGING — CT CT RENAL STONE PROTOCOL
2 of 4 series · 17 of 46 positions shown, 19 images · non-contrast
Comparison: 09/08/2010

CLINICAL DATA: Difficulty urinating over the last 3 days.
Hematuria.

EXAM:
CT ABDOMEN AND PELVIS WITHOUT CONTRAST
TECHNIQUE: Multidetector CT imaging of the abdomen and pelvis was performed
following the standard protocol without IV contrast.

[Series 2: axial st · axial · 0.76mm/px · z∈[+1122,+1492]mm · 14 of 84 slices shown, 16 images]
[im 5/84  soft-tissue]
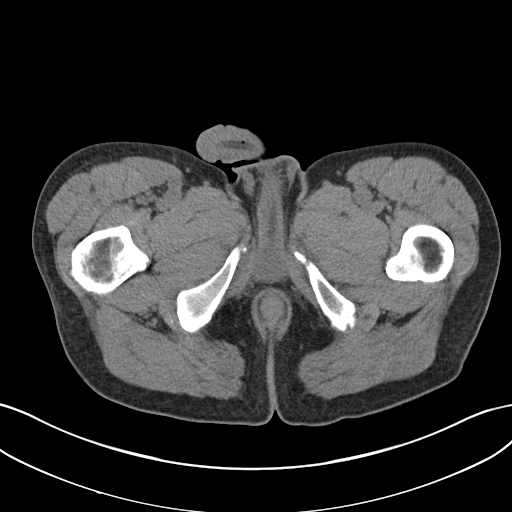
[im 5/84  bone]
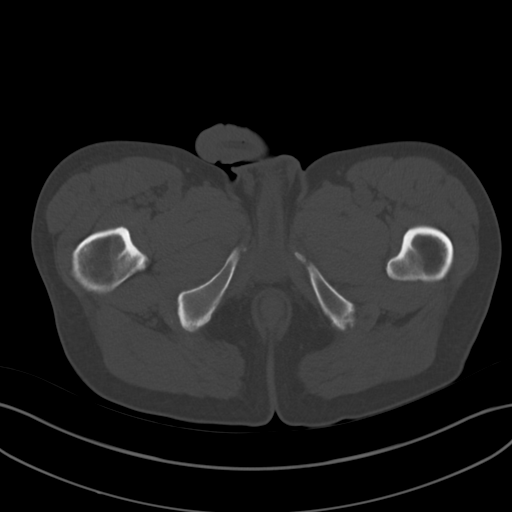
[im 13/84  soft-tissue]
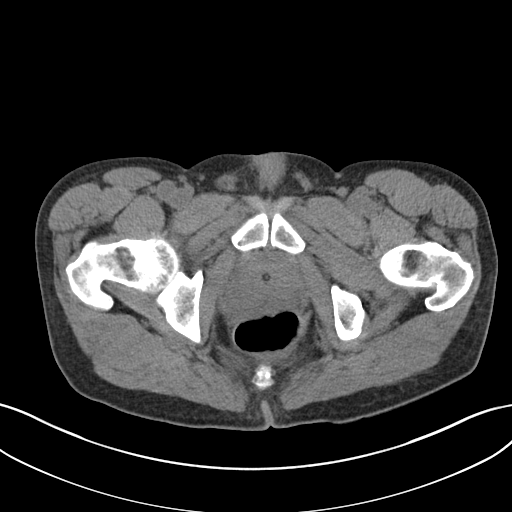
[im 17/84  soft-tissue]
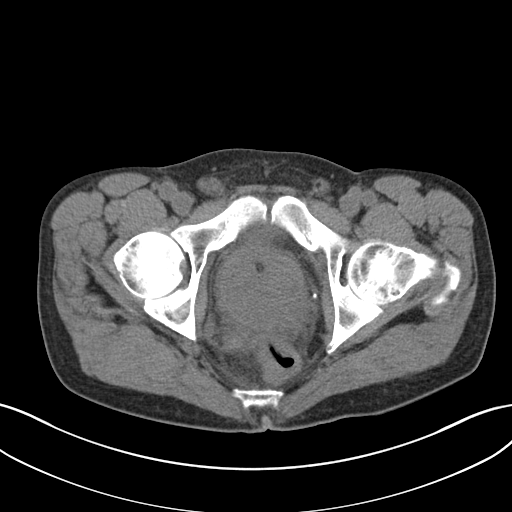
[im 21/84  soft-tissue]
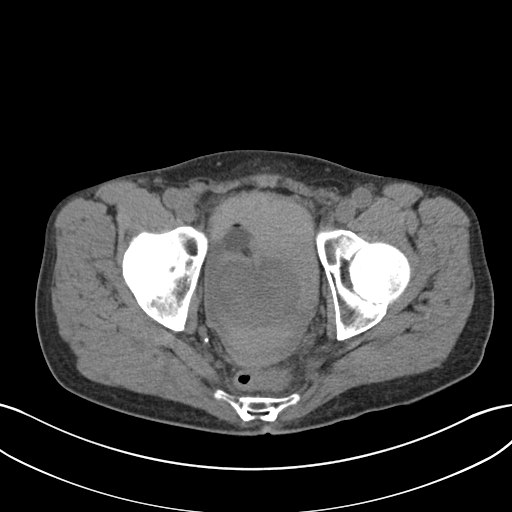
[im 30/84  soft-tissue]
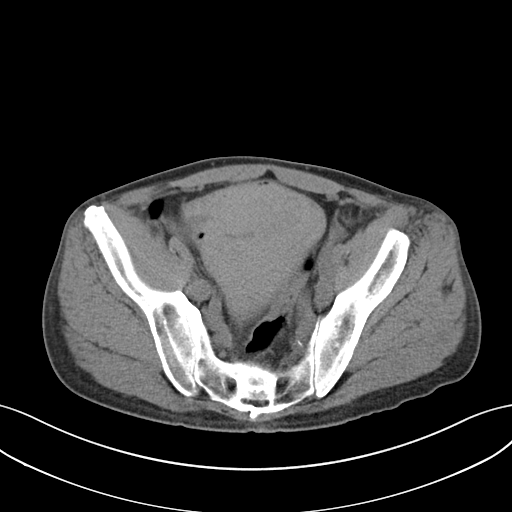
[im 34/84  soft-tissue]
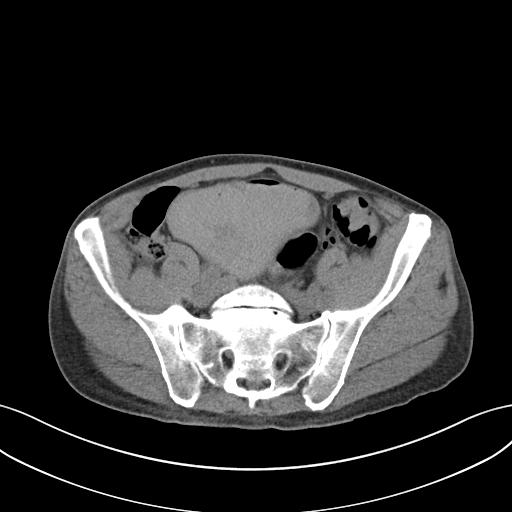
[im 38/84  soft-tissue]
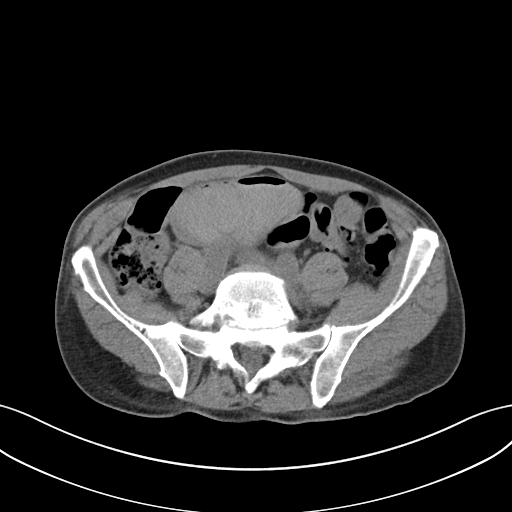
[im 46/84  soft-tissue]
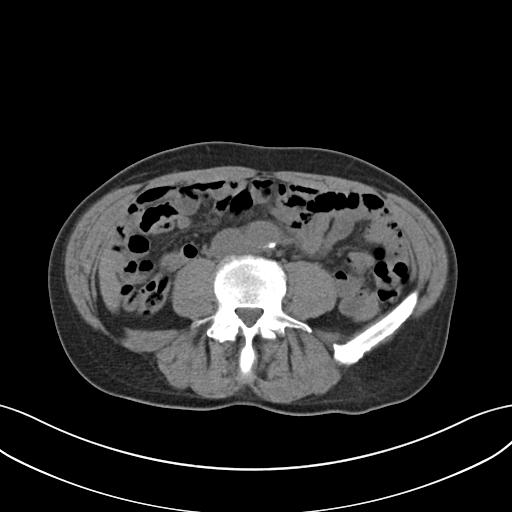
[im 50/84  soft-tissue]
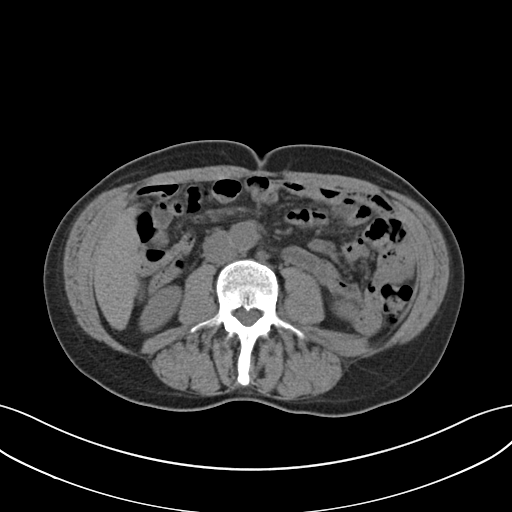
[im 50/84  bone]
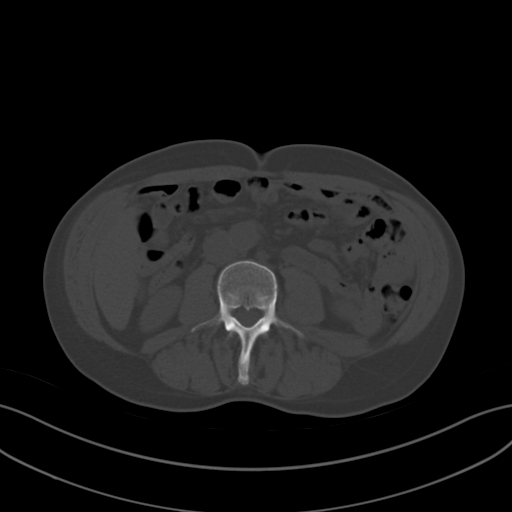
[im 54/84  soft-tissue]
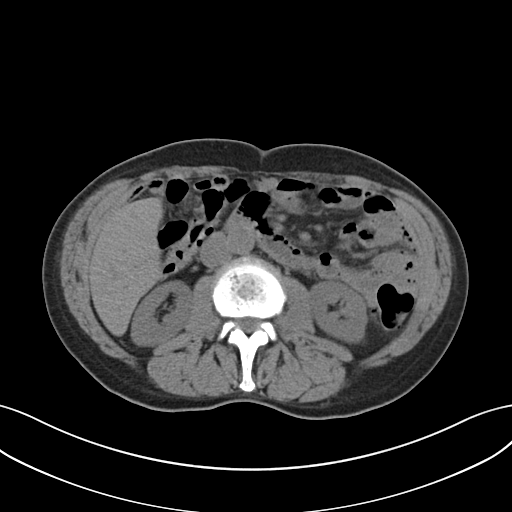
[im 63/84  soft-tissue]
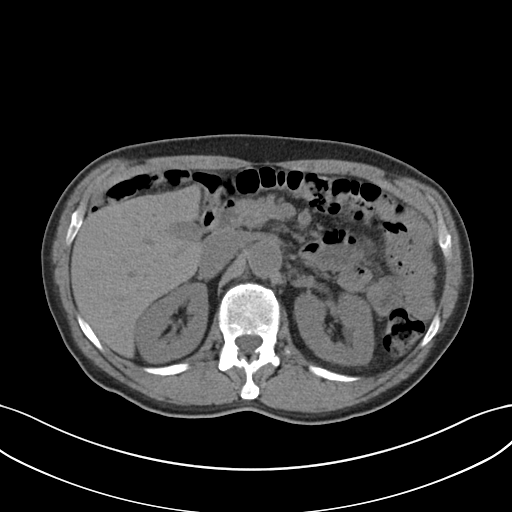
[im 67/84  soft-tissue]
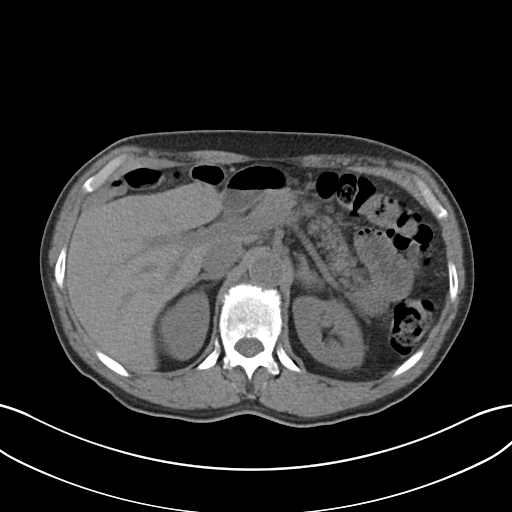
[im 71/84  soft-tissue]
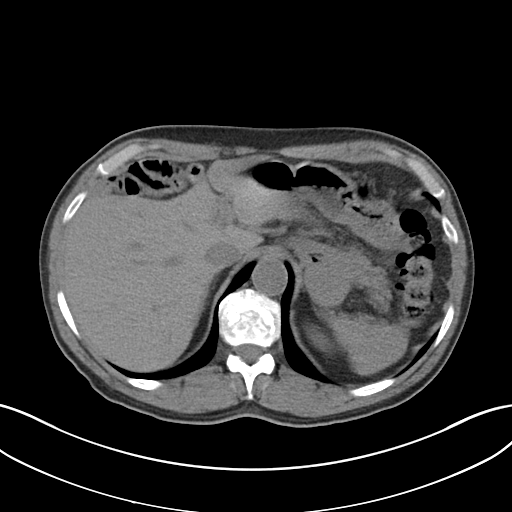
[im 79/84  soft-tissue]
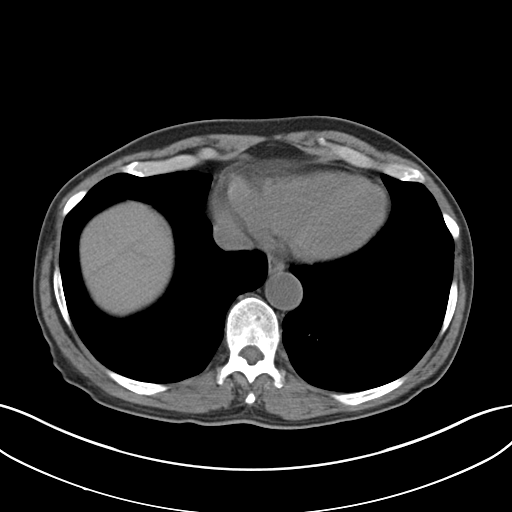

[Series 5: coronal · coronal · 0.74mm/px · 3 of 118 slices shown]
[im 40/118  soft-tissue]
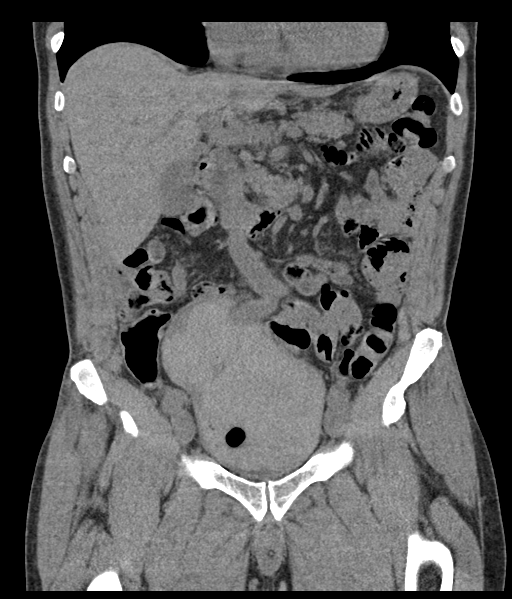
[im 53/118  soft-tissue]
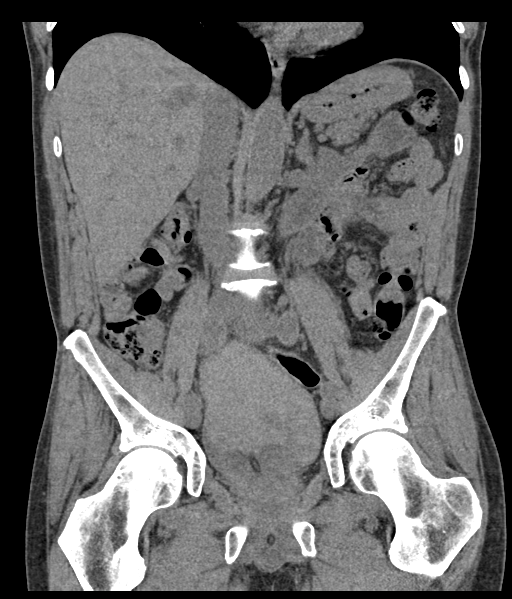
[im 66/118  soft-tissue]
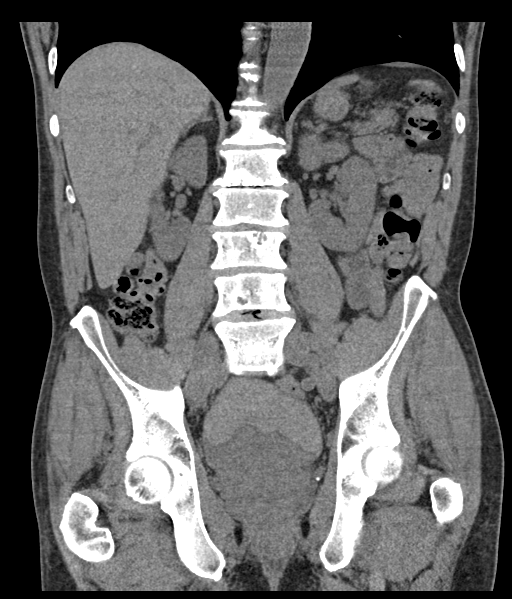

[17 of 46 positions shown; findings below may reference images not displayed]

FINDINGS: Lower chest: Normal

Hepatobiliary: Chronic benign cyst of the left lobe of the liver,
unchanged. No calcified gallstones.

Pancreas: Normal

Spleen: Normal

Adrenals/Urinary Tract: Adrenal glands are normal. Kidneys are
normal. Foley catheter is present within the bladder. Bladder is
full of hyperdense material consistent with blood. The prostate is
massively enlarged. Mild asymmetric wall thickening at the dome of
the bladder.

Stomach/Bowel: Normal

Vascular/Lymphatic: Aortic atherosclerosis.

Reproductive: Otherwise negative.

Other: No free fluid or air.

Musculoskeletal: Chronic degenerative spondylosis. Bilateral pars
defects L5 with anterolisthesis 11 mm.
IMPRESSION: Bladder is distended with hyperdense material presumably
representing blood clot. Foley catheter in the bladder. Massively
enlarged prostate. Some asymmetric thickening of the bladder wall at
the dome. Bladder wall mass not excluded by this exam.

## 2019-12-09 ENCOUNTER — Ambulatory Visit: Payer: BC Managed Care – PPO | Admitting: Dermatology

## 2019-12-31 ENCOUNTER — Encounter: Payer: BC Managed Care – PPO | Admitting: Dermatology

## 2020-01-18 ENCOUNTER — Ambulatory Visit (INDEPENDENT_AMBULATORY_CARE_PROVIDER_SITE_OTHER): Payer: BC Managed Care – PPO | Admitting: Dermatology

## 2020-01-18 ENCOUNTER — Other Ambulatory Visit: Payer: Self-pay

## 2020-01-18 DIAGNOSIS — D0462 Carcinoma in situ of skin of left upper limb, including shoulder: Secondary | ICD-10-CM

## 2020-01-18 DIAGNOSIS — C44622 Squamous cell carcinoma of skin of right upper limb, including shoulder: Secondary | ICD-10-CM

## 2020-01-18 DIAGNOSIS — D049 Carcinoma in situ of skin, unspecified: Secondary | ICD-10-CM

## 2020-01-18 DIAGNOSIS — C4492 Squamous cell carcinoma of skin, unspecified: Secondary | ICD-10-CM

## 2020-01-18 NOTE — Patient Instructions (Signed)

## 2020-01-20 ENCOUNTER — Encounter: Payer: Self-pay | Admitting: Dermatology

## 2020-01-20 NOTE — Progress Notes (Signed)
   Follow-Up Visit   Subjective  Jonathon Taylor is a 74 y.o. male who presents for the following: Procedure (scc right forearm, cis left hand, bcc left parotid ear/neck).  Skin cancers Location:  Duration:  Quality:  Associated Signs/Symptoms: Modifying Factors:  Severity:  Timing: Context: For treatment  Objective  Well appearing patient in no apparent distress; mood and affect are within normal limits.  A focused examination was performed including Head, neck, arms.. Relevant physical exam findings are noted in the Assessment and Plan.   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  Destruction of lesion Complexity: simple   Destruction method: electrodesiccation and curettage   Informed consent: discussed and consent obtained   Timeout:  patient name, date of birth, surgical site, and procedure verified Anesthesia: the lesion was anesthetized in a standard fashion   Anesthetic:  1% lidocaine w/ epinephrine 1-100,000 local infiltration Curettage performed in three different directions: Yes   Curettage cycles:  3 Lesion length (cm):  0.6 Lesion width (cm):  0.6 Margin per side (cm):  0 Final wound size (cm):  0.6 Hemostasis achieved with:  ferric subsulfate Outcome: patient tolerated procedure well with no complications   Additional details:  Wound innoculated with 5 fluorouracil solution.  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  Destruction of lesion Complexity: simple   Destruction method: electrodesiccation and curettage   Informed consent: discussed and consent obtained   Timeout:  patient name, date of birth, surgical site, and procedure verified Anesthesia: the lesion was anesthetized in a standard fashion   Anesthetic:  1% lidocaine w/ epinephrine 1-100,000 local infiltration Curettage performed in three different directions: Yes   Curettage cycles:  3 Lesion length (cm):  1.2 Lesion  width (cm):  1 Margin per side (cm):  0 Final wound size (cm):  1.2 Hemostasis achieved with:  ferric subsulfate Outcome: patient tolerated procedure well with no complications   Additional details:  Wound innoculated with 5 fluorouracil solution.      I, [name], MD, have reviewed all documentation for this visit.  The documentation on 01/20/20 for the exam, diagnosis, procedures, and orders are all accurate and complete.

## 2020-02-15 ENCOUNTER — Other Ambulatory Visit: Payer: Self-pay | Admitting: Family Medicine

## 2020-03-24 ENCOUNTER — Other Ambulatory Visit: Payer: Self-pay

## 2020-03-24 ENCOUNTER — Ambulatory Visit (INDEPENDENT_AMBULATORY_CARE_PROVIDER_SITE_OTHER): Payer: BC Managed Care – PPO | Admitting: Dermatology

## 2020-03-24 DIAGNOSIS — C4441 Basal cell carcinoma of skin of scalp and neck: Secondary | ICD-10-CM | POA: Diagnosis not present

## 2020-03-24 DIAGNOSIS — L570 Actinic keratosis: Secondary | ICD-10-CM | POA: Diagnosis not present

## 2020-03-24 DIAGNOSIS — D0461 Carcinoma in situ of skin of right upper limb, including shoulder: Secondary | ICD-10-CM | POA: Diagnosis not present

## 2020-03-24 NOTE — Patient Instructions (Signed)

## 2020-07-02 ENCOUNTER — Other Ambulatory Visit: Payer: Self-pay | Admitting: Family Medicine

## 2020-07-18 ENCOUNTER — Other Ambulatory Visit: Payer: Self-pay

## 2020-07-18 ENCOUNTER — Encounter: Payer: Self-pay | Admitting: Dermatology

## 2020-07-18 ENCOUNTER — Ambulatory Visit (INDEPENDENT_AMBULATORY_CARE_PROVIDER_SITE_OTHER): Payer: Medicare Other | Admitting: Dermatology

## 2020-07-18 DIAGNOSIS — L570 Actinic keratosis: Secondary | ICD-10-CM | POA: Diagnosis not present

## 2020-07-18 DIAGNOSIS — C44299 Other specified malignant neoplasm of skin of left ear and external auricular canal: Secondary | ICD-10-CM

## 2020-07-18 DIAGNOSIS — D485 Neoplasm of uncertain behavior of skin: Secondary | ICD-10-CM

## 2020-07-18 DIAGNOSIS — Z8589 Personal history of malignant neoplasm of other organs and systems: Secondary | ICD-10-CM

## 2020-07-18 DIAGNOSIS — Z85828 Personal history of other malignant neoplasm of skin: Secondary | ICD-10-CM

## 2020-07-18 NOTE — Patient Instructions (Signed)

## 2020-07-26 ENCOUNTER — Telehealth: Payer: Self-pay | Admitting: *Deleted

## 2020-07-26 NOTE — Telephone Encounter (Signed)
-----   Message from [name], MD sent at 07/26/2020  6:31 AM EDT ----- Schedule Mohs

## 2020-07-26 NOTE — Telephone Encounter (Signed)
Left patient message to call back.

## 2020-07-27 NOTE — Telephone Encounter (Signed)
Patient left message on office voice mail that he was returning our phone call. °

## 2020-07-28 ENCOUNTER — Encounter: Payer: Self-pay | Admitting: Dermatology

## 2020-07-28 NOTE — Progress Notes (Signed)
   Follow-Up Visit   Subjective  Jonathon Taylor is a 75 y.o. male who presents for the following: Follow-up (No new concerns).  Some new growths to be checked Location:  Duration:  Quality:  Associated Signs/Symptoms: Modifying Factors:  Severity:  Timing: Context:   Objective  Well appearing patient in no apparent distress; mood and affect are within normal limits. Objective  Head - Anterior (Face): No sign recurrence  Objective  Head - Anterior (Face): No sign recurrence  Objective  Left Upper Back: Hornlike 1 cm crust     Objective  Left Posterior Auricle ear: 1 cm waxy plus pearly nodule       All skin waist up examined.   Assessment & Plan    History of basal cell carcinoma (BCC) Head - Anterior (Face)  Recheck as needed  History of squamous cell carcinoma Head - Anterior (Face)  Recheck as needed  Neoplasm of uncertain behavior of skin (2) Left Upper Back  Skin / nail biopsy Type of biopsy: tangential   Informed consent: discussed and consent obtained   Timeout: patient name, date of birth, surgical site, and procedure verified   Anesthesia: the lesion was anesthetized in a standard fashion   Anesthetic:  1% lidocaine w/ epinephrine 1-100,000 local infiltration Instrument used: flexible razor blade   Hemostasis achieved with: aluminum chloride and electrodesiccation   Outcome: patient tolerated procedure well   Post-procedure details: wound care instructions given    Destruction of lesion Complexity: simple   Destruction method: electrodesiccation and curettage   Informed consent: discussed and consent obtained   Timeout:  patient name, date of birth, surgical site, and procedure verified Anesthesia: the lesion was anesthetized in a standard fashion   Anesthetic:  1% lidocaine w/ epinephrine 1-100,000 local infiltration Curettage performed in three different directions: Yes   Electrodesiccation performed over the curetted area:  Yes   Curettage cycles:  3 Margin per side (cm):  0.1 Final wound size (cm):  1 Hemostasis achieved with:  aluminum chloride Outcome: patient tolerated procedure well with no complications   Post-procedure details: wound care instructions given    Specimen 1 - Surgical pathology Differential Diagnosis: bcc scc curet and cautery   Check Margins: No  Left Posterior Auricle ear  Skin / nail biopsy Type of biopsy: tangential   Informed consent: discussed and consent obtained   Timeout: patient name, date of birth, surgical site, and procedure verified   Procedure prep:  Patient was prepped and draped in usual sterile fashion (Non sterile) Prep type:  Chlorhexidine Anesthesia: the lesion was anesthetized in a standard fashion   Anesthetic:  1% lidocaine w/ epinephrine 1-100,000 local infiltration Instrument used: flexible razor blade   Outcome: patient tolerated procedure well   Post-procedure details: wound care instructions given    Specimen 2 - Surgical pathology Differential Diagnosis: bcc scc +mohs  Check Margins: No      I, [name], MD, have reviewed all documentation for this visit.  The documentation on 07/28/20 for the exam, diagnosis, procedures, and orders are all accurate and complete.

## 2020-08-01 NOTE — Telephone Encounter (Signed)
Path to patient and referral sent to mohs for Dr Link Snuffer

## 2020-08-16 ENCOUNTER — Encounter: Payer: Self-pay | Admitting: Dermatology

## 2020-08-16 ENCOUNTER — Ambulatory Visit (INDEPENDENT_AMBULATORY_CARE_PROVIDER_SITE_OTHER): Payer: Medicare Other | Admitting: Dermatology

## 2020-08-16 ENCOUNTER — Other Ambulatory Visit: Payer: Self-pay

## 2020-08-16 DIAGNOSIS — C44629 Squamous cell carcinoma of skin of left upper limb, including shoulder: Secondary | ICD-10-CM | POA: Diagnosis not present

## 2020-08-16 DIAGNOSIS — D485 Neoplasm of uncertain behavior of skin: Secondary | ICD-10-CM

## 2020-08-16 NOTE — Patient Instructions (Signed)

## 2020-08-16 NOTE — Progress Notes (Signed)
   Follow-Up Visit   Subjective  Jonathon Taylor is a 75 y.o. male who presents for the following: Skin Problem (Patient here today for lesion on his left upper arm x 1 month. Per patient the lesion is painful to touch, no bleeding. Personal history of non mole skin cancer. ).  Rapid growth 67f new somewhat tender nodule left upper arm Location:  Duration:  Quality:  Associated Signs/Symptoms: Modifying Factors:  Severity:  Timing: Context: History of multiple nonmelanoma skin cancers  Objective  Well appearing patient in no apparent distress; mood and affect are within normal limits. Objective  Left Upper Arm - Posterior: 1 cm volcano like slightly tender pink nodule strongly suggestive of prescribed cell carcinoma/keratoacanthoma.         A focused examination was performed including Although all skin waist up briefly examined, focus was on the new lesion left upper arm. Relevant physical exam findings are noted in the Assessment and Plan.   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, including shoulder Left Upper Arm - Posterior  Skin / nail biopsy Type of biopsy: tangential   Informed consent: discussed and consent obtained   Timeout: patient name, date of birth, surgical site, and procedure verified   Procedure prep:  Patient was prepped and draped in usual sterile fashion (Non sterile) Prep type:  Chlorhexidine Anesthesia: the lesion was anesthetized in a standard fashion   Anesthetic:  1% lidocaine w/ epinephrine 1-100,000 local infiltration Instrument used: flexible razor blade   Hemostasis achieved with: ferric subsulfate and electrodesiccation   Outcome: patient tolerated procedure well   Post-procedure details: sterile dressing applied and wound care instructions given   Dressing type: bandage and petrolatum    Destruction of lesion Complexity: simple   Destruction method: electrodesiccation and curettage   Informed  consent: discussed and consent obtained   Timeout:  patient name, date of birth, surgical site, and procedure verified Anesthesia: the lesion was anesthetized in a standard fashion   Anesthetic:  1% lidocaine w/ epinephrine 1-100,000 local infiltration Curettage performed in three different directions: Yes   Electrodesiccation performed over the curetted area: Yes   Curettage cycles:  1 Lesion length (cm):  1.1 Lesion width (cm):  1.1 Margin per side (cm):  0 Final wound size (cm):  1.1 Hemostasis achieved with:  ferric subsulfate and electrodesiccation Outcome: patient tolerated procedure well with no complications   Post-procedure details: sterile dressing applied and wound care instructions given   Dressing type: bandage and petrolatum    Specimen 1 - Surgical pathology Differential Diagnosis: R/O SCC-KA - treated after biopsy  Check Margins: No  After shave biopsy the obvious deep keratin was treated by curettage plus electrocautery.      I, [name], MD, have reviewed all documentation for this visit.  The documentation on 08/30/20 for the exam, diagnosis, procedures, and orders are all accurate and complete.

## 2020-08-23 ENCOUNTER — Telehealth: Payer: Self-pay

## 2020-08-23 NOTE — Telephone Encounter (Signed)
Call made to [facility] with [facility] [phone] to add arm to final path.

## 2020-08-23 NOTE — Telephone Encounter (Signed)
-----   Message from [name], MD sent at 08/19/2020  6:20 PM EDT ----- Nursing home was treated with the biopsy so no further immediate immune is indicated.  However, on the pathology information the location on the arm was omitted so we should probably request that this be added on the final pathology report.

## 2020-08-25 ENCOUNTER — Telehealth: Payer: Self-pay

## 2020-08-25 NOTE — Telephone Encounter (Signed)
Phone call to patient with his pathology results. Voicemail left for patient to give the office a call back.  ?

## 2020-08-25 NOTE — Telephone Encounter (Signed)
-----   Message from [name], MD sent at 08/24/2020  5:29 PM EDT ----- Result note should read nonmelanoma cancer was treated.Jonathon KitchenMarland Taylor

## 2020-08-30 ENCOUNTER — Encounter: Payer: Self-pay | Admitting: Dermatology

## 2020-08-30 NOTE — Telephone Encounter (Signed)
Patient left message on office voice mail returning our phone call.

## 2020-09-01 NOTE — Telephone Encounter (Signed)
Phone call to patient with his pathology results. Patient aware of results.  

## 2020-09-07 ENCOUNTER — Encounter: Payer: Self-pay | Admitting: Family Medicine

## 2020-09-07 ENCOUNTER — Other Ambulatory Visit: Payer: Self-pay

## 2020-09-07 ENCOUNTER — Ambulatory Visit (INDEPENDENT_AMBULATORY_CARE_PROVIDER_SITE_OTHER): Payer: Medicare Other

## 2020-09-07 ENCOUNTER — Ambulatory Visit (INDEPENDENT_AMBULATORY_CARE_PROVIDER_SITE_OTHER): Payer: Medicare Other | Admitting: Family Medicine

## 2020-09-07 VITALS — BP 116/64 | HR 65 | Temp 97.5°F | Ht 68.0 in | Wt 166.9 lb

## 2020-09-07 DIAGNOSIS — R350 Frequency of micturition: Secondary | ICD-10-CM

## 2020-09-07 DIAGNOSIS — N401 Enlarged prostate with lower urinary tract symptoms: Secondary | ICD-10-CM

## 2020-09-07 DIAGNOSIS — E7800 Pure hypercholesterolemia, unspecified: Secondary | ICD-10-CM | POA: Diagnosis not present

## 2020-09-07 DIAGNOSIS — Z1211 Encounter for screening for malignant neoplasm of colon: Secondary | ICD-10-CM

## 2020-09-07 DIAGNOSIS — M25561 Pain in right knee: Secondary | ICD-10-CM

## 2020-09-07 DIAGNOSIS — G8929 Other chronic pain: Secondary | ICD-10-CM

## 2020-09-07 DIAGNOSIS — M25562 Pain in left knee: Secondary | ICD-10-CM

## 2020-09-07 DIAGNOSIS — Z0000 Encounter for general adult medical examination without abnormal findings: Secondary | ICD-10-CM

## 2020-09-07 NOTE — Patient Instructions (Signed)
  [name] , Thank you for taking time to come for your Medicare Wellness Visit. I appreciate your ongoing commitment to your health goals. Please review the following plan we discussed and let me know if I can assist you in the future.   These are the goals we discussed:  Goals   None     This is a list of the screening recommended for you and due dates:  Health Maintenance  Topic Date Due   Colon Cancer Screening  08/08/2018   COVID-19 Vaccine (3 - Moderna risk series) 06/19/2019   Flu Shot  10/24/2020   Tetanus Vaccine  05/02/2026   Hepatitis C Screening: USPSTF Recommendation to screen - Ages 18-79 yo.  Completed   Pneumonia vaccines  Completed   Zoster (Shingles) Vaccine  Completed   HPV Vaccine  Aged Out

## 2020-09-07 NOTE — Progress Notes (Signed)
Jonathon Taylor - 76 y.o. male MRN [id number]  Date of birth: Sep 09, 1945   Subjective Chief Complaint  Patient presents with   Medicare Wellness    HPI Here today for initial annual wellness visit No Known Allergies  Past Medical History:  Diagnosis Date   Anemia    ON IRON FOR PRIOR ANEMIA R/T BLEEDING BEFORE TURBT    Basal cell carcinoma 05/23/1992   LEFT UPPER BACK NEAR SCAR   BCC (basal cell carcinoma of skin) 11/22/1993   BEHIND LEFT EAR tx EFUDEX   BCC (basal cell carcinoma of skin) 03/11/2003   RIGHT SHOULDER TX WITH BX   BCC (basal cell carcinoma of skin) 03/11/2003   LEFT SHOULDER TX WITH BX   BCC (basal cell carcinoma of skin) 11/24/2013   right forearm tx picato   BCC (basal cell carcinoma of skin) 04/19/2014   right shoulder inferior tx with bx   BCC (basal cell carcinoma of skin) 02/26/2018   right forearm tx cx3 68fu   BCC (basal cell carcinoma of skin)    GERD (gastroesophageal reflux disease)    H/O cardiac catheterization 2004   negative;Dr Tollie Eth   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   Left Parotid Ear/Neck   Personal history of colonic polyps 08/12/2015   Prostate enlargement    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   left temple tx cx3 72fu   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   right shoulder front tx with bx   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   top right shoulder tx with bx   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   below left ear tx with bx   SCC (squamous cell carcinoma) 03/03/2008   left temple tx efudex   SCC (squamous cell carcinoma) 11/14/2011   upper right arm tx with bx   SCC (squamous cell carcinoma) 11/24/2013   right sideburn picato cx3 80fu   SCC (squamous cell carcinoma) 02/26/2018   left upper arm cx3 89fu   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   Left Hand Psoterior (in situ)   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   Right Forearm Posterior (well diff)   SCCA  (squamous cell carcinoma) of skin 08/16/2020   Left Upper Posterior Arm (well diff) treated after biopsy   Seasonal allergies    Skin cancer    Dr [name]   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 " per patient , after changing diet , no reccurrence of symptoms    COLONOSCOPY     INGUINAL HERNIA REPAIR Bilateral 03/12/2017   Procedure: LAPAROSCOPIC BILATERAL INGUINAL HERNIA WITH  MESH;  Surgeon: Clovis Riley, MD;  Location: [facility];  Service: General;  Laterality: Bilateral;   INSERTION OF MESH N/A 03/12/2017   Procedure: INSERTION OF MESH;  Surgeon: Clovis Riley, MD;  Location: [facility];  Service: General;  Laterality: N/A;   TRANSURETHRAL RESECTION OF BLADDER TUMOR N/A 07/08/2017   Procedure: CYSTO CLOT EVACUATION AND FULGRATION;  Surgeon: Irine Seal, MD;  Location: [facility];  Service: Urology;  Laterality: N/A;   XI ROBOTIC ASSISTED SIMPLE PROSTATECTOMY N/A 09/19/2017   Procedure: XI ROBOTIC ASSISTED SIMPLE PROSTATECTOMY;  Surgeon: Cleon Gustin, MD;  Location: [facility];  Service: Urology;  Laterality: N/A;    Social History   Socioeconomic History   Marital status: Married    Spouse name: Not on file   Number of children: Not on file   Years of education: Not on file   Highest education level: Not on file  Occupational History   Occupation: Professor  Tobacco Use   Smoking status: Never   Smokeless tobacco: Former  Scientific laboratory technician Use: Never used  Substance and Sexual Activity   Alcohol use: Yes    Alcohol/week: 0.0 standard drinks    Comment: 2 glasses of red wine nightly   Drug use:  No   Sexual activity: Yes  Other Topics Concern   Not on file  Social History Narrative   Married. Education: Other. Exercise: Bike/light weights everyday for 45-60 minutes.   Social Determinants of Health   Financial Resource Strain: Not on file  Food Insecurity: Not on file  Transportation Needs: Not on file  Physical Activity: Not on file  Stress: Not on file  Social Connections: Not on file    Family History  Problem Relation Age of Onset   Heart disease Mother    Heart disease Maternal Grandfather    Heart disease Father    Heart disease Sister    Colon cancer Neg Hx     Health Maintenance  Topic Date Due   COLONOSCOPY (Pts 45-35yrs Insurance coverage will need to be confirmed)  08/08/2018   INFLUENZA VACCINE  10/24/2020   COVID-19 Vaccine (5 - Booster for Moderna series) 11/17/2020   TETANUS/TDAP  05/02/2026   Hepatitis C Screening  Completed   PNA vac Low Risk Adult  Completed   Zoster Vaccines- Shingrix  Completed   HPV VACCINES  Aged Out    Depression screen [facility] 2/9 09/07/2020 09/01/2019 05/09/2018 05/02/2016 04/27/2015  Decreased Interest 0 0 0 0 0  Down, Depressed, Hopeless 0 0 0 0 0  PHQ - 2 Score 0 0 0 0 0  Altered sleeping - 1 0 - -  Tired, decreased energy - 0 0 - -  Change in appetite - 0 0 - -  Feeling bad or failure about yourself  - 0 0 - -  Trouble concentrating - 0 0 - -  Moving slowly or fidgety/restless - 0 0 - -  Suicidal thoughts - 0 0 - -  PHQ-9 Score - 1 0 - -  Difficult doing work/chores - Not difficult at all Not difficult at all - -    Current Meds  Medication Sig   albuterol (VENTOLIN HFA) 108 (90 Base) MCG/ACT inhaler Inhale 2 puffs into the lungs every 6 (six) hours as needed for wheezing.   fluticasone (FLONASE) 50 MCG/ACT nasal spray Place 2 sprays into both nostrils daily as needed for allergies or rhinitis.   Fluticasone-Salmeterol (ADVAIR DISKUS) 100-50 MCG/DOSE AEPB INHALE 1 PUFF INTO THE LUNGS 2 TIMES DAILY   triazolam  (HALCION) 0.25 MG tablet TAKE ONE TABLET BY MOUTH EVERY NIGHT AT BEDTIME AS NEEDED FOR SLEEP   Fall Risk  09/07/2020 09/01/2019 05/09/2018 05/02/2016 04/27/2015  Falls in the past year? 0 0 0 No No  Number falls in past yr: 0 0 0 - -  Injury with Fall? 0 0 0 - -  Risk for fall due to : No Fall Risks - Impaired balance/gait - -  Follow up Falls evaluation completed - Falls evaluation completed - -   EKG: Sinus bradycardia with rate of 58. Otherwise normal.    ----------------------------------------------------------------------------------------------------------------------------------------------------------------------------------------------------------------- Physical Exam BP 116/64 (BP Location: Left Arm, Patient Position: Sitting, Cuff Size: Normal)   Pulse 65   Temp (!) 97.5 F (36.4 C)   Ht 5\' 8"  (1.727 m)   Wt 166 lb 14.4 oz (75.7 kg)   SpO2 95%   BMI 25.38 kg/m   Care Team: Luetta Nutting, Perla Tafeen-Dermatology  Cognitive Screening:    Mini-Cog - 09/11/20 2147     Normal clock drawing test? yes    How many words correct? 3            Functional Status Survey: Is the patient deaf or have difficulty hearing?: No Does the patient have difficulty seeing, even when wearing glasses/contacts?: No Does the patient have difficulty concentrating, remembering, or making decisions?: No Does the patient have difficulty walking or climbing stairs?: No Does the patient have difficulty dressing or bathing?: No Does the patient have difficulty doing errands alone such as visiting a doctor's office or shopping?: No   ------------------------------------------------------------------------------------------------------------------------------------------------------------------------------------------------------------------ Assessment and Plan  Counseling provided on upcoming health maintenance items as listed below:  Health Maintenance Due  Topic Date Due    COLONOSCOPY (Pts 45-15yrs Insurance coverage will need to be confirmed)  08/08/2018   Orders Placed This Encounter  Procedures   DG Knee Complete 4 Views Right    Standing Status:   Future    Number of Occurrences:   1    Standing Expiration Date:   09/07/2021    Order Specific Question:   Reason for Exam (SYMPTOM  OR DIAGNOSIS REQUIRED)    Answer:   Right knee pain    Order Specific Question:   Preferred imaging location?    Answer:   [facility]   DG Knee Complete 4 Views Left    Standing Status:   Future    Number of Occurrences:   1    Standing Expiration Date:   09/07/2021    Order Specific Question:   Reason for Exam (SYMPTOM  OR DIAGNOSIS REQUIRED)    Answer:   Left knee pain    Order Specific Question:   Preferred imaging location?    Answer:   [facility]   COMPLETE METABOLIC PANEL WITH GFR   PSA   Lipid Panel w/reflex Direct LDL   Ambulatory referral to Gastroenterology    Referral Priority:   Routine    Referral Type:   Consultation    Referral Reason:   Specialty Services Required    Referred to Provider:   Gatha Mayer, MD    Number of Visits Requested:   1   EKG 12-Lead     Advanced Directives Reviewed and/or information given

## 2020-09-08 LAB — COMPLETE METABOLIC PANEL WITH GFR
AG Ratio: 1.8 (calc) (ref 1.0–2.5)
ALT: 9 U/L (ref 9–46)
AST: 12 U/L (ref 10–35)
Albumin: 4.2 g/dL (ref 3.6–5.1)
Alkaline phosphatase (APISO): 46 U/L (ref 35–144)
BUN: 21 mg/dL (ref 7–25)
CO2: 26 mmol/L (ref 20–32)
Calcium: 9.2 mg/dL (ref 8.6–10.3)
Chloride: 104 mmol/L (ref 98–110)
Creat: 0.92 mg/dL (ref 0.70–1.18)
GFR, Est African American: 95 mL/min/{1.73_m2} (ref >=60)
GFR, Est Non African American: 82 mL/min/{1.73_m2} (ref >=60)
Globulin: 2.4 g/dL (calc) (ref 1.9–3.7)
Glucose, Bld: 83 mg/dL (ref 65–99)
Potassium: 4.8 mmol/L (ref 3.5–5.3)
Sodium: 138 mmol/L (ref 135–146)
Total Bilirubin: 0.5 mg/dL (ref 0.2–1.2)
Total Protein: 6.6 g/dL (ref 6.1–8.1)

## 2020-09-08 LAB — LIPID PANEL W/REFLEX DIRECT LDL
Cholesterol: 208 mg/dL — ABNORMAL HIGH (ref <200)
HDL: 84 mg/dL (ref >=40)
LDL Cholesterol (Calc): 107 mg/dL (calc) — ABNORMAL HIGH
Non-HDL Cholesterol (Calc): 124 mg/dL (calc) (ref <130)
Total CHOL/HDL Ratio: 2.5 (calc) (ref <5.0)
Triglycerides: 78 mg/dL (ref <150)

## 2020-09-08 LAB — PSA: PSA: 0.15 ng/mL (ref <=4.00)

## 2020-09-11 DIAGNOSIS — G8929 Other chronic pain: Secondary | ICD-10-CM | POA: Insufficient documentation

## 2020-09-11 NOTE — Assessment & Plan Note (Signed)
Update PSA 

## 2020-09-11 NOTE — Assessment & Plan Note (Signed)
Xrays of knees ordered.

## 2020-09-11 NOTE — Assessment & Plan Note (Signed)
Check lipid and CMP today.

## 2020-09-11 NOTE — Progress Notes (Signed)
Jonathon Taylor - 75 y.o. male MRN [id number]  Date of birth: [birth year]  Subjective Chief Complaint  Patient presents with   Medicare Wellness    HPI Jonathon Taylor is a 75 y.o. male here today for AWV however he has other problems he would like to address as well.  He has complaint of bilateral medial knee pain.  Denies injury.  No swelling of the knee.  He has not tried anything for treatment so far.   Also needs updated labs for chronic problems including elevated cholesterol and BPH.    ROS:  A comprehensive ROS was completed and negative except as noted per HPI  No Known Allergies  Past Medical History:  Diagnosis Date   Anemia    ON IRON FOR PRIOR ANEMIA R/T BLEEDING BEFORE TURBT    Basal cell carcinoma 05/23/1992   LEFT UPPER BACK NEAR SCAR   BCC (basal cell carcinoma of skin) 11/22/1993   BEHIND LEFT EAR tx EFUDEX   BCC (basal cell carcinoma of skin) 03/11/2003   RIGHT SHOULDER TX WITH BX   BCC (basal cell carcinoma of skin) 03/11/2003   LEFT SHOULDER TX WITH BX   BCC (basal cell carcinoma of skin) 11/24/2013   right forearm tx picato   BCC (basal cell carcinoma of skin) 04/19/2014   right shoulder inferior tx with bx   BCC (basal cell carcinoma of skin) 02/26/2018   right forearm tx cx3 61fu   BCC (basal cell carcinoma of skin)    GERD (gastroesophageal reflux disease)    H/O cardiac catheterization 2004   negative;Dr Tollie Eth   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   Left Parotid Ear/Neck   Personal history of colonic polyps 08/12/2015   Prostate enlargement    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   left temple tx cx3 80fu   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   right shoulder front tx with bx   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   top right shoulder tx with bx   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   below left ear tx with bx   SCC (squamous cell carcinoma) 03/03/2008   left temple tx efudex   SCC (squamous cell  carcinoma) 11/14/2011   upper right arm tx with bx   SCC (squamous cell carcinoma) 11/24/2013   right sideburn picato cx3 4fu   SCC (squamous cell carcinoma) 02/26/2018   left upper arm cx3 4fu   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   Left Hand Psoterior (in situ)   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   Right Forearm Posterior (well diff)   SCCA (squamous cell carcinoma) of skin 08/16/2020   Left Upper Posterior Arm (well diff) treated after biopsy   Seasonal allergies    Skin cancer    Dr [name]   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 " per patient , after changing diet , no reccurrence of symptoms    COLONOSCOPY     INGUINAL HERNIA REPAIR Bilateral 03/12/2017  Procedure: LAPAROSCOPIC BILATERAL INGUINAL HERNIA WITH  MESH;  Surgeon: Clovis Riley, MD;  Location: [facility];  Service: General;  Laterality: Bilateral;   INSERTION OF MESH N/A 03/12/2017   Procedure: INSERTION OF MESH;  Surgeon: Clovis Riley, MD;  Location: [facility];  Service: General;  Laterality: N/A;   TRANSURETHRAL RESECTION OF BLADDER TUMOR N/A 07/08/2017   Procedure: CYSTO CLOT EVACUATION AND FULGRATION;  Surgeon: Irine Seal, MD;  Location: [facility];  Service: Urology;  Laterality: N/A;   XI ROBOTIC ASSISTED SIMPLE PROSTATECTOMY N/A 09/19/2017   Procedure: XI ROBOTIC ASSISTED SIMPLE PROSTATECTOMY;  Surgeon: Cleon Gustin, MD;  Location: [facility];  Service: Urology;  Laterality: N/A;    Social History   Socioeconomic History   Marital status: Married    Spouse name: Not on file   Number of children:  Not on file   Years of education: Not on file   Highest education level: Not on file  Occupational History   Occupation: Professor  Tobacco Use   Smoking status: Never   Smokeless tobacco: Former  Scientific laboratory technician Use: Never used  Substance and Sexual Activity   Alcohol use: Yes    Alcohol/week: 0.0 standard drinks    Comment: 2 glasses of red wine nightly   Drug use: No   Sexual activity: Yes  Other Topics Concern   Not on file  Social History Narrative   Married. Education: Other. Exercise: Bike/light weights everyday for 45-60 minutes.   Social Determinants of Health   Financial Resource Strain: Not on file  Food Insecurity: Not on file  Transportation Needs: Not on file  Physical Activity: Not on file  Stress: Not on file  Social Connections: Not on file    Family History  Problem Relation Age of Onset   Heart disease Mother    Heart disease Maternal Grandfather    Heart disease Father    Heart disease Sister    Colon cancer Neg Hx     Health Maintenance  Topic Date Due   COLONOSCOPY (Pts 45-35yrs Insurance coverage will need to be confirmed)  08/08/2018   INFLUENZA VACCINE  10/24/2020   COVID-19 Vaccine (5 - Booster for Moderna series) 11/17/2020   TETANUS/TDAP  05/02/2026   Hepatitis C Screening  Completed   PNA vac Low Risk Adult  Completed   Zoster Vaccines- Shingrix  Completed   HPV VACCINES  Aged Out     ----------------------------------------------------------------------------------------------------------------------------------------------------------------------------------------------------------------- Physical Exam BP 116/64 (BP Location: Left Arm, Patient Position: Sitting, Cuff Size: Normal)   Pulse 65   Temp (!) 97.5 F (36.4 C)   Ht 5\' 8"  (1.727 m)   Wt 166 lb 14.4 oz (75.7 kg)   SpO2 95%   BMI 25.38 kg/m   Physical Exam Constitutional:      Appearance: Normal appearance.  HENT:     Head: Normocephalic and atraumatic.   Cardiovascular:     Rate and Rhythm: Normal rate and regular rhythm.  Musculoskeletal:     Comments: Knee normal to inspection without effusion.  TTP along medial joint lin R> L.  ROM is normal.  Mild crepitus.    Neurological:     Mental Status: He is alert.    ------------------------------------------------------------------------------------------------------------------------------------------------------------------------------------------------------------------- Assessment and Plan  BPH (benign prostatic hyperplasia) Update PSA.   Elevated LDL cholesterol level Check lipid and CMP today.    Chronic pain of both knees Xrays of knees ordered.    No orders of the defined types were placed  in this encounter.   No follow-ups on file.    This visit occurred during the SARS-CoV-2 public health emergency.  Safety protocols were in place, including screening questions prior to the visit, additional usage of staff PPE, and extensive cleaning of exam room while observing appropriate contact time as indicated for disinfecting solutions.

## 2020-09-12 ENCOUNTER — Encounter: Payer: Self-pay | Admitting: Family Medicine

## 2020-10-11 ENCOUNTER — Encounter: Payer: Self-pay | Admitting: Internal Medicine

## 2020-10-18 ENCOUNTER — Ambulatory Visit: Payer: Medicare Other | Admitting: Dermatology

## 2020-10-24 ENCOUNTER — Other Ambulatory Visit: Payer: Self-pay

## 2020-10-24 ENCOUNTER — Ambulatory Visit (AMBULATORY_SURGERY_CENTER): Payer: Self-pay

## 2020-10-24 VITALS — Ht 68.0 in | Wt 167.0 lb

## 2020-10-24 DIAGNOSIS — Z86010 Personal history of colonic polyps: Secondary | ICD-10-CM

## 2020-10-24 NOTE — Progress Notes (Signed)
No allergies to soy or egg Pt is not on blood thinners or diet pills Denies issues with sedation/intubation Denies atrial flutter/fib Denies constipation   Emmi instructions given to pt  Pt is aware of Covid safety and care partner requirements.  

## 2020-10-25 ENCOUNTER — Encounter: Payer: Self-pay | Admitting: Dermatology

## 2020-10-25 NOTE — Progress Notes (Signed)
Follow-Up Visit   Subjective  Jonathon Taylor is a 75 y.o. male who presents for the following: Procedure (Here for [facility] left parotid ear/neck).  Biopsy-proven BCC below left ear and new crust on hand.  Several smaller new crusts. Location:  Duration:  Quality:  Associated Signs/Symptoms: Modifying Factors:  Severity:  Timing: Context:   Objective  Well appearing patient in no apparent distress; mood and affect are within normal limits. Left parotid ear/neck Lesion identified by Dr.Salwa Bai and nurse in room.    Left Dorsal Hand (3), Mid Supratip of Nose, Right Dorsal Hand Hornlike pink crusts 2 to 4 mm  Right Thumb Metacarpophalangeal Joint Pink-brown 1.7 cm crust       All skin waist up examined.   Assessment & Plan    Basal cell carcinoma (BCC) of skin of neck Left parotid ear/neck  Destruction of lesion Complexity: simple   Destruction method: electrodesiccation and curettage   Informed consent: discussed and consent obtained   Timeout:  patient name, date of birth, surgical site, and procedure verified Anesthesia: the lesion was anesthetized in a standard fashion   Anesthetic:  1% lidocaine w/ epinephrine 1-100,000 local infiltration Curettage performed in three different directions: Yes   Electrodesiccation performed over the curetted area: Yes   Curettage cycles:  3 Lesion length (cm):  1.4 Lesion width (cm):  1.4 Margin per side (cm):  0 Final wound size (cm):  1.4 Hemostasis achieved with:  ferric subsulfate Outcome: patient tolerated procedure well with no complications   Additional details:  Wound innoculated with 5 fluorouracil solution.  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 Right Thumb Metacarpophalangeal Joint  Skin / nail biopsy Type of biopsy: tangential   Informed consent: discussed and consent obtained   Timeout: patient name, date of birth, surgical site, and procedure verified   Procedure prep:  Patient was prepped and draped in usual sterile fashion (Non sterile) Prep type:  Chlorhexidine Anesthesia: the lesion was anesthetized in a standard fashion   Anesthetic:  1% lidocaine w/ epinephrine 1-100,000 local infiltration Instrument used: flexible razor blade   Outcome: patient tolerated procedure well   Post-procedure details: wound care instructions given    Destruction of lesion Complexity: simple   Destruction method: electrodesiccation and curettage   Informed consent: discussed and consent obtained   Timeout:  patient name, date of birth, surgical site, and procedure verified Anesthesia: the lesion was anesthetized in a standard fashion   Anesthetic:  1% lidocaine w/ epinephrine 1-100,000 local infiltration Curettage performed in three different directions: Yes   Curettage cycles:  3 Lesion length (cm):  1.5 Lesion width (cm):  1.5 Margin per side (cm):  0 Final wound size (cm):  1.5 Hemostasis achieved with:  aluminum chloride Outcome: patient tolerated procedure well with no complications   Post-procedure details: wound care instructions given    Specimen 2 - Surgical pathology Differential Diagnosis: scc vs bcc  Check Margins: No TXPBX-ED&C  After shave biopsy the base of the lesion was treated with curettage plus cautery.      I, [name], MD, have reviewed all documentation for this visit.  The  documentation on 10/25/20 for the exam, diagnosis, procedures, and orders are all accurate and complete.

## 2020-11-07 ENCOUNTER — Other Ambulatory Visit: Payer: Self-pay

## 2020-11-07 ENCOUNTER — Ambulatory Visit (AMBULATORY_SURGERY_CENTER): Payer: Medicare Other | Admitting: Internal Medicine

## 2020-11-07 ENCOUNTER — Encounter: Payer: Self-pay | Admitting: Internal Medicine

## 2020-11-07 VITALS — BP 129/78 | HR 61 | Temp 97.1°F | Resp 11 | Ht 68.0 in | Wt 167.0 lb

## 2020-11-07 DIAGNOSIS — Z86010 Personal history of colonic polyps: Secondary | ICD-10-CM

## 2020-11-07 MED ORDER — SODIUM CHLORIDE 0.9 % IV SOLN: 500.0000 mL | Freq: Once | INTRAVENOUS | Status: DC

## 2020-11-07 NOTE — Op Note (Signed)
[facility] Patient Name: Jonathon Taylor Procedure Date: 11/07/2020 1:23 PM MRN: [id number] Endoscopist: Gatha Mayer , MD Age: 75 Referring MD:  Date of Birth: [birth year] Gender: Male Account #: [id number] Procedure:                Colonoscopy Indications:              High risk colon cancer surveillance: Personal                            history of sessile serrated colon polyp (10 mm or                            greater in size), Last colonoscopy: 2015 Medicines:                Propofol per Anesthesia, Monitored Anesthesia Care Procedure:                Pre-Anesthesia Assessment:                           - Prior to the procedure, a History and Physical                            was performed, and patient medications and                            allergies were reviewed. The patient's tolerance of                            previous anesthesia was also reviewed. The risks                            and benefits of the procedure and the sedation                            options and risks were discussed with the patient.                            All questions were answered, and informed consent                            was obtained. Prior Anticoagulants: The patient has                            taken no previous anticoagulant or antiplatelet                            agents. ASA Grade Assessment: II - A patient with                            mild systemic disease. After reviewing the risks                            and benefits, the patient was deemed in  satisfactory condition to undergo the procedure.                           After obtaining informed consent, the colonoscope                            was passed under direct vision. Throughout the                            procedure, the patient's blood pressure, pulse, and                            oxygen saturations were monitored continuously. The                             Olympus CF-HQ190L (Serial# [id number]) Colonoscope was                            introduced through the anus and advanced to the the                            cecum, identified by appendiceal orifice and                            ileocecal valve. The colonoscopy was performed                            without difficulty. The patient tolerated the                            procedure well. The quality of the bowel                            preparation was good. The ileocecal valve,                            appendiceal orifice, and rectum were photographed.                            The bowel preparation used was Miralax via split                            dose instruction. Scope In: 1:31:49 PM Scope Out: 1:43:22 PM Scope Withdrawal Time: 0 hours 8 minutes 45 seconds  Total Procedure Duration: 0 hours 11 minutes 33 seconds  Findings:                 The perianal and digital rectal examinations were                            normal. Pertinent negatives include normal prostate                            (size, shape, and consistency).  Many small and large-mouthed diverticula were found                            in the sigmoid colon and descending colon.                           The exam was otherwise without abnormality on                            direct and retroflexion views. Complications:            No immediate complications. Estimated Blood Loss:     Estimated blood loss: none. Impression:               - Severe diverticulosis in the sigmoid colon and in                            the descending colon.                           - The examination was otherwise normal on direct                            and retroflexion views.                           - No specimens collected. Recommendation:           - Patient has a contact number available for                            emergencies. The signs and symptoms of potential                             delayed complications were discussed with the                            patient. Return to normal activities tomorrow.                            Written discharge instructions were provided to the                            patient.                           - Resume previous diet.                           - Continue present medications.                           - No repeat colonoscopy due to age and the absence                            of colonic polyps. Gatha Mayer, MD 11/07/2020 1:49:51 PM This report has been signed electronically.

## 2020-11-07 NOTE — Progress Notes (Signed)
[facility] History and Physical   Primary Care Physician:  Luetta Nutting, DO   Reason for Procedure:   Hx colon polyps  Plan:    colonoscopy     HPI: Jonathon Taylor is a 75 y.o. male w/ hx adenomatous colonic polyps here for surveillance colonoscopy.   Past Medical History:  Diagnosis Date   Anemia    ON IRON FOR PRIOR ANEMIA R/T BLEEDING BEFORE TURBT    Basal cell carcinoma 05/23/1992   LEFT UPPER BACK NEAR SCAR   BCC (basal cell carcinoma of skin) 11/22/1993   BEHIND LEFT EAR tx EFUDEX   BCC (basal cell carcinoma of skin) 03/11/2003   RIGHT SHOULDER TX WITH BX   BCC (basal cell carcinoma of skin) 03/11/2003   LEFT SHOULDER TX WITH BX   BCC (basal cell carcinoma of skin) 11/24/2013   right forearm tx picato   BCC (basal cell carcinoma of skin) 04/19/2014   right shoulder inferior tx with bx   BCC (basal cell carcinoma of skin) 02/26/2018   right forearm tx cx3 344f   BCC (basal cell carcinoma of skin)    GERD (gastroesophageal reflux disease)    H/O cardiac catheterization 2004   negative;Dr STollie Eth  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   Left Parotid Ear/Neck   Personal history of colonic polyps 08/12/2015   Prostate enlargement    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   left temple tx cx3 563f  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   right shoulder front tx with bx   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   top right shoulder tx with bx   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   below left ear tx with bx   SCC (squamous cell carcinoma) 03/03/2008   left temple tx efudex   SCC (squamous cell carcinoma) 11/14/2011   upper right arm tx with bx   SCC (squamous cell carcinoma) 11/24/2013   right sideburn picato cx3 [id number] SCC (squamous cell carcinoma) 02/26/2018   left upper arm cx3 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   Left Hand Psoterior (in situ)   SCCA (squamous cell carcinoma) of skin  10/13/2019   Right Forearm Posterior (well diff)   SCCA (squamous cell carcinoma) of skin 08/16/2020   Left Upper Posterior Arm (well diff) treated after biopsy   Seasonal allergies    Skin cancer    Dr StuaLavonna Monarchquamous cell carcinoma of skin 11/22/1993   right temple tx efudex    Past Surgical History:  Procedure Laterality Date   arthroscopic knee surgery Left 1995   BASAL CELL CARCINOMA EXCISION     2000-2017   CARDIAC CATHETERIZATION  02/27/2003   per patient " i had spot on symptoms of a heart attack and a specialist, dr spenTollie Ethwas brought in and after i had  the cath ; they said it was bad reflux . " per patient , after changing diet , no reccurrence of symptoms    COLONOSCOPY     INGUINAL HERNIA REPAIR Bilateral 03/12/2017   Procedure: LAPAROSCOPIC BILATERAL INGUINAL HERNIA WITH  MESH;  Surgeon: ConnClovis Riley;  Location: [facility];  Service: General;  Laterality: Bilateral;   INSERTION OF MESH N/A 03/12/2017   Procedure: INSERTION OF MESH;  Surgeon: ConnClovis Riley;  Location: [facility];  Service: General;  Laterality: N/A;   TRANSURETHRAL RESECTION OF BLADDER TUMOR N/A 07/08/2017  Procedure: CYSTO CLOT EVACUATION AND FULGRATION;  Surgeon: Irine Seal, MD;  Location: [facility];  Service: Urology;  Laterality: N/A;   XI ROBOTIC ASSISTED SIMPLE PROSTATECTOMY N/A 09/19/2017   Procedure: XI ROBOTIC ASSISTED SIMPLE PROSTATECTOMY;  Surgeon: Cleon Gustin, MD;  Location: [facility];  Service: Urology;  Laterality: N/A;    Prior to Admission medications   Medication Sig Start Date End Date Taking? Authorizing Provider  triazolam (HALCION) 0.25 MG tablet TAKE ONE TABLET BY MOUTH EVERY NIGHT AT BEDTIME AS NEEDED FOR SLEEP 07/05/20  Yes Luetta Nutting, DO  albuterol (VENTOLIN HFA) 108 (90 Base) MCG/ACT inhaler Inhale 2 puffs into the lungs every 6 (six) hours as needed for wheezing. Patient not taking: No sig reported 09/01/19   Luetta Nutting, DO  fluticasone  (FLONASE) 50 MCG/ACT nasal spray Place 2 sprays into both nostrils daily as needed for allergies or rhinitis. Patient not taking: No sig reported 02/16/20   Emeterio Reeve, DO  Fluticasone-Salmeterol (ADVAIR DISKUS) 100-50 MCG/DOSE AEPB INHALE 1 PUFF INTO THE LUNGS 2 TIMES DAILY Patient not taking: No sig reported 09/01/19   Luetta Nutting, DO    Current Outpatient Medications  Medication Sig Dispense Refill   triazolam (HALCION) 0.25 MG tablet TAKE ONE TABLET BY MOUTH EVERY NIGHT AT BEDTIME AS NEEDED FOR SLEEP 30 tablet 2   albuterol (VENTOLIN HFA) 108 (90 Base) MCG/ACT inhaler Inhale 2 puffs into the lungs every 6 (six) hours as needed for wheezing. (Patient not taking: No sig reported) 8 g 6   fluticasone (FLONASE) 50 MCG/ACT nasal spray Place 2 sprays into both nostrils daily as needed for allergies or rhinitis. (Patient not taking: No sig reported) 48 mL 3   Fluticasone-Salmeterol (ADVAIR DISKUS) 100-50 MCG/DOSE AEPB INHALE 1 PUFF INTO THE LUNGS 2 TIMES DAILY (Patient not taking: No sig reported) 60 each 10   Current Facility-Administered Medications  Medication Dose Route Frequency Provider Last Rate Last Admin   0.9 %  sodium chloride infusion  500 mL Intravenous Once Gatha Mayer, MD        Allergies as of 11/07/2020   (No Known Allergies)    Family History  Problem Relation Age of Onset   Heart disease Mother    Heart disease Father    Heart disease Sister    Heart disease Maternal Grandfather    Colon cancer Neg Hx    Colon polyps Neg Hx    Esophageal cancer Neg Hx    Rectal cancer Neg Hx    Stomach cancer Neg Hx     Social History   Socioeconomic History   Marital status: Married    Spouse name: Not on file   Number of children: Not on file   Years of education: Not on file   Highest education level: Not on file  Occupational History   Occupation: Professor  Tobacco Use   Smoking status: Never   Smokeless tobacco: Former  Scientific laboratory technician Use: Never  used  Substance and Sexual Activity   Alcohol use: Yes    Alcohol/week: 0.0 standard drinks    Comment: 2 glasses of red wine nightly   Drug use: No   Sexual activity: Yes  Other Topics Concern   Not on file  Social History Narrative   Married. Education: Other. Exercise: Bike/light weights everyday for 45-60 minutes.   Social Determinants of Health   Financial Resource Strain: Not on file  Food Insecurity: Not on file  Transportation Needs: Not on file  Physical Activity: Not on file  Stress: Not on file  Social Connections: Not on file  Intimate Partner Violence: Not on file    Review of Systems:  All other review of systems negative except as mentioned in the HPI.  Physical Exam: Vital signs BP (!) 144/81   Pulse (!) 53   Temp (!) 97.1 F (36.2 C)   Ht '5\' 8"'$  (1.727 m)   Wt 167 lb (75.8 kg)   SpO2 97%   BMI 25.39 kg/m   General:   Alert,  Well-developed, well-nourished, pleasant and cooperative in NAD Lungs:  Clear throughout to auscultation.   Heart:  Regular rate and rhythm; no murmurs, clicks, rubs,  or gallops. Abdomen:  Soft, nontender and nondistended. Normal bowel sounds.   Neuro/Psych:  Alert and cooperative. Normal mood and affect. A and O x 3   '@Zaniel Marineau'$  Simonne Maffucci, MD, [facility] [phone] (pager) 11/07/2020 1:22 PM@

## 2020-11-07 NOTE — Progress Notes (Signed)
VS- Jonathon Taylor  Pt's states no medical or surgical changes since previsit or office visit.  

## 2020-11-07 NOTE — Progress Notes (Signed)
Report to PACU, RN, vss, BBS= Clear.  

## 2020-11-07 NOTE — Patient Instructions (Addendum)
No polyps or cancer seen.  You still have diverticulosis - thickened muscle rings and pouches in the colon wall. Please read the handout about this condition.  I appreciate the opportunity to care for you. Gatha Mayer, MD, [facility] Please read handouts provided. Continue present medications.   YOU HAD AN ENDOSCOPIC PROCEDURE TODAY AT [facility]:   Refer to the procedure report that was given to you for any specific questions about what was found during the examination.  If the procedure report does not answer your questions, please call your gastroenterologist to clarify.  If you requested that your care partner not be given the details of your procedure findings, then the procedure report has been included in a sealed envelope for you to review at your convenience later.  YOU SHOULD EXPECT: Some feelings of bloating in the abdomen. Passage of more gas than usual.  Walking can help get rid of the air that was put into your GI tract during the procedure and reduce the bloating. If you had a lower endoscopy (such as a colonoscopy or flexible sigmoidoscopy) you may notice spotting of blood in your stool or on the toilet paper. If you underwent a bowel prep for your procedure, you may not have a normal bowel movement for a few days.  Please Note:  You might notice some irritation and congestion in your nose or some drainage.  This is from the oxygen used during your procedure.  There is no need for concern and it should clear up in a day or so.  SYMPTOMS TO REPORT IMMEDIATELY:  Following lower endoscopy (colonoscopy or flexible sigmoidoscopy):  Excessive amounts of blood in the stool  Significant tenderness or worsening of abdominal pains  Swelling of the abdomen that is new, acute  Fever of 100F or higher   For urgent or emergent issues, a gastroenterologist can be reached at any hour by calling [phone]. Do not use MyChart messaging for urgent concerns.    DIET:   We do recommend a small meal at first, but then you may proceed to your regular diet.  Drink plenty of fluids but you should avoid alcoholic beverages for 24 hours.  ACTIVITY:  You should plan to take it easy for the rest of today and you should NOT DRIVE or use heavy machinery until tomorrow (because of the sedation medicines used during the test).    FOLLOW UP: Our staff will call the number listed on your records 48-72 hours following your procedure to check on you and address any questions or concerns that you may have regarding the information given to you following your procedure. If we do not reach you, we will leave a message.  We will attempt to reach you two times.  During this call, we will ask if you have developed any symptoms of COVID 19. If you develop any symptoms (ie: fever, flu-like symptoms, shortness of breath, cough etc.) before then, please call [phone].  If you test positive for Covid 19 in the 2 weeks post procedure, please call and report this information to [place].    If any biopsies were taken you will be contacted by phone or by letter within the next 1-3 weeks.  Please call us at [phone] if you have not heard about the biopsies in 3 weeks.    SIGNATURES/CONFIDENTIALITY: You and/or your care partner have signed paperwork which will be entered into your electronic medical record.  These signatures attest to the fact that  that the information above on your After Visit Summary has been reviewed and is understood.  Full responsibility of the confidentiality of this discharge information lies with you and/or your care-partner.

## 2020-11-09 ENCOUNTER — Telehealth: Payer: Self-pay

## 2020-11-09 NOTE — Telephone Encounter (Signed)
  Follow up Call-  Call back number 11/07/2020  Post procedure Call Back phone  # [phone]  Permission to leave phone message Yes  Some recent data might be hidden     Patient questions:  Do you have a fever, pain , or abdominal swelling? No. Pain Score  0 *  Have you tolerated food without any problems? Yes.    Have you been able to return to your normal activities? Yes.    Do you have any questions about your discharge instructions: Diet   No. Medications  No. Follow up visit  No.  Do you have questions or concerns about your Care? No.  Actions: * If pain score is 4 or above: No action needed, pain <4.

## 2020-11-09 NOTE — Telephone Encounter (Signed)
First attempt follow up call to pt, no answer. 

## 2021-01-16 ENCOUNTER — Ambulatory Visit (INDEPENDENT_AMBULATORY_CARE_PROVIDER_SITE_OTHER): Payer: Medicare Other | Admitting: Dermatology

## 2021-01-16 ENCOUNTER — Encounter: Payer: Self-pay | Admitting: Dermatology

## 2021-01-16 ENCOUNTER — Other Ambulatory Visit: Payer: Self-pay

## 2021-01-16 DIAGNOSIS — Z85828 Personal history of other malignant neoplasm of skin: Secondary | ICD-10-CM | POA: Diagnosis not present

## 2021-01-16 DIAGNOSIS — L738 Other specified follicular disorders: Secondary | ICD-10-CM

## 2021-01-16 DIAGNOSIS — D1801 Hemangioma of skin and subcutaneous tissue: Secondary | ICD-10-CM

## 2021-01-16 DIAGNOSIS — D485 Neoplasm of uncertain behavior of skin: Secondary | ICD-10-CM

## 2021-01-16 DIAGNOSIS — C44629 Squamous cell carcinoma of skin of left upper limb, including shoulder: Secondary | ICD-10-CM

## 2021-01-16 NOTE — Patient Instructions (Signed)

## 2021-01-19 ENCOUNTER — Telehealth: Payer: Self-pay

## 2021-01-19 NOTE — Telephone Encounter (Signed)
Phone call to patient with his pathology results. Patient aware of results.  

## 2021-01-19 NOTE — Telephone Encounter (Signed)
-----   Message from [name], MD sent at 01/19/2021  5:52 AM EDT ----- Schedule surgery with Dr. Darene Lamer

## 2021-01-30 ENCOUNTER — Encounter: Payer: Self-pay | Admitting: Dermatology

## 2021-01-30 NOTE — Progress Notes (Signed)
   Follow-Up Visit   Subjective  Jonathon Taylor is a 75 y.o. male who presents for the following: Follow-up (Mohs follow up left post ear).  New crust on hand, check other areas including site of Mohs surgery. Location:  Duration:  Quality:  Associated Signs/Symptoms: Modifying Factors:  Severity:  Timing: Context:   Objective  Well appearing patient in no apparent distress; mood and affect are within normal limits. Right Forehead 2 mm flesh-colored delled papule; dermoscopy compatible  Torso - Posterior (Back) Multiple 2 mm smooth red dermal papules  Left Hand - Posterior 34mm volcano like nodule compatible with SCCA     Left Mid Helix Status post Mohs surgery, no sign residual    All skin waist up examined.   Assessment & Plan    Sebaceous hyperplasia of face Right Forehead  Told of resemblance to early [facility] so if there is growth or bleeding return for biopsy  Cherry angioma Torso - Posterior (Back)  No intervention necessary  Neoplasm of uncertain behavior of skin Left Hand - Posterior  Skin / nail biopsy Type of biopsy: tangential   Informed consent: discussed and consent obtained   Timeout: patient name, date of birth, surgical site, and procedure verified   Anesthesia: the lesion was anesthetized in a standard fashion   Anesthetic:  1% lidocaine w/ epinephrine 1-100,000 local infiltration Instrument used: flexible razor blade   Hemostasis achieved with: aluminum chloride and electrodesiccation   Outcome: patient tolerated procedure well   Post-procedure details: wound care instructions given    Specimen 1 - Surgical pathology Differential Diagnosis: bcc scc   Check Margins: No  Personal history of skin cancer Left Mid Helix  Recheck as needed change      I, [name], MD, have reviewed all documentation for this visit.  The documentation on 01/30/21 for the exam, diagnosis, procedures, and orders are all accurate and  complete.

## 2021-02-20 ENCOUNTER — Other Ambulatory Visit: Payer: Self-pay | Admitting: Family Medicine

## 2021-03-02 ENCOUNTER — Encounter: Payer: Medicare Other | Admitting: Dermatology

## 2021-03-16 ENCOUNTER — Other Ambulatory Visit: Payer: Self-pay

## 2021-03-16 ENCOUNTER — Encounter: Payer: Self-pay | Admitting: Dermatology

## 2021-03-16 ENCOUNTER — Ambulatory Visit (INDEPENDENT_AMBULATORY_CARE_PROVIDER_SITE_OTHER): Payer: Medicare Other | Admitting: Dermatology

## 2021-03-16 ENCOUNTER — Encounter: Payer: Medicare Other | Admitting: Dermatology

## 2021-03-16 DIAGNOSIS — C44629 Squamous cell carcinoma of skin of left upper limb, including shoulder: Secondary | ICD-10-CM

## 2021-03-16 DIAGNOSIS — C4492 Squamous cell carcinoma of skin, unspecified: Secondary | ICD-10-CM

## 2021-03-16 NOTE — Patient Instructions (Signed)

## 2021-03-29 ENCOUNTER — Telehealth: Payer: Self-pay

## 2021-03-29 NOTE — Telephone Encounter (Signed)
Pt lvm stating he and spouse have tested COVID +.  Symptoms started 03/25/2021.  Front Dest: Please contact patient to see if he would like an appointment for COVID with 1st available provider. He can also have virtual visit with [facility] providers via website.   Thanks

## 2021-03-31 ENCOUNTER — Telehealth (INDEPENDENT_AMBULATORY_CARE_PROVIDER_SITE_OTHER): Payer: Medicare Other | Admitting: Sports Medicine

## 2021-03-31 ENCOUNTER — Other Ambulatory Visit: Payer: Self-pay

## 2021-03-31 DIAGNOSIS — U071 COVID-19: Secondary | ICD-10-CM

## 2021-03-31 MED ORDER — AZITHROMYCIN 250 MG PO TABS: ORAL_TABLET | ORAL | 0 refills | Status: DC

## 2021-03-31 MED ORDER — PREDNISONE 50 MG PO TABS
50.0000 mg | ORAL_TABLET | Freq: Every day | ORAL | 0 refills | Status: DC
Start: 2021-03-31 — End: 2022-10-31

## 2021-03-31 MED ORDER — NIRMATRELVIR/RITONAVIR (PAXLOVID)TABLET: ORAL_TABLET | ORAL | 0 refills | Status: DC

## 2021-03-31 NOTE — Progress Notes (Signed)
° °  Virtual Visit via WebEx/MyChart   I connected with  Jonathon Taylor  on 03/31/21 via WebEx/MyChart/Doximity Video and verified that I am speaking with the correct person using two identifiers.   I discussed the limitations, risks, security and privacy concerns of performing an evaluation and management service by WebEx/MyChart/Doximity Video, including the higher likelihood of inaccurate diagnosis and treatment, and the availability of in person appointments.  We also discussed the likely need of an additional face to face encounter for complete and high quality delivery of care.  I also discussed with the patient that there may be a patient responsible charge related to this service. The patient expressed understanding and wishes to proceed.  Provider location is in medical facility. Patient location is at their home, different from provider location. People involved in care of the patient during this telehealth encounter were myself, my nurse/medical assistant, and my front office/scheduling team member.  Review of Systems: No fevers, chills, night sweats, weight loss, chest pain, or shortness of breath.   Objective Findings:    General: Speaking full sentences, no audible heavy breathing.  Sounds alert and appropriately interactive.  Appears well.  Face symmetric.  Extraocular movements intact.  Pupils equal and round.  No nasal flaring or accessory muscle use visualized.  Independent interpretation of tests performed by another provider:   None.  Brief History, Exam, Impression, and Recommendations:    COVID-19 This is a pleasant 76 year old male with history of asthma/reactive airways, he started to have COVID symptoms a few days ago, positive test a day or 2 ago. He is speaking full sentences, no respiratory distress on the phone, feels okay, mostly cough, runny nose. Considering history and risk factors we will do prednisone, azithromycin, Paxlovid. Return to see [place] if not  significantly better by the end of next week. We discussed isolation and quarantine protocol.   I discussed the above assessment and treatment plan with the patient. The patient was provided an opportunity to ask questions and all were answered. The patient agreed with the plan and demonstrated an understanding of the instructions.   The patient was advised to call back or seek an in-person evaluation if the symptoms worsen or if the condition fails to improve as anticipated.   I provided 30 minutes of face to face and non-face-to-face time during this encounter date, time was needed to gather information, review chart, records, communicate/coordinate with staff remotely, as well as complete documentation.   ___________________________________________ Gwen Her. Dianah Field, M.D., ABFM., CAQSM. Primary Care and [place] Instructor of [employer] [facility] [employer]

## 2021-03-31 NOTE — Assessment & Plan Note (Signed)
This is a pleasant 76 year old male with history of asthma/reactive airways, he started to have COVID symptoms a few days ago, positive test a day or 2 ago. He is speaking full sentences, no respiratory distress on the phone, feels okay, mostly cough, runny nose. Considering history and risk factors we will do prednisone, azithromycin, Paxlovid. Return to see [place] if not significantly better by the end of next week. We discussed isolation and quarantine protocol.

## 2021-04-01 ENCOUNTER — Encounter: Payer: Self-pay | Admitting: Sports Medicine

## 2021-04-16 ENCOUNTER — Encounter: Payer: Self-pay | Admitting: Dermatology

## 2021-04-16 NOTE — Progress Notes (Signed)
° °  Follow-Up Visit   Subjective  Jonathon Taylor is a 76 y.o. male who presents for the following: Procedure (Left hand - posterior well diff scc).  SCCA left hand Location:  Duration:  Quality:  Associated Signs/Symptoms: Modifying Factors:  Severity:  Timing: Context:   Objective  Well appearing patient in no apparent distress; mood and affect are within normal limits. Left Hand - Posterior Biopsy site identified by patient, nurse, and me    A focused examination was performed including head, neck, hands. Relevant physical exam findings are noted in the Assessment and Plan.   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  Destruction of lesion Complexity: simple   Destruction method: electrodesiccation and curettage   Informed consent: discussed and consent obtained   Timeout:  patient name, date of birth, surgical site, and procedure verified Anesthesia: the lesion was anesthetized in a standard fashion   Anesthetic:  1% lidocaine w/ epinephrine 1-100,000 local infiltration Curettage performed in three different directions: Yes   Curettage cycles:  3 Lesion length (cm):  0.8 Lesion width (cm):  0.8 Margin per side (cm):  0 Final wound size (cm):  0.8 Hemostasis achieved with:  ferric subsulfate Outcome: patient tolerated procedure well with no complications   Post-procedure details: sterile dressing applied and wound care instructions given   Dressing type: bandage and petrolatum   Additional details:  Wound inoculated with 5% Fluorouracil.         I, [name], MD, have reviewed all documentation for this visit.  The documentation on 04/16/21 for the exam, diagnosis, procedures, and orders are all accurate and complete.

## 2021-05-10 ENCOUNTER — Ambulatory Visit: Payer: Medicare Other | Admitting: Dermatology

## 2021-07-03 ENCOUNTER — Ambulatory Visit (INDEPENDENT_AMBULATORY_CARE_PROVIDER_SITE_OTHER): Payer: Medicare Other | Admitting: Dermatology

## 2021-07-03 DIAGNOSIS — D485 Neoplasm of uncertain behavior of skin: Secondary | ICD-10-CM

## 2021-07-03 DIAGNOSIS — Z85828 Personal history of other malignant neoplasm of skin: Secondary | ICD-10-CM | POA: Diagnosis not present

## 2021-07-03 DIAGNOSIS — L570 Actinic keratosis: Secondary | ICD-10-CM

## 2021-07-03 DIAGNOSIS — R202 Paresthesia of skin: Secondary | ICD-10-CM | POA: Diagnosis not present

## 2021-07-03 DIAGNOSIS — Z8589 Personal history of malignant neoplasm of other organs and systems: Secondary | ICD-10-CM

## 2021-07-03 DIAGNOSIS — Z1283 Encounter for screening for malignant neoplasm of skin: Secondary | ICD-10-CM

## 2021-07-03 DIAGNOSIS — C44321 Squamous cell carcinoma of skin of nose: Secondary | ICD-10-CM | POA: Diagnosis not present

## 2021-07-03 DIAGNOSIS — C44311 Basal cell carcinoma of skin of nose: Secondary | ICD-10-CM | POA: Diagnosis not present

## 2021-07-03 DIAGNOSIS — M549 Dorsalgia, unspecified: Secondary | ICD-10-CM

## 2021-07-03 NOTE — Patient Instructions (Addendum)
Otc pramoxine lotion ? ? ?Biopsy, Surgery (Curettage) & Surgery (Excision) Aftercare  ?Instructions ? ?1. Okay to remove bandage in 24 hours ? ?2. Wash area with soap and water ? ?3. Apply Vaseline to area twice daily until healed (Not Neosporin) ? ?4. Okay to cover with a Band-Aid to decrease the chance of infection or prevent irritation from clothing; also it's okay to uncover lesion at home. ? ?5. Suture instructions: return to our office in 7-10 or 10-14 days for a nurse visit for suture removal. Variable healing with sutures, if pain or itching occurs call our office. It's okay to shower or bathe 24 hours after sutures are given. ? ?6. The following risks may occur after a biopsy, curettage or excision: bleeding, scarring, discoloration, recurrence, infection (redness, yellow drainage, pain or swelling). ? ?7. For questions, concerns and results call our office at [facility] before 4pm & Friday before 3pm. Biopsy results will be available in 1 week. ? ?

## 2021-07-10 ENCOUNTER — Telehealth: Payer: Self-pay

## 2021-07-10 NOTE — Telephone Encounter (Signed)
Phone call to patient with his pathology results. Patient aware of results. MOH's referral sent.  ?

## 2021-07-10 NOTE — Telephone Encounter (Signed)
-----   Message from [name], MD sent at 07/08/2021  5:24 AM EDT ----- ?Mohs surgery will give him the highest cure but it may be impossible to do in these locations without closing with a flap or graft.  I do recommend he see Dr. Danielle Dess or Dr. Link Snuffer. ?

## 2021-07-21 ENCOUNTER — Encounter: Payer: Self-pay | Admitting: Dermatology

## 2021-07-21 NOTE — Progress Notes (Signed)
? ?  Follow-Up Visit ?  ?Subjective  ?Jonathon Taylor is a 76 y.o. male who presents for the following: Follow-up (F/u for scc the left hand.  Personal history of bcc and scc. ). ? ?History of skin cancer most recently left hand, crust on nose plus check other spots ?Location:  ?Duration:  ?Quality:  ?Associated Signs/Symptoms: ?Modifying Factors:  ?Severity:  ?Timing: ?Context:  ? ?Objective  ?Well appearing patient in no apparent distress; mood and affect are within normal limits. ?Waist up exam: No atypical pigmented lesions or recurrent nonmelanoma skin cancer.  2 possible new carcinomas will be biopsied. ? 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 ?Left Hand - Posterior ?No sign residual ? ?Left Nasal Sidewall ?Pearly 4 mm papule with tiny central erosion ? ? ? ? ? ? ?Mid Tip of Nose ?Waxy 4 mm crust ? ? ? ? ? ? ? ? ?All skin waist up examined. ? ? ?Assessment & Plan  ? ? ?History of squamous cell carcinoma ?Left Hand - Posterior ? ?Recheck as needed change ? ?Encounter for screening for malignant neoplasm of skin ? ?Annual skin examination ? ?Neoplasm of uncertain behavior of skin (2) ?Left Nasal Sidewall ? ?Skin / nail biopsy ?Type of biopsy: tangential   ?Informed consent: discussed and consent obtained   ?Timeout: patient name, date of birth, surgical site, and procedure verified   ?Anesthesia: the lesion was anesthetized in a standard fashion   ?Anesthetic:  1% lidocaine w/ epinephrine 1-100,000 local infiltration ?Instrument used: flexible razor blade   ?Hemostasis achieved with: aluminum chloride and electrodesiccation   ?Outcome: patient tolerated procedure well   ?Post-procedure details: wound care instructions given   ? ?Specimen 1 - Surgical pathology ?Differential Diagnosis: bcc vs scc ? ?Check Margins: No ? ?Mid Tip of Nose ? ?Skin / nail biopsy ?Type of biopsy: tangential   ?Informed consent: discussed and consent obtained   ?Timeout: patient name, date of birth,  surgical site, and procedure verified   ?Anesthesia: the lesion was anesthetized in a standard fashion   ?Anesthetic:  1% lidocaine w/ epinephrine 1-100,000 local infiltration ?Instrument used: flexible razor blade   ?Hemostasis achieved with: aluminum chloride and electrodesiccation   ?Outcome: patient tolerated procedure well   ?Post-procedure details: wound care instructions given   ? ?Specimen 2 - Surgical pathology ?Differential Diagnosis: bcc vs scc ? ?Check Margins: No ? ?AK (actinic keratosis) (2) ?Left Forehead; Right Forehead ? ?Destruction of lesion - Left Forehead, Right Forehead ?Complexity: simple   ?Destruction method: cryotherapy   ?Informed consent: discussed and consent obtained   ?Timeout:  patient name, date of birth, surgical site, and procedure verified ?Lesion destroyed using liquid nitrogen: Yes   ?Cryotherapy cycles:  3 ?Outcome: patient tolerated procedure well with no complications   ?Post-procedure details: wound care instructions given   ? ?Notalgia paresthetica ? ?Lotion with pramoxine, can use on a as needed basis. ? ? ? ? ? ?I, [name], MD, have reviewed all documentation for this visit.  The documentation on 07/21/21 for the exam, diagnosis, procedures, and orders are all accurate and complete. ?

## 2021-11-15 ENCOUNTER — Encounter: Payer: Self-pay | Admitting: General Practice

## 2022-01-09 ENCOUNTER — Telehealth: Payer: Self-pay | Admitting: Family Medicine

## 2022-01-09 DIAGNOSIS — E7800 Pure hypercholesterolemia, unspecified: Secondary | ICD-10-CM

## 2022-01-09 DIAGNOSIS — Z0000 Encounter for general adult medical examination without abnormal findings: Secondary | ICD-10-CM

## 2022-01-09 DIAGNOSIS — N400 Enlarged prostate without lower urinary tract symptoms: Secondary | ICD-10-CM

## 2022-01-09 NOTE — Telephone Encounter (Signed)
Patient is scheduled for an physical on 02-06-2022 he is asking that blood work be order prior to his appointment.

## 2022-01-10 NOTE — Telephone Encounter (Signed)
Completed.

## 2022-01-11 NOTE — Telephone Encounter (Signed)
Attempted call to patient to let him know that labs have been ordered. Phone rang without answer. Unable to leave a vm message.

## 2022-02-02 ENCOUNTER — Telehealth: Payer: Self-pay

## 2022-02-02 NOTE — Telephone Encounter (Signed)
LVM for patient to call and schedule labs appointment for physical prior to physical appointment date. Jonathon Taylor

## 2022-02-06 ENCOUNTER — Encounter: Payer: Self-pay | Admitting: Family Medicine

## 2022-02-06 ENCOUNTER — Ambulatory Visit (INDEPENDENT_AMBULATORY_CARE_PROVIDER_SITE_OTHER): Payer: Medicare Other | Admitting: Family Medicine

## 2022-02-06 VITALS — BP 112/75 | HR 60 | Ht 68.0 in | Wt 162.0 lb

## 2022-02-06 DIAGNOSIS — Z0000 Encounter for general adult medical examination without abnormal findings: Secondary | ICD-10-CM

## 2022-02-06 DIAGNOSIS — M25562 Pain in left knee: Secondary | ICD-10-CM | POA: Diagnosis not present

## 2022-02-06 DIAGNOSIS — G8929 Other chronic pain: Secondary | ICD-10-CM | POA: Diagnosis not present

## 2022-02-06 DIAGNOSIS — M25561 Pain in right knee: Secondary | ICD-10-CM

## 2022-02-06 LAB — COMPLETE METABOLIC PANEL WITH GFR
AG Ratio: 1.6 (calc) (ref 1.0–2.5)
ALT: 10 U/L (ref 9–46)
AST: 13 U/L (ref 10–35)
Albumin: 3.9 g/dL (ref 3.6–5.1)
Alkaline phosphatase (APISO): 43 U/L (ref 35–144)
BUN: 15 mg/dL (ref 7–25)
CO2: 28 mmol/L (ref 20–32)
Calcium: 8.7 mg/dL (ref 8.6–10.3)
Chloride: 104 mmol/L (ref 98–110)
Creat: 0.8 mg/dL (ref 0.70–1.28)
Globulin: 2.4 g/dL (calc) (ref 1.9–3.7)
Glucose, Bld: 83 mg/dL (ref 65–99)
Potassium: 4.3 mmol/L (ref 3.5–5.3)
Sodium: 139 mmol/L (ref 135–146)
Total Bilirubin: 0.6 mg/dL (ref 0.2–1.2)
Total Protein: 6.3 g/dL (ref 6.1–8.1)
eGFR: 93 mL/min/{1.73_m2} (ref >=60)

## 2022-02-06 LAB — CBC WITH DIFFERENTIAL/PLATELET
Absolute Monocytes: 460 cells/uL (ref 200–950)
Basophils Absolute: 20 cells/uL (ref 0–200)
Basophils Relative: 0.5 %
Eosinophils Absolute: 39 cells/uL (ref 15–500)
Eosinophils Relative: 1 %
HCT: 43.7 % (ref 38.5–50.0)
Hemoglobin: 13.7 g/dL (ref 13.2–17.1)
Lymphs Abs: 1868 cells/uL (ref 850–3900)
MCH: 25.9 pg — ABNORMAL LOW (ref 27.0–33.0)
MCHC: 31.4 g/dL — ABNORMAL LOW (ref 32.0–36.0)
MCV: 82.8 fL (ref 80.0–100.0)
MPV: 10.9 fL (ref 7.5–12.5)
Monocytes Relative: 11.8 %
Neutro Abs: 1513 cells/uL (ref 1500–7800)
Neutrophils Relative %: 38.8 %
Platelets: 251 10*3/uL (ref 140–400)
RBC: 5.28 10*6/uL (ref 4.20–5.80)
RDW: 13.9 % (ref 11.0–15.0)
Total Lymphocyte: 47.9 %
WBC: 3.9 10*3/uL (ref 3.8–10.8)

## 2022-02-06 LAB — LIPID PANEL
Cholesterol: 190 mg/dL (ref <200)
HDL: 84 mg/dL (ref >=40)
LDL Cholesterol (Calc): 94 mg/dL (calc)
Non-HDL Cholesterol (Calc): 106 mg/dL (calc) (ref <130)
Total CHOL/HDL Ratio: 2.3 (calc) (ref <5.0)
Triglycerides: 41 mg/dL (ref <150)

## 2022-02-06 LAB — PSA: PSA: 0.2 ng/mL (ref <=4.00)

## 2022-02-06 NOTE — Assessment & Plan Note (Signed)
Well adult Orders Placed This Encounter  Procedures   Ambulatory referral to Physical Therapy    Referral Priority:   Routine    Referral Type:   Physical Medicine    Referral Reason:   Specialty Services Required    Requested Specialty:   Physical Therapy    Number of Visits Requested:   1  Screenings: Up-to-date Immunizations: Up-to-date Anticipatory guidance/risk factor reduction: Recommendations per AVS.

## 2022-02-06 NOTE — Assessment & Plan Note (Signed)
Arthritic changes of the knees noted on x-rays.  He feels that he has had improvement with gelatin supplement which he is consuming for collagen intake. He May use Voltaren gel over-the-counter as needed as well.

## 2022-02-06 NOTE — Patient Instructions (Signed)
Preventive Care 65 Years and Older, Male Preventive care refers to lifestyle choices and visits with your health care provider that can promote health and wellness. Preventive care visits are also called wellness exams. What can I expect for my preventive care visit? Counseling During your preventive care visit, your health care provider may ask about your: Medical history, including: Past medical problems. Family medical history. History of falls. Current health, including: Emotional well-being. Home life and relationship well-being. Sexual activity. Memory and ability to understand (cognition). Lifestyle, including: Alcohol, nicotine or tobacco, and drug use. Access to firearms. Diet, exercise, and sleep habits. Work and work environment. Sunscreen use. Safety issues such as seatbelt and bike helmet use. Physical exam Your health care provider will check your: Height and weight. These may be used to calculate your BMI (body mass index). BMI is a measurement that tells if you are at a healthy weight. Waist circumference. This measures the distance around your waistline. This measurement also tells if you are at a healthy weight and may help predict your risk of certain diseases, such as type 2 diabetes and high blood pressure. Heart rate and blood pressure. Body temperature. Skin for abnormal spots. What immunizations do I need?  Vaccines are usually given at various ages, according to a schedule. Your health care provider will recommend vaccines for you based on your age, medical history, and lifestyle or other factors, such as travel or where you work. What tests do I need? Screening Your health care provider may recommend screening tests for certain conditions. This may include: Lipid and cholesterol levels. Diabetes screening. This is done by checking your blood sugar (glucose) after you have not eaten for a while (fasting). Hepatitis C test. Hepatitis B test. HIV (human  immunodeficiency virus) test. STI (sexually transmitted infection) testing, if you are at risk. Lung cancer screening. Colorectal cancer screening. Prostate cancer screening. Abdominal aortic aneurysm (AAA) screening. You may need this if you are a current or former smoker. Talk with your health care provider about your test results, treatment options, and if necessary, the need for more tests. Follow these instructions at home: Eating and drinking  Eat a diet that includes fresh fruits and vegetables, whole grains, lean protein, and low-fat dairy products. Limit your intake of foods with high amounts of sugar, saturated fats, and salt. Take vitamin and mineral supplements as recommended by your health care provider. Do not drink alcohol if your health care provider tells you not to drink. If you drink alcohol: Limit how much you have to 0-2 drinks a day. Know how much alcohol is in your drink. In the [place] one drink equals one 12 oz bottle of beer (355 mL), one 5 oz glass of wine (148 mL), or one 1 oz glass of hard liquor (44 mL). Lifestyle Brush your teeth every morning and night with fluoride toothpaste. Floss one time each day. Exercise for at least 30 minutes 5 or more days each week. Do not use any products that contain nicotine or tobacco. These products include cigarettes, chewing tobacco, and vaping devices, such as e-cigarettes. If you need help quitting, ask your health care provider. Do not use drugs. If you are sexually active, practice safe sex. Use a condom or other form of protection to prevent STIs. Take aspirin only as told by your health care provider. Make sure that you understand how much to take and what form to take. Work with your health care provider to find out whether it is safe   and beneficial for you to take aspirin daily. Ask your health care provider if you need to take a cholesterol-lowering medicine (statin). Find healthy ways to manage stress, such  as: Meditation, yoga, or listening to music. Journaling. Talking to a trusted person. Spending time with friends and family. Safety Always wear your seat belt while driving or riding in a vehicle. Do not drive: If you have been drinking alcohol. Do not ride with someone who has been drinking. When you are tired or distracted. While texting. If you have been using any mind-altering substances or drugs. Wear a helmet and other protective equipment during sports activities. If you have firearms in your house, make sure you follow all gun safety procedures. Minimize exposure to UV radiation to reduce your risk of skin cancer. What's next? Visit your health care provider once a year for an annual wellness visit. Ask your health care provider how often you should have your eyes and teeth checked. Stay up to date on all vaccines. This information is not intended to replace advice given to you by your health care provider. Make sure you discuss any questions you have with your health care provider. Document Revised: 09/07/2020 Document Reviewed: 09/07/2020 Elsevier Patient Education  2023 [employer] Inc.  

## 2022-02-06 NOTE — Therapy (Unsigned)
OUTPATIENT PHYSICAL THERAPY LOWER EXTREMITY EVALUATION   Patient Name: Jonathon Taylor MRN: [id number] DOB:11/15/1945, 76 y.o., male Today's Date: 02/06/2022    Past Medical History:  Diagnosis Date   Anemia    ON IRON FOR PRIOR ANEMIA R/T BLEEDING BEFORE TURBT    Basal cell carcinoma 05/23/1992   LEFT UPPER BACK NEAR SCAR   BCC (basal cell carcinoma of skin) 11/22/1993   BEHIND LEFT EAR tx EFUDEX   BCC (basal cell carcinoma of skin) 03/11/2003   RIGHT SHOULDER TX WITH BX   BCC (basal cell carcinoma of skin) 03/11/2003   LEFT SHOULDER TX WITH BX   BCC (basal cell carcinoma of skin) 11/24/2013   right forearm tx picato   BCC (basal cell carcinoma of skin) 04/19/2014   right shoulder inferior tx with bx   BCC (basal cell carcinoma of skin) 02/26/2018   right forearm tx cx3 18f   BCC (basal cell carcinoma of skin)    GERD (gastroesophageal reflux disease)    H/O cardiac catheterization 2004   negative;Dr STollie Eth  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   Left Parotid Ear/Neck   Nodular basal cell carcinoma (BCC) 07/03/2021   Left Nasal Sidewall   Personal history of colonic polyps 08/12/2015   Prostate enlargement    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   left temple tx cx3 561f  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   right shoulder front tx with bx   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   top right shoulder tx with bx   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   below left ear tx with bx   SCC (squamous cell carcinoma) 03/03/2008   left temple tx efudex   SCC (squamous cell carcinoma) 11/14/2011   upper right arm tx with bx   SCC (squamous cell carcinoma) 11/24/2013   right sideburn picato cx3 [id number] SCC (squamous cell carcinoma) 02/26/2018   left upper arm cx3 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   Left Hand Psoterior (in situ)   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   Right Forearm Posterior (well  diff)   SCCA (squamous cell carcinoma) of skin 08/16/2020   Left Upper Posterior Arm (well diff) treated after biopsy   SCCA (squamous cell carcinoma) of skin 01/16/2021   Left Hand - posterior (well diff)   SCCA (squamous cell carcinoma) of skin 07/03/2021   Mid Tip of Nose (well diff with superficial inflitration)   Seasonal allergies    Skin cancer    Dr StuaLavonna Monarchquamous cell carcinoma of skin 11/22/1993   right temple tx efudex   Past Surgical History:  Procedure Laterality Date   arthroscopic knee surgery Left 1995   BASAL CELL CARCINOMA EXCISION     2000-2017   CARDIAC CATHETERIZATION  02/27/2003   per patient " i had spot on symptoms of a heart attack and a specialist, dr spenTollie Ethwas brought in and after i had  the cath ; they said it was bad reflux . " per patient , after changing diet , no reccurrence of symptoms    COLONOSCOPY     INGUINAL HERNIA REPAIR Bilateral 03/12/2017   Procedure: LAPAROSCOPIC BILATERAL INGUINAL HERNIA WITH  MESH;  Surgeon: ConnClovis Riley;  Location: [facility];  Service: General;  Laterality: Bilateral;   INSERTION OF MESH N/A 03/12/2017   Procedure: INSERTION OF MESH;  Surgeon: ConnClovis Riley;  Location: [facility];  Service: General;  Laterality: N/A;   TRANSURETHRAL RESECTION OF BLADDER TUMOR N/A 07/08/2017   Procedure: CYSTO CLOT EVACUATION AND FULGRATION;  Surgeon: Irine Seal, MD;  Location: [facility];  Service: Urology;  Laterality: N/A;   XI ROBOTIC ASSISTED SIMPLE PROSTATECTOMY N/A 09/19/2017   Procedure: XI ROBOTIC ASSISTED SIMPLE PROSTATECTOMY;  Surgeon: Cleon Gustin, MD;  Location: [facility];  Service: Urology;  Laterality: N/A;   Patient Active Problem List   Diagnosis Date Noted   COVID-19 03/31/2021   Chronic pain of both knees 09/11/2020   Well adult exam 09/01/2019   Elevated LDL cholesterol level 10/17/2018   Low calcium levels 04/18/2018   Spondylisthesis 08/29/2015   Personal history of colonic polyps  08/12/2015   Insomnia 01/18/2014   RAD (reactive airway disease) 11/26/2012   Family history of cardiovascular disease 11/26/2012    PCP: Dr Luetta Nutting  REFERRING PROVIDER: Dr Luetta Nutting   REFERRING DIAG: Chronic pain bilat knees   THERAPY DIAG:  No diagnosis found.  Rationale for Evaluation and Treatment: Rehabilitation  ONSET DATE: 03/26/21  SUBJECTIVE:   SUBJECTIVE STATEMENT: Patient reports that he has had bilat knee pain in both knees which he feels is due to heavy gardening. He had xrays of knees and has some degenerative changes noted in both knees. He would like to stretch and strengthen knees. He has been doing some exercises at home. Now taking supplements to help joints and is now noticing less pain.   PERTINENT HISTORY: Bilat knee arthritis; spondylosis lumbar spine.  PAIN:  Are you having pain? Yes: NPRS scale: 2/10 Pain location: Rt > Lt Pain description: aching; sore Aggravating factors: stairs; squatting; walking after being immobile  Relieving factors: movement; rest  PRECAUTIONS: None  WEIGHT BEARING RESTRICTIONS: No  FALLS:  Has patient fallen in last 6 months? No  LIVING ENVIRONMENT: Lives with: lives with their spouse Lives in: House/apartment   OCCUPATION: retired  PLOF: Independent  PATIENT GOALS: learn exercises for knees  NEXT MD VISIT: PRN  OBJECTIVE:   DIAGNOSTIC FINDINGS: 09/07/21 xrays -Lt knee - Slight spurring medially. No appreciable joint space narrowing. No fracture, dislocation, or joint effusion. Rt knee - Osteoarthritic change medially and to a lesser degree in the patellofemoral joint. No fracture, dislocation, or joint effusion.  PATIENT SURVEYS:  FOTO 53  COGNITION: Overall cognitive status: Within functional limits for tasks assessed     SENSATION: WFL  EDEMA:  None   MUSCLE LENGTH: Hamstrings: Right 65 deg; Left 70 deg Thomas test: Right -10 deg; Left -5 deg  POSTURE: knees in varus bilat    PALPATION: Muscular tightness bilat, Rt > Lt, hip flexors; quads; posterior hips; HS   LOWER EXTREMITY ROM:  Active ROM Right eval Left eval  Hip flexion -10 -5  Hip extension WNL WNL  Hip abduction    Hip adduction    Hip internal rotation    Hip external rotation    Knee flexion WNL WNL  Knee extension -16 -5  Ankle dorsiflexion    Ankle plantarflexion    Ankle inversion    Ankle eversion     (Blank rows = not tested)  LOWER EXTREMITY MMT:  LE strength is 5/5 throughout with MMT   FUNCTIONAL TESTS:  SLS - 10+ sec each LE more difficult on Rt   GAIT: Distance walked: 40 Assistive device utilized: None Level of assistance: Complete Independence Comments: ambulates with trunk and hips in some forward flexion; knees flexed; note varus  deformity bilat knees    TODAY'S TREATMENT:                                                                                                                              DATE: 02/07/22 Education re- knee ROM, strength, role of exercise in knee rehab and in fitness   Therapeutic exercise: Supine:  Hamstring stretch w/strap 30 sec x 2   Piriformis stretch travell 30 sec x 2   Hip flexor stretch modified Thomas 30 sec x 2   Transverse abdominal 10 sec x 10   Quad set towel under knee 5 sec x 10   Prone:  Quad stretch w/strap pillow under abdomen 30 sec x 2     PATIENT EDUCATION:  Education details: POC; HEP  Person educated: Patient Education method: Consulting civil engineer, Media planner, Corporate treasurer cues, Verbal cues, and Handouts Education comprehension: verbalized understanding, returned demonstration, verbal cues required, tactile cues required, and needs further education  HOME EXERCISE PROGRAM: Access Code: [email or web address] URL: [email or web address] Date: 02/07/2022 Prepared by: Gillermo Murdoch  Exercises - Hooklying Hamstring Stretch with Strap  - 1 x daily - 7 x weekly - 1 sets - 3 reps - 30 sec  hold - Supine Piriformis  Stretch with Leg Straight  - 1 x daily - 7 x weekly - 1 sets - 3 reps - 30 sec  hold - Hip Flexor Stretch at Edge of Bed  - 1 x daily - 7 x weekly - 1 sets - 3 reps - 30 sec  hold - Prone Quadriceps Stretch with Strap  - 1 x daily - 7 x weekly - 1 sets - 3 reps - 30 sec  hold - Supine Quad Set on Towel Roll  - 1 x daily - 7 x weekly - 2-3 sets - 10 reps - 5 sec  hold - Supine Transversus Abdominis Bracing with Pelvic Floor Contraction  - 2 x daily - 7 x weekly - 1 sets - 10 reps - 10sec  hold  ASSESSMENT:  CLINICAL IMPRESSION: Patient is a 76 y.o. male who was seen today for physical therapy evaluation and treatment for chronic bilateral knee pain. He has poor posture and alignment; abnormal gait pattern; limited ROM bilat knees with greatest limitation in Rt knee extension which is (-)16 degrees; significant tissue tightness through blat LE's contributing to knee pain and limited functional activities. Patient has pain and stiffness for at least 24 hours after he has been doing more vigorous physical activity. Patient will benefit from PT to address problems identified.    OBJECTIVE IMPAIRMENTS: Abnormal gait, decreased activity tolerance, decreased balance, decreased mobility, difficulty walking, decreased ROM, decreased strength, impaired flexibility, improper body mechanics, postural dysfunction, and pain.   ACTIVITY LIMITATIONS: carrying, lifting, bending, standing, squatting, stairs, transfers, bed mobility, and locomotion level  PARTICIPATION LIMITATIONS: community activity and yard work  PERSONAL FACTORS: Past/current experiences are also affecting patient's functional outcome.   REHAB POTENTIAL: Good  CLINICAL DECISION  MAKING: Stable/uncomplicated  EVALUATION COMPLEXITY: Low   GOALS: Goals reviewed with patient? Yes  SHORT TERM GOALS: Target date: 03/07/2022  Independent in initial HEP Baseline: Goal status: INITIAL     LONG TERM GOALS: Target date: 04/04/2022    Increase AROM Rt knee to=/>than (-) 10 degrees extension Baseline:  Goal status: INITIAL  2.  Improve tissue extensibility with patient to move for transfers and transitional  Baseline:  Goal status: INITIAL  3.  Patient reports decreased pain and tightness by 50-70% after physical activity  Baseline:  Goal status: INITIAL  4.  Independent in HEP including aquatic therapy as indicated  Baseline:  Goal status: INITIAL  5.  Improve functional limitation score to 62 Baseline:  Goal status: INITIAL   PLAN: Patient will work on initial HEP for ~ 2 weeks and schedule a f/u appointment to progress exercises to include LE stretching, strengthening and core stabilization.   PT FREQUENCY: 2x/week  PT DURATION: 8 weeks  PLANNED INTERVENTIONS: Therapeutic exercises, Therapeutic activity, Neuromuscular re-education, Balance training, Gait training, Patient/Family education, Self Care, Joint mobilization, Stair training, Aquatic Therapy, Dry Needling, Electrical stimulation, Cryotherapy, Moist heat, Taping, Vasopneumatic device, Ultrasound, Ionotophoresis '4mg'$ /ml Dexamethasone, Manual therapy, and Re-evaluation  PLAN FOR NEXT SESSION: review and progress exercise program; manual work, DN, modalities as indicated    Nashira Mcglynn Nilda Simmer, PT, MPH  02/06/2022, 4:33 PM

## 2022-02-06 NOTE — Progress Notes (Signed)
[name] - 76 y.o. male MRN [id number]  Date of birth: 1945/06/14  Subjective Chief Complaint  Patient presents with   Annual Exam    HPI Jonathon Taylor is a 76 year old male here today for annual exam.  Reports overall he is feeling well.  He has had some knee pain.  He did start doing gelatin supplement and feels like this has actually helped.  He is able to walk and without significant knee pain.  He is interested in adding some physical therapy for his knees as well.  He does stay pretty active doing various things.  Additionally, he feels like his diet is pretty healthy.  He is a non-smoker.  He does have 2 glasses of red wine each night.  He does continue to have regular dental care.  Review of Systems  Constitutional:  Negative for chills, fever, malaise/fatigue and weight loss.  HENT:  Negative for congestion, ear pain and sore throat.   Eyes:  Negative for blurred vision, double vision and pain.  Respiratory:  Negative for cough and shortness of breath.   Cardiovascular:  Negative for chest pain and palpitations.  Gastrointestinal:  Negative for abdominal pain, blood in stool, constipation, heartburn and nausea.  Genitourinary:  Negative for dysuria and urgency.  Musculoskeletal:  Negative for joint pain and myalgias.  Neurological:  Negative for dizziness and headaches.  Endo/Heme/Allergies:  Does not bruise/bleed easily.  Psychiatric/Behavioral:  Negative for depression. The patient is not nervous/anxious and does not have insomnia.     No Known Allergies  Past Medical History:  Diagnosis Date   Anemia    ON IRON FOR PRIOR ANEMIA R/T BLEEDING BEFORE TURBT    Basal cell carcinoma 05/23/1992   LEFT UPPER BACK NEAR SCAR   BCC (basal cell carcinoma of skin) 11/22/1993   BEHIND LEFT EAR tx EFUDEX   BCC (basal cell carcinoma of skin) 03/11/2003   RIGHT SHOULDER TX WITH BX   BCC (basal cell carcinoma of skin) 03/11/2003   LEFT SHOULDER TX WITH BX   BCC  (basal cell carcinoma of skin) 11/24/2013   right forearm tx picato   BCC (basal cell carcinoma of skin) 04/19/2014   right shoulder inferior tx with bx   BCC (basal cell carcinoma of skin) 02/26/2018   right forearm tx cx3 49f   BCC (basal cell carcinoma of skin)    GERD (gastroesophageal reflux disease)    H/O cardiac catheterization 2004   negative;Dr STollie Eth  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   Left Parotid Ear/Neck   Nodular basal cell carcinoma (BCC) 07/03/2021   Left Nasal Sidewall   Personal history of colonic polyps 08/12/2015   Prostate enlargement    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   left temple tx cx3 [id number]  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   right shoulder front tx with bx   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   top right shoulder tx with bx   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   below left ear tx with bx   SCC (squamous cell carcinoma) 03/03/2008   left temple tx efudex   SCC (squamous cell carcinoma) 11/14/2011   upper right arm tx with bx   SCC (squamous cell carcinoma) 11/24/2013   right sideburn picato cx3 [id number] SCC (squamous cell carcinoma) 02/26/2018   left upper arm cx3 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   Left Hand Psoterior (in situ)   SCCA (squamous  cell carcinoma) of skin 10/13/2019   Right Forearm Posterior (well diff)   SCCA (squamous cell carcinoma) of skin 08/16/2020   Left Upper Posterior Arm (well diff) treated after biopsy   SCCA (squamous cell carcinoma) of skin 01/16/2021   Left Hand - posterior (well diff)   SCCA (squamous cell carcinoma) of skin 07/03/2021   Mid Tip of Nose (well diff with superficial inflitration)   Seasonal allergies    Skin cancer    Dr [name]   Squamous cell carcinoma of skin 11/22/1993   right temple tx efudex    Past Surgical History:  Procedure Laterality Date   arthroscopic knee surgery Left 1995   BASAL CELL CARCINOMA EXCISION      2000-2017   CARDIAC CATHETERIZATION  02/27/2003   per patient " i had spot on symptoms of a heart attack and a specialist, dr Tollie Eth ,  was brought in and after i had  the cath ; they said it was bad reflux . " per patient , after changing diet , no reccurrence of symptoms    COLONOSCOPY     INGUINAL HERNIA REPAIR Bilateral 03/12/2017   Procedure: LAPAROSCOPIC BILATERAL INGUINAL HERNIA WITH  MESH;  Surgeon: Clovis Riley, MD;  Location: [facility];  Service: General;  Laterality: Bilateral;   INSERTION OF MESH N/A 03/12/2017   Procedure: INSERTION OF MESH;  Surgeon: Clovis Riley, MD;  Location: [facility];  Service: General;  Laterality: N/A;   TRANSURETHRAL RESECTION OF BLADDER TUMOR N/A 07/08/2017   Procedure: CYSTO CLOT EVACUATION AND FULGRATION;  Surgeon: Irine Seal, MD;  Location: [facility];  Service: Urology;  Laterality: N/A;   XI ROBOTIC ASSISTED SIMPLE PROSTATECTOMY N/A 09/19/2017   Procedure: XI ROBOTIC ASSISTED SIMPLE PROSTATECTOMY;  Surgeon: Cleon Gustin, MD;  Location: [facility];  Service: Urology;  Laterality: N/A;    Social History   Socioeconomic History   Marital status: Married    Spouse name: Not on file   Number of children: Not on file   Years of education: Not on file   Highest education level: Not on file  Occupational History   Occupation: Professor  Tobacco Use   Smoking status: Never   Smokeless tobacco: Former  Scientific laboratory technician Use: Never used  Substance and Sexual Activity   Alcohol use: Yes    Alcohol/week: 0.0 standard drinks of alcohol    Comment: 2 glasses of red wine nightly   Drug use: No   Sexual activity: Yes  Other Topics Concern   Not on file  Social History Narrative   Married. Education: Other. Exercise: Bike/light weights everyday for 45-60 minutes.   Social Determinants of Health   Financial Resource Strain: Not on file  Food Insecurity: Not on file  Transportation Needs: Not on file  Physical Activity: Not on file   Stress: Not on file  Social Connections: Not on file    Family History  Problem Relation Age of Onset   Heart disease Mother    Heart disease Father    Heart disease Sister    Heart disease Maternal Grandfather    Colon cancer Neg Hx    Colon polyps Neg Hx    Esophageal cancer Neg Hx    Rectal cancer Neg Hx    Stomach cancer Neg Hx     Health Maintenance  Topic Date Due   COVID-19 Vaccine (5 - Moderna risk series) 04/26/2022 (Originally 09/12/2020)   Medicare Annual Wellness (AWV)  04/26/2022 (Originally 09/07/2021)   COLONOSCOPY (Pts 45-98yr Insurance coverage will need to be confirmed)  11/08/2023   TETANUS/TDAP  05/02/2026   Pneumonia Vaccine 75 Years old  Completed   INFLUENZA VACCINE  Completed   Hepatitis C Screening  Completed   Zoster Vaccines- Shingrix  Completed   HPV VACCINES  Aged Out     ----------------------------------------------------------------------------------------------------------------------------------------------------------------------------------------------------------------- Physical Exam BP 112/75 (BP Location: Left Arm, Patient Position: Sitting, Cuff Size: Normal)   Pulse 60   Ht '5\' 8"'$  (1.727 m)   Wt 162 lb (73.5 kg)   SpO2 98%   BMI 24.63 kg/m   Physical Exam Constitutional:      General: He is not in acute distress. HENT:     Head: Normocephalic and atraumatic.     Right Ear: Tympanic membrane and external ear normal.     Left Ear: Tympanic membrane and external ear normal.  Eyes:     General: No scleral icterus. Neck:     Thyroid: No thyromegaly.  Cardiovascular:     Rate and Rhythm: Normal rate and regular rhythm.     Heart sounds: Normal heart sounds.  Pulmonary:     Effort: Pulmonary effort is normal.     Breath sounds: Normal breath sounds.  Abdominal:     General: Bowel sounds are normal. There is no distension.     Palpations: Abdomen is soft.     Tenderness: There is no abdominal tenderness. There is no  guarding.  Musculoskeletal:     Cervical back: Normal range of motion.  Lymphadenopathy:     Cervical: No cervical adenopathy.  Skin:    General: Skin is warm and dry.     Findings: No rash.  Neurological:     Mental Status: He is alert and oriented to person, place, and time.     Cranial Nerves: No cranial nerve deficit.     Motor: No abnormal muscle tone.  Psychiatric:        Mood and Affect: Mood normal.        Behavior: Behavior normal.     ------------------------------------------------------------------------------------------------------------------------------------------------------------------------------------------------------------------- Assessment and Plan  Well adult exam Well adult Orders Placed This Encounter  Procedures   Ambulatory referral to Physical Therapy    Referral Priority:   Routine    Referral Type:   Physical Medicine    Referral Reason:   Specialty Services Required    Requested Specialty:   Physical Therapy    Number of Visits Requested:   1  Screenings: Up-to-date Immunizations: Up-to-date Anticipatory guidance/risk factor reduction: Recommendations per AVS.  Chronic pain of both knees Arthritic changes of the knees noted on x-rays.  He feels that he has had improvement with gelatin supplement which he is consuming for collagen intake. He May use Voltaren gel over-the-counter as needed as well.   No orders of the defined types were placed in this encounter.   No follow-ups on file.    This visit occurred during the SARS-CoV-2 public health emergency.  Safety protocols were in place, including screening questions prior to the visit, additional usage of staff PPE, and extensive cleaning of exam room while observing appropriate contact time as indicated for disinfecting solutions.

## 2022-02-07 ENCOUNTER — Ambulatory Visit: Payer: Medicare Other | Attending: Family Medicine | Admitting: Rehabilitative and Restorative Service Providers"

## 2022-02-07 ENCOUNTER — Other Ambulatory Visit: Payer: Self-pay

## 2022-02-07 DIAGNOSIS — R29898 Other symptoms and signs involving the musculoskeletal system: Secondary | ICD-10-CM | POA: Diagnosis present

## 2022-02-07 DIAGNOSIS — G8929 Other chronic pain: Secondary | ICD-10-CM | POA: Insufficient documentation

## 2022-02-07 DIAGNOSIS — M25562 Pain in left knee: Secondary | ICD-10-CM | POA: Insufficient documentation

## 2022-02-07 DIAGNOSIS — M25561 Pain in right knee: Secondary | ICD-10-CM | POA: Diagnosis not present

## 2022-02-19 ENCOUNTER — Other Ambulatory Visit: Payer: Self-pay | Admitting: Family Medicine

## 2022-02-20 ENCOUNTER — Ambulatory Visit: Payer: Medicare Other | Admitting: Rehabilitative and Restorative Service Providers"

## 2022-02-20 ENCOUNTER — Encounter: Payer: Self-pay | Admitting: Rehabilitative and Restorative Service Providers"

## 2022-02-20 DIAGNOSIS — M25561 Pain in right knee: Secondary | ICD-10-CM | POA: Diagnosis not present

## 2022-02-20 DIAGNOSIS — G8929 Other chronic pain: Secondary | ICD-10-CM

## 2022-02-20 DIAGNOSIS — R29898 Other symptoms and signs involving the musculoskeletal system: Secondary | ICD-10-CM

## 2022-02-20 NOTE — Telephone Encounter (Signed)
Last appt 02/06/2022  Last written 02/21/2021 #30 with 3 refills

## 2022-02-20 NOTE — Therapy (Signed)
OUTPATIENT PHYSICAL THERAPY LOWER EXTREMITY TREATMENT   Patient Name: Jonathon Taylor MRN: [id number] DOB:Aug 11, 1945, 76 y.o., male Today's Date: 02/20/2022   PT End of Session - 02/20/22 0935     Visit Number 2    Number of Visits 16    Date for PT Re-Evaluation 04/04/22    PT Start Time 0930    PT Stop Time 4098    PT Time Calculation (min) 45 min    Activity Tolerance Patient tolerated treatment well             Past Medical History:  Diagnosis Date   Anemia    ON IRON FOR PRIOR ANEMIA R/T BLEEDING BEFORE TURBT    Basal cell carcinoma 05/23/1992   LEFT UPPER BACK NEAR SCAR   BCC (basal cell carcinoma of skin) 11/22/1993   BEHIND LEFT EAR tx EFUDEX   BCC (basal cell carcinoma of skin) 03/11/2003   RIGHT SHOULDER TX WITH BX   BCC (basal cell carcinoma of skin) 03/11/2003   LEFT SHOULDER TX WITH BX   BCC (basal cell carcinoma of skin) 11/24/2013   right forearm tx picato   BCC (basal cell carcinoma of skin) 04/19/2014   right shoulder inferior tx with bx   BCC (basal cell carcinoma of skin) 02/26/2018   right forearm tx cx3 34f   BCC (basal cell carcinoma of skin)    GERD (gastroesophageal reflux disease)    H/O cardiac catheterization 2004   negative;Dr STollie Eth  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   Left Parotid Ear/Neck   Nodular basal cell carcinoma (BCC) 07/03/2021   Left Nasal Sidewall   Personal history of colonic polyps 08/12/2015   Prostate enlargement    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   left temple tx cx3 5149f  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   right shoulder front tx with bx   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   top right shoulder tx with bx   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   below left ear tx with bx   SCC (squamous cell carcinoma) 03/03/2008   left temple tx efudex   SCC (squamous cell carcinoma) 11/14/2011   upper right arm tx with bx   SCC (squamous cell carcinoma) 11/24/2013    right sideburn picato cx3 [id number] SCC (squamous cell carcinoma) 02/26/2018   left upper arm cx3 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   Left Hand Psoterior (in situ)   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   Right Forearm Posterior (well diff)   SCCA (squamous cell carcinoma) of skin 08/16/2020   Left Upper Posterior Arm (well diff) treated after biopsy   SCCA (squamous cell carcinoma) of skin 01/16/2021   Left Hand - posterior (well diff)   SCCA (squamous cell carcinoma) of skin 07/03/2021   Mid Tip of Nose (well diff with superficial inflitration)   Seasonal allergies    Skin cancer    Dr StuaLavonna Monarchquamous cell carcinoma of skin 11/22/1993   right temple tx efudex   Past Surgical History:  Procedure Laterality Date   arthroscopic knee surgery Left 1995   BASAL CELL CARCINOMA EXCISION     2000-2017   CARDIAC CATHETERIZATION  02/27/2003   per patient " i had spot on symptoms of a heart attack and a specialist, dr spenTollie Ethwas brought in and after i had  the cath ; they said it was bad reflux . "  per patient , after changing diet , no reccurrence of symptoms    COLONOSCOPY     INGUINAL HERNIA REPAIR Bilateral 03/12/2017   Procedure: LAPAROSCOPIC BILATERAL INGUINAL HERNIA WITH  MESH;  Surgeon: Clovis Riley, MD;  Location: [facility];  Service: General;  Laterality: Bilateral;   INSERTION OF MESH N/A 03/12/2017   Procedure: INSERTION OF MESH;  Surgeon: Clovis Riley, MD;  Location: [facility];  Service: General;  Laterality: N/A;   TRANSURETHRAL RESECTION OF BLADDER TUMOR N/A 07/08/2017   Procedure: CYSTO CLOT EVACUATION AND FULGRATION;  Surgeon: Irine Seal, MD;  Location: [facility];  Service: Urology;  Laterality: N/A;   XI ROBOTIC ASSISTED SIMPLE PROSTATECTOMY N/A 09/19/2017   Procedure: XI ROBOTIC ASSISTED SIMPLE PROSTATECTOMY;  Surgeon: Cleon Gustin, MD;  Location: [facility];  Service: Urology;  Laterality: N/A;   Patient Active  Problem List   Diagnosis Date Noted   COVID-19 03/31/2021   Chronic pain of both knees 09/11/2020   Well adult exam 09/01/2019   Elevated LDL cholesterol level 10/17/2018   Low calcium levels 04/18/2018   Spondylisthesis 08/29/2015   Personal history of colonic polyps 08/12/2015   Insomnia 01/18/2014   RAD (reactive airway disease) 11/26/2012   Family history of cardiovascular disease 11/26/2012    PCP: Dr Luetta Nutting  REFERRING PROVIDER: Dr Luetta Nutting   REFERRING DIAG: Chronic pain bilat knees   THERAPY DIAG:  Chronic pain of right knee  Chronic pain of left knee  Other symptoms and signs involving the musculoskeletal system  Rationale for Evaluation and Treatment: Rehabilitation  ONSET DATE: 03/26/21  SUBJECTIVE:   SUBJECTIVE STATEMENT: 02/20/22: Patient reports that he has been working on the exercises at home.   EVAL: Patient reports that he has had bilat knee pain in both knees which he feels is due to heavy gardening. He had xrays of knees and has some degenerative changes noted in both knees. He would like to stretch and strengthen knees. He has been doing some exercises at home. Now taking supplements to help joints and is now noticing less pain.   PERTINENT HISTORY: Bilat knee arthritis; spondylosis lumbar spine.  PAIN:  Are you having pain? Yes: NPRS scale: 2/10 Pain location: Rt > Lt Pain description: aching; sore Aggravating factors: stairs; squatting; walking after being immobile  Relieving factors: movement; rest  PRECAUTIONS: None  WEIGHT BEARING RESTRICTIONS: No  FALLS:  Has patient fallen in last 6 months? No  LIVING ENVIRONMENT: Lives with: lives with their spouse Lives in: House/apartment   OCCUPATION: retired  PLOF: Independent  PATIENT GOALS: learn exercises for knees  NEXT MD VISIT: PRN  OBJECTIVE:   DIAGNOSTIC FINDINGS: 09/07/21 xrays -Lt knee - Slight spurring medially. No appreciable joint space narrowing. No fracture,  dislocation, or joint effusion. Rt knee - Osteoarthritic change medially and to a lesser degree in the patellofemoral joint. No fracture, dislocation, or joint effusion.  PATIENT SURVEYS:  FOTO 53  MUSCLE LENGTH: Hamstrings: Right 65 deg; Left 70 deg Thomas test: Right -10 deg; Left -5 deg  POSTURE: knees in varus bilat   PALPATION: Muscular tightness bilat, Rt > Lt, hip flexors; quads; posterior hips; HS   LOWER EXTREMITY ROM:  Active ROM Right eval Left eval  Hip flexion -10 -5  Hip extension WNL WNL  Hip abduction    Hip adduction    Hip internal rotation    Hip external rotation    Knee flexion WNL WNL  Knee extension -16 -5  Ankle  dorsiflexion    Ankle plantarflexion    Ankle inversion    Ankle eversion     (Blank rows = not tested)  LOWER EXTREMITY MMT:  LE strength is 5/5 throughout with MMT   FUNCTIONAL TESTS:  SLS - 10+ sec each LE more difficult on Rt   GAIT: Distance walked: 40 Assistive device utilized: None Level of assistance: Complete Independence Comments: ambulates with trunk and hips in some forward flexion; knees flexed; note varus deformity bilat knees    TODAY'S TREATMENT:                                                                                                                              Date: 02/20/22: Therapeutic exercise: Supine:  Hamstring stretch w/strap 30 sec x 2   Piriformis stretch travell 30 sec x 2   Hip flexor stretch modified Thomas 30 sec x 2 added stretch out strap to increase knee flexion   Transverse abdominal 10 sec x 10   Quad set heel on noodle 5 sec x 10   Sitting:   Sit to stand core engaged hinged hip x 10   Thoracic extension in chair x 2 arms overhead  Thoracic extension coregeous ball x 2   Thoracic extension coregeous ball rotation x 2  Standing:   Wall squat 10 sec x 10   Terminal knee extension w/ball 10 sec x 10    Prone:  Quad stretch w/strap pillow under abdomen to protect back 30 sec x  2    DATE: 02/07/22 Education re- knee ROM, strength, role of exercise in knee rehab and in fitness   Therapeutic exercise: Supine:  Hamstring stretch w/strap 30 sec x 2   Piriformis stretch travell 30 sec x 2   Hip flexor stretch modified Thomas 30 sec x 2   Transverse abdominal 10 sec x 10   Quad set towel under knee 5 sec x 10   Prone:  Quad stretch w/strap pillow under abdomen 30 sec x 2     PATIENT EDUCATION:  Education details: POC; HEP  Person educated: Patient Education method: Consulting civil engineer, Media planner, Corporate treasurer cues, Verbal cues, and Handouts Education comprehension: verbalized understanding, returned demonstration, verbal cues required, tactile cues required, and needs further education  HOME EXERCISE PROGRAM: Access Code: [email or web address] URL: [email or web address] Date: 02/20/2022 Prepared by: Gillermo Murdoch  Exercises - Hooklying Hamstring Stretch with Strap  - 1 x daily - 7 x weekly - 1 sets - 3 reps - 30 sec  hold - Supine Piriformis Stretch with Leg Straight  - 1 x daily - 7 x weekly - 1 sets - 3 reps - 30 sec  hold - Hip Flexor Stretch at Edge of Bed  - 1 x daily - 7 x weekly - 1 sets - 3 reps - 30 sec  hold - Prone Quadriceps Stretch with Strap  - 1 x daily - 7 x weekly - 1 sets - 3 reps - 30  sec  hold - Supine Quad Set on Towel Roll  - 1 x daily - 7 x weekly - 2-3 sets - 10 reps - 5 sec  hold - Supine Transversus Abdominis Bracing with Pelvic Floor Contraction  - 1 x daily - 7 x weekly - 1 sets - 10 reps - 10sec  hold - Quad Setting and Stretching  - 1 x daily - 7 x weekly - 1 sets - 5-10 reps - 5-10 sec  hold - Sit to Stand  - 1 x daily - 7 x weekly - 1 sets - 10 reps - 3-5 sec  hold - Wall Quarter Squat  - 1 x daily - 7 x weekly - 1-2 sets - 10 reps - 5-10 sec  hold - Standing Terminal Knee Extension at Wall with Ball  - 2 x daily - 7 x weekly - 1-2 sets - 10 reps - 5-10 sec  hold - Seated Thoracic Lumbar Extension with Pectoralis Stretch  - 2 x daily  - 7 x weekly - 1 sets - 5-8 reps - 10 sec  hold - Seated Thoracic Extension Arms Overhead  - 2 x daily - 7 x weekly - 1 sets - 3 reps - 30 sec  hold - Seated Thoracic Extension and Rotation with Reach  - 2 x daily - 7 x weekly - 1 sets - 3 reps - 10 sec  hold  ASSESSMENT:  CLINICAL IMPRESSION: 02/20/22: Patient reports compliance with HEP. Requires some review and suggestions for modifications. Progressed with LE strengthening.   Eval: Patient is a 76 y.o. male who was seen today for physical therapy evaluation and treatment for chronic bilateral knee pain. He has poor posture and alignment; abnormal gait pattern; limited ROM bilat knees with greatest limitation in Rt knee extension which is (-)16 degrees; significant tissue tightness through blat LE's contributing to knee pain and limited functional activities. Patient has pain and stiffness for at least 24 hours after he has been doing more vigorous physical activity. Patient will benefit from PT to address problems identified.    GOALS: Goals reviewed with patient? Yes  SHORT TERM GOALS: Target date: 03/07/2022  Independent in initial HEP Baseline: Goal status: INITIAL   LONG TERM GOALS: Target date: 04/04/2022   Increase AROM Rt knee to=/>than (-) 10 degrees extension Baseline:  Goal status: INITIAL  2.  Improve tissue extensibility with patient to move for transfers and transitional  Baseline:  Goal status: INITIAL  3.  Patient reports decreased pain and tightness by 50-70% after physical activity  Baseline:  Goal status: INITIAL  4.  Independent in HEP including aquatic therapy as indicated  Baseline:  Goal status: INITIAL  5.  Improve functional limitation score to 62 Baseline:  Goal status: INITIAL   PLAN: Patient will work on initial HEP for ~ 2 weeks and schedule a f/u appointment to progress exercises to include LE stretching, strengthening and core stabilization.   PT FREQUENCY: 2x/week  PT DURATION: 8  weeks  PLANNED INTERVENTIONS: Therapeutic exercises, Therapeutic activity, Neuromuscular re-education, Balance training, Gait training, Patient/Family education, Self Care, Joint mobilization, Stair training, Aquatic Therapy, Dry Needling, Electrical stimulation, Cryotherapy, Moist heat, Taping, Vasopneumatic device, Ultrasound, Ionotophoresis '4mg'$ /ml Dexamethasone, Manual therapy, and Re-evaluation  PLAN FOR NEXT SESSION: review and progress exercise program; manual work, DN, modalities as indicated    Lucindy Borel Nilda Simmer, PT, MPH  02/20/2022, 9:35 AM

## 2022-03-05 ENCOUNTER — Encounter: Payer: Self-pay | Admitting: Rehabilitative and Restorative Service Providers"

## 2022-03-05 ENCOUNTER — Ambulatory Visit: Payer: Medicare Other | Attending: Family Medicine | Admitting: Rehabilitative and Restorative Service Providers"

## 2022-03-05 DIAGNOSIS — R29898 Other symptoms and signs involving the musculoskeletal system: Secondary | ICD-10-CM | POA: Diagnosis present

## 2022-03-05 DIAGNOSIS — G8929 Other chronic pain: Secondary | ICD-10-CM | POA: Diagnosis present

## 2022-03-05 DIAGNOSIS — M25562 Pain in left knee: Secondary | ICD-10-CM | POA: Insufficient documentation

## 2022-03-05 DIAGNOSIS — M25561 Pain in right knee: Secondary | ICD-10-CM | POA: Diagnosis present

## 2022-03-05 NOTE — Patient Instructions (Addendum)
Progress with sit to stand to move more slowly and pause at various stages of the sitting from standing. This will increase the difficulty of the exercise and make you stronger.    Standing with coregeous ball between shoulder blades - pinch shoulder blades back together

## 2022-03-05 NOTE — Therapy (Signed)
OUTPATIENT PHYSICAL THERAPY LOWER EXTREMITY TREATMENT   Patient Name: Jonathon Taylor MRN: [id number] DOB:[birth year], 76 y.o., male Today's Date: 03/05/2022   PT End of Session - 03/05/22 1319     Visit Number 3    Number of Visits 16    Date for PT Re-Evaluation 04/04/22    PT Start Time 1318    PT Stop Time 1401    PT Time Calculation (min) 43 min    Activity Tolerance Patient tolerated treatment well             Past Medical History:  Diagnosis Date   Anemia    ON IRON FOR PRIOR ANEMIA R/T BLEEDING BEFORE TURBT    Basal cell carcinoma 05/23/1992   LEFT UPPER BACK NEAR SCAR   BCC (basal cell carcinoma of skin) 11/22/1993   BEHIND LEFT EAR tx EFUDEX   BCC (basal cell carcinoma of skin) 03/11/2003   RIGHT SHOULDER TX WITH BX   BCC (basal cell carcinoma of skin) 03/11/2003   LEFT SHOULDER TX WITH BX   BCC (basal cell carcinoma of skin) 11/24/2013   right forearm tx picato   BCC (basal cell carcinoma of skin) 04/19/2014   right shoulder inferior tx with bx   BCC (basal cell carcinoma of skin) 02/26/2018   right forearm tx cx3 57f   BCC (basal cell carcinoma of skin)    GERD (gastroesophageal reflux disease)    H/O cardiac catheterization 2004   negative;Dr STollie Eth  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   Left Parotid Ear/Neck   Nodular basal cell carcinoma (BCC) 07/03/2021   Left Nasal Sidewall   Personal history of colonic polyps 08/12/2015   Prostate enlargement    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   left temple tx cx3 537f  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   right shoulder front tx with bx   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   top right shoulder tx with bx   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   below left ear tx with bx   SCC (squamous cell carcinoma) 03/03/2008   left temple tx efudex   SCC (squamous cell carcinoma) 11/14/2011   upper right arm tx with bx   SCC (squamous cell carcinoma) 11/24/2013    right sideburn picato cx3 [id number] SCC (squamous cell carcinoma) 02/26/2018   left upper arm cx3 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   Left Hand Psoterior (in situ)   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   Right Forearm Posterior (well diff)   SCCA (squamous cell carcinoma) of skin 08/16/2020   Left Upper Posterior Arm (well diff) treated after biopsy   SCCA (squamous cell carcinoma) of skin 01/16/2021   Left Hand - posterior (well diff)   SCCA (squamous cell carcinoma) of skin 07/03/2021   Mid Tip of Nose (well diff with superficial inflitration)   Seasonal allergies    Skin cancer    Dr StuaLavonna Monarchquamous cell carcinoma of skin 11/22/1993   right temple tx efudex   Past Surgical History:  Procedure Laterality Date   arthroscopic knee surgery Left 1995   BASAL CELL CARCINOMA EXCISION     2000-2017   CARDIAC CATHETERIZATION  02/27/2003   per patient " i had spot on symptoms of a heart attack and a specialist, dr spenTollie Ethwas brought in and after i had  the cath ; they said it was bad reflux . "  per patient , after changing diet , no reccurrence of symptoms    COLONOSCOPY     INGUINAL HERNIA REPAIR Bilateral 03/12/2017   Procedure: LAPAROSCOPIC BILATERAL INGUINAL HERNIA WITH  MESH;  Surgeon: Clovis Riley, MD;  Location: [facility];  Service: General;  Laterality: Bilateral;   INSERTION OF MESH N/A 03/12/2017   Procedure: INSERTION OF MESH;  Surgeon: Clovis Riley, MD;  Location: [facility];  Service: General;  Laterality: N/A;   TRANSURETHRAL RESECTION OF BLADDER TUMOR N/A 07/08/2017   Procedure: CYSTO CLOT EVACUATION AND FULGRATION;  Surgeon: Irine Seal, MD;  Location: [facility];  Service: Urology;  Laterality: N/A;   XI ROBOTIC ASSISTED SIMPLE PROSTATECTOMY N/A 09/19/2017   Procedure: XI ROBOTIC ASSISTED SIMPLE PROSTATECTOMY;  Surgeon: Cleon Gustin, MD;  Location: [facility];  Service: Urology;  Laterality: N/A;   Patient Active  Problem List   Diagnosis Date Noted   COVID-19 03/31/2021   Chronic pain of both knees 09/11/2020   Well adult exam 09/01/2019   Elevated LDL cholesterol level 10/17/2018   Low calcium levels 04/18/2018   Spondylisthesis 08/29/2015   Personal history of colonic polyps 08/12/2015   Insomnia 01/18/2014   RAD (reactive airway disease) 11/26/2012   Family history of cardiovascular disease 11/26/2012    PCP: Dr Luetta Nutting  REFERRING PROVIDER: Dr Luetta Nutting   REFERRING DIAG: Chronic pain bilat knees   THERAPY DIAG:  Chronic pain of right knee  Chronic pain of left knee  Other symptoms and signs involving the musculoskeletal system  Rationale for Evaluation and Treatment: Rehabilitation  ONSET DATE: 03/26/21  SUBJECTIVE:   SUBJECTIVE STATEMENT: 03/05/22: Patient reports continued improvement in the knees and core strength. Please with his exercises and wants to do more especially functional exercises. Patient reports that he has been working on the exercises at home. Can see that the exercises and activities he is working on are helpful. Patient reports that he was able to use the pitchfork to move leaves from the driveway and did not have pain during activity he did not wake up with pain.   PERTINENT HISTORY: Bilat knee arthritis; spondylosis lumbar spine.  PAIN:  Are you having pain? Yes: NPRS scale: 0/10 Pain location: Rt > Lt Pain description: aching; sore Aggravating factors: stairs; squatting; walking after being immobile  Relieving factors: movement; rest  PRECAUTIONS: None  WEIGHT BEARING RESTRICTIONS: No  FALLS:  Has patient fallen in last 6 months? No  PATIENT GOALS: learn exercises for knees  NEXT MD VISIT: PRN  OBJECTIVE:   DIAGNOSTIC FINDINGS: 09/07/21 xrays -Lt knee - Slight spurring medially. No appreciable joint space narrowing. No fracture, dislocation, or joint effusion. Rt knee - Osteoarthritic change medially and to a lesser degree in the  patellofemoral joint. No fracture, dislocation, or joint effusion.  PATIENT SURVEYS:  FOTO 53  MUSCLE LENGTH: Hamstrings: Right 65 deg; Left 70 deg Thomas test: Right -10 deg; Left -5 deg  POSTURE: knees in varus bilat   PALPATION: Muscular tightness bilat, Rt > Lt, hip flexors; quads; posterior hips; HS   LOWER EXTREMITY ROM:  Active ROM Right eval Left eval  Hip flexion -10 -5  Hip extension WNL WNL  Hip abduction    Hip adduction    Hip internal rotation    Hip external rotation    Knee flexion WNL WNL  Knee extension -16 -5  Ankle dorsiflexion    Ankle plantarflexion    Ankle inversion    Ankle eversion     (  Blank rows = not tested)  LOWER EXTREMITY MMT:  LE strength is 5/5 throughout with MMT   FUNCTIONAL TESTS:  SLS - 10+ sec each LE more difficult on Rt   GAIT: Distance walked: 40 Assistive device utilized: None Level of assistance: Complete Independence Comments: ambulates with trunk and hips in some forward flexion; knees flexed; note varus deformity bilat knees    TODAY'S TREATMENT:     Date: 03/05/22:        Therapeutic exercise: Supine:  Hamstring stretch w/strap 30 sec x 2   Piriformis stretch travell 30 sec x 2   Hip flexor stretch modified Thomas 30 sec x 2 added stretch out strap to increase knee flexion   Transverse abdominal 10 sec x 10   Quad set heel on noodle 5 sec x 10   Sitting:   Sit to stand core engaged hinged hip x 10  Slow stand to sit - eccentrically x 10   Thoracic extension in chair x 2 arms overhead  Thoracic extension coregeous ball x 2   Thoracic extension coregeous ball rotation x 2  Standing:   Wall squat 10 sec x 10   Wall squat with exercise ball- 10 sec x 10   Terminal knee extension w/ball x 10   Step up fwd x 10 each LE  Lateral step up heel tap x 10  Hip flexor stretch standing at steps x 1 each LE    PATIENT EDUCATION:  Education details: POC; HEP  Person educated: Patient Education method:  Consulting civil engineer, Demonstration, Corporate treasurer cues, Verbal cues, and Handouts Education comprehension: verbalized understanding, returned demonstration, verbal cues required, tactile cues required, and needs further education  HOME EXERCISE PROGRAM: Access Code: [email or web address] URL: [email or web address] Date: 03/05/2022 Prepared by: Gillermo Murdoch  Exercises - Hooklying Hamstring Stretch with Strap  - 1 x daily - 7 x weekly - 1 sets - 3 reps - 30 sec  hold - Supine Piriformis Stretch with Leg Straight  - 1 x daily - 7 x weekly - 1 sets - 3 reps - 30 sec  hold - Hip Flexor Stretch at Edge of Bed  - 1 x daily - 7 x weekly - 1 sets - 3 reps - 30 sec  hold - Prone Quadriceps Stretch with Strap  - 1 x daily - 7 x weekly - 1 sets - 3 reps - 30 sec  hold - Supine Quad Set on Towel Roll  - 1 x daily - 7 x weekly - 2-3 sets - 10 reps - 5 sec  hold - Supine Transversus Abdominis Bracing with Pelvic Floor Contraction  - 1 x daily - 7 x weekly - 1 sets - 10 reps - 10sec  hold - Quad Setting and Stretching  - 1 x daily - 7 x weekly - 1 sets - 5-10 reps - 5-10 sec  hold - Sit to Stand  - 1 x daily - 7 x weekly - 1 sets - 10 reps - 3-5 sec  hold - Wall Quarter Squat  - 1 x daily - 7 x weekly - 1-2 sets - 10 reps - 5-10 sec  hold - Standing Terminal Knee Extension at Wall with Ball  - 2 x daily - 7 x weekly - 1-2 sets - 10 reps - 5-10 sec  hold - Seated Thoracic Lumbar Extension with Pectoralis Stretch  - 2 x daily - 7 x weekly - 1 sets - 5-8 reps - 10 sec  hold - Seated Thoracic Extension  Arms Overhead  - 2 x daily - 7 x weekly - 1 sets - 3 reps - 30 sec  hold - Seated Thoracic Extension and Rotation with Reach  - 2 x daily - 7 x weekly - 1 sets - 3 reps - 10 sec  hold - Step Up  - 2 x daily - 7 x weekly - 1 sets - 10 reps - 2 sec  hold - Lateral Step Up  - 1 x daily - 7 x weekly - 2 sets - 10 reps - 2 sec  hold - Wall Quarter Squat with [employer]  - 1 x daily - 7 x weekly - 1-2 sets - 10 reps - 3-5 sec   hold - Standing Knee Flexion Stretch on Step  - 1 x daily - 7 x weekly - 1 sets - 3 reps - 30 sec  hold - Single Leg Stance  - 2 x daily - 7 x weekly - 2 sets - 5 reps - 20 sec hold - [employer]  - 2 x daily - 7 x weekly - 1 sets - 10 reps - 2-3 sec  hold ASSESSMENT:  CLINICAL IMPRESSION: 03/05/22: Patient reports compliance with HEP. Continued with review and suggestions for modifications in exercises. Progressed with LE strengthening, balance activities.   Eval: Patient is a 76 y.o. male who was seen today for physical therapy evaluation and treatment for chronic bilateral knee pain. He has poor posture and alignment; abnormal gait pattern; limited ROM bilat knees with greatest limitation in Rt knee extension which is (-)16 degrees; significant tissue tightness through blat LE's contributing to knee pain and limited functional activities. Patient has pain and stiffness for at least 24 hours after he has been doing more vigorous physical activity.  GOALS: Goals reviewed with patient? Yes  SHORT TERM GOALS: Target date: 03/07/2022  Independent in initial HEP Baseline: Goal status: INITIAL   LONG TERM GOALS: Target date: 04/04/2022   Increase AROM Rt knee to=/>than (-) 10 degrees extension Baseline:  Goal status: INITIAL  2.  Improve tissue extensibility with patient to move for transfers and transitional  Baseline:  Goal status: INITIAL  3.  Patient reports decreased pain and tightness by 50-70% after physical activity  Baseline:  Goal status: INITIAL  4.  Independent in HEP including aquatic therapy as indicated  Baseline:  Goal status: INITIAL  5.  Improve functional limitation score to 62 Baseline:  Goal status: INITIAL   PLAN: Patient will work on initial HEP for ~ 2 weeks and schedule a f/u appointment to progress exercises to include LE stretching, strengthening and core stabilization.   PT FREQUENCY: 2x/week  PT DURATION: 8 weeks  PLANNED INTERVENTIONS:  Therapeutic exercises, Therapeutic activity, Neuromuscular re-education, Balance training, Gait training, Patient/Family education, Self Care, Joint mobilization, Stair training, Aquatic Therapy, Dry Needling, Electrical stimulation, Cryotherapy, Moist heat, Taping, Vasopneumatic device, Ultrasound, Ionotophoresis '4mg'$ /ml Dexamethasone, Manual therapy, and Re-evaluation  PLAN FOR NEXT SESSION: review and progress exercise program; manual work, DN, modalities as indicated - will hold PT and patient will call with any questions or problems.    Everardo All, PT, MPH  03/05/2022, 2:03 PM

## 2022-04-11 NOTE — Therapy (Addendum)
PHYSICAL THERAPY TREATMENT NOTE AND DISCHARGE SUMMARY   PHYSICAL THERAPY DISCHARGE SUMMARY  Visits from Start of Care: 3  Current functional level related to goals / functional outcomes: See progress notes    Remaining deficits: No known deficits - patient needs to continue with independent HEP    Education / Equipment: HEP    Patient agrees to discharge. Patient goals were met. Patient is being discharged due to meeting the stated rehab goals.  [name] P. Helene Kelp PT, MPH 04/12/22 2:55 PM   [facility] [address] [place], [place], [address] Phone: [phone]   Fax:  [phone]  Patient Details  Name: Jonathon Taylor MRN: [id number] Date of Birth: Jun 25, 1945 Referring Provider:  Luetta Nutting, DO  Encounter Date: 03/05/2022 Continued with therapeutic exercises per previous visit, correction and modifying exercises.  Continued with education for HEP   See prior treatment note for exercises and list for HEP   Treatment time: 40 min  Charges: therapeutic exercise: 3 units  Visit charge   Everardo All, PT 04/11/2022, 2:39 PM  [facility] [address] [place], [place], [address] Phone: [phone]   Fax:  [phone]

## 2022-07-04 ENCOUNTER — Ambulatory Visit: Payer: Medicare Other | Admitting: Dermatology

## 2022-10-26 ENCOUNTER — Telehealth: Payer: Self-pay | Admitting: Family Medicine

## 2022-10-26 NOTE — Telephone Encounter (Signed)
Patient is called in about doing Genetic testing, please advise. States that his Sister has ACM and wants to test for that.

## 2022-10-31 ENCOUNTER — Encounter: Payer: Self-pay | Admitting: Family Medicine

## 2022-10-31 ENCOUNTER — Ambulatory Visit (INDEPENDENT_AMBULATORY_CARE_PROVIDER_SITE_OTHER): Payer: Medicare Other | Admitting: Family Medicine

## 2022-10-31 VITALS — BP 139/77 | HR 58 | Ht 68.0 in | Wt 152.0 lb

## 2022-10-31 DIAGNOSIS — Z8249 Family history of ischemic heart disease and other diseases of the circulatory system: Secondary | ICD-10-CM | POA: Diagnosis not present

## 2022-11-01 DIAGNOSIS — Z8249 Family history of ischemic heart disease and other diseases of the circulatory system: Secondary | ICD-10-CM | POA: Insufficient documentation

## 2022-11-01 NOTE — Progress Notes (Signed)
Jonathon Taylor - 77 y.o. male MRN [id number]  Date of birth: 1945/11/18  Subjective Chief Complaint  Patient presents with   Genetic Evaluation    HPI Jonathon Taylor is a 77 y.o. male here today to discuss the genetic predisposition to arrhythmia.  He reports that his sister recently had testing and genetic counseling for familial arrhythmogenic disorder.  She did test positive for a couple of genes for arrhythmogenic disorder.  It was recommended that siblings be tested as well.  She had testing completed through [facility] and he would like to have testing ordered through them as well as her patient identifier will link to the current test that he needs to be tested for.  He has not had any symptoms.  ROS:  A comprehensive ROS was completed and negative except as noted per HPI  No Known Allergies  Past Medical History:  Diagnosis Date   Anemia    ON IRON FOR PRIOR ANEMIA R/T BLEEDING BEFORE TURBT    Basal cell carcinoma 05/23/1992   LEFT UPPER BACK NEAR SCAR   BCC (basal cell carcinoma of skin) 11/22/1993   BEHIND LEFT EAR tx EFUDEX   BCC (basal cell carcinoma of skin) 03/11/2003   RIGHT SHOULDER TX WITH BX   BCC (basal cell carcinoma of skin) 03/11/2003   LEFT SHOULDER TX WITH BX   BCC (basal cell carcinoma of skin) 11/24/2013   right forearm tx picato   BCC (basal cell carcinoma of skin) 04/19/2014   right shoulder inferior tx with bx   BCC (basal cell carcinoma of skin) 02/26/2018   right forearm tx cx3 38fu   BCC (basal cell carcinoma of skin)    GERD (gastroesophageal reflux disease)    H/O cardiac catheterization 2004   negative;Dr Viann Fish   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   Left Parotid Ear/Neck   Nodular basal cell carcinoma (BCC) 07/03/2021   Left Nasal Sidewall   Personal history of colonic polyps 08/12/2015   Prostate enlargement    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   left temple tx cx3 34fu   SCC (squamous cell carcinoma)  03/11/2003   right shoulder front tx with bx   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   top right shoulder tx with bx   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   below left ear tx with bx   SCC (squamous cell carcinoma) 03/03/2008   left temple tx efudex   SCC (squamous cell carcinoma) 11/14/2011   upper right arm tx with bx   SCC (squamous cell carcinoma) 11/24/2013   right sideburn picato cx3 67fu   SCC (squamous cell carcinoma) 02/26/2018   left upper arm cx3 52fu   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   Left Hand Psoterior (in situ)   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   Right Forearm Posterior (well diff)   SCCA (squamous cell carcinoma) of skin 08/16/2020   Left Upper Posterior Arm (well diff) treated after biopsy   SCCA (squamous cell carcinoma) of skin 01/16/2021   Left Hand - posterior (well diff)   SCCA (squamous cell carcinoma) of skin 07/03/2021   Mid Tip of Nose (well diff with superficial inflitration)   Seasonal allergies    Skin cancer    Dr Janalyn Harder   Squamous cell carcinoma of skin 11/22/1993   right temple tx efudex    Past Surgical History:  Procedure Laterality Date   arthroscopic knee surgery Left 1995   BASAL  CELL CARCINOMA EXCISION     2000-2017   CARDIAC CATHETERIZATION  02/27/2003   per patient " i had spot on symptoms of a heart attack and a specialist, dr Viann Fish ,  was brought in and after i had  the cath ; they said it was bad reflux . " per patient , after changing diet , no reccurrence of symptoms    COLONOSCOPY     INGUINAL HERNIA REPAIR Bilateral 03/12/2017   Procedure: LAPAROSCOPIC BILATERAL INGUINAL HERNIA WITH  MESH;  Surgeon: Berna Bue, MD;  Location: [facility];  Service: General;  Laterality: Bilateral;   INSERTION OF MESH N/A 03/12/2017   Procedure: INSERTION OF MESH;  Surgeon: Berna Bue, MD;  Location: [facility];  Service: General;  Laterality: N/A;   TRANSURETHRAL RESECTION OF  BLADDER TUMOR N/A 07/08/2017   Procedure: CYSTO CLOT EVACUATION AND FULGRATION;  Surgeon: Bjorn Pippin, MD;  Location: [facility];  Service: Urology;  Laterality: N/A;   XI ROBOTIC ASSISTED SIMPLE PROSTATECTOMY N/A 09/19/2017   Procedure: XI ROBOTIC ASSISTED SIMPLE PROSTATECTOMY;  Surgeon: Malen Gauze, MD;  Location: [facility];  Service: Urology;  Laterality: N/A;    Social History   Socioeconomic History   Marital status: Married    Spouse name: Not on file   Number of children: Not on file   Years of education: Not on file   Highest education level: Not on file  Occupational History   Occupation: Professor  Tobacco Use   Smoking status: Never   Smokeless tobacco: Former  Building services engineer status: Never Used  Substance and Sexual Activity   Alcohol use: Yes    Alcohol/week: 0.0 standard drinks of alcohol    Comment: 2 glasses of red wine nightly   Drug use: No   Sexual activity: Yes  Other Topics Concern   Not on file  Social History Narrative   Married. Education: Other. Exercise: Bike/light weights everyday for 45-60 minutes.   Social Determinants of Health   Financial Resource Strain: Not on file  Food Insecurity: Not on file  Transportation Needs: Not on file  Physical Activity: Not on file  Stress: Not on file  Social Connections: Not on file    Family History  Problem Relation Age of Onset   Heart disease Mother    Heart disease Father    Heart disease Sister    Heart disease Maternal Grandfather    Colon cancer Neg Hx    Colon polyps Neg Hx    Esophageal cancer Neg Hx    Rectal cancer Neg Hx    Stomach cancer Neg Hx     Health Maintenance  Topic Date Due   Medicare Annual Wellness (AWV)  09/07/2021   COVID-19 Vaccine (6 - 2023-24 season) 03/03/2022   INFLUENZA VACCINE  10/25/2022   Colonoscopy  11/08/2023   DTaP/Tdap/Td (2 - Td or Tdap) 05/02/2026   Pneumonia Vaccine 14+ Years old  Completed   Hepatitis C Screening  Completed   Zoster Vaccines-  Shingrix  Completed   HPV VACCINES  Aged Out     ----------------------------------------------------------------------------------------------------------------------------------------------------------------------------------------------------------------- Physical Exam BP 139/77 (BP Location: Left Arm, Patient Position: Sitting, Cuff Size: Normal)   Pulse (!) 58   Ht 5\' 8"  (1.727 m)   Wt 152 lb (68.9 kg)   SpO2 97%   BMI 23.11 kg/m   Physical Exam Constitutional:      Appearance: Normal appearance.  Cardiovascular:     Rate and Rhythm: Normal  rate and regular rhythm.  Pulmonary:     Effort: Pulmonary effort is normal.     Breath sounds: Normal breath sounds.  Neurological:     General: No focal deficit present.     Mental Status: He is alert.  Psychiatric:        Mood and Affect: Mood normal.        Behavior: Behavior normal.     ------------------------------------------------------------------------------------------------------------------------------------------------------------------------------------------------------------------- Assessment and Plan  Family history of arrhythmia Sister with history of arrhythmia and tested positive for DSP gene mutation.  I was able to find testing that he needed to have completed through the [facility] website using his sisters patient identifier number.  I have recommended that he go ahead and have this completed.  If this returns positive we will need to get him set up with cardiologist, obtain echocardiogram, Holter monitor and will probably need cardiac MRI.   No orders of the defined types were placed in this encounter.   No follow-ups on file.    This visit occurred during the SARS-CoV-2 public health emergency.  Safety protocols were in place, including screening questions prior to the visit, additional usage of staff PPE, and extensive cleaning of exam room while observing appropriate contact time as indicated for  disinfecting solutions.

## 2022-11-01 NOTE — Assessment & Plan Note (Signed)
Sister with history of arrhythmia and tested positive for DSP gene mutation.  I was able to find testing that he needed to have completed through the [facility] website using his sisters patient identifier number.  I have recommended that he go ahead and have this completed.  If this returns positive we will need to get him set up with cardiologist, obtain echocardiogram, Holter monitor and will probably need cardiac MRI.

## 2022-11-02 ENCOUNTER — Encounter: Payer: Self-pay | Admitting: Family Medicine

## 2022-11-02 DIAGNOSIS — Z8249 Family history of ischemic heart disease and other diseases of the circulatory system: Secondary | ICD-10-CM

## 2022-11-02 DIAGNOSIS — Z1589 Genetic susceptibility to other disease: Secondary | ICD-10-CM

## 2022-12-06 NOTE — Telephone Encounter (Signed)
Test results placed in Jonathon Taylor's box to be forwarded to [place].

## 2022-12-10 NOTE — Telephone Encounter (Signed)
They were from an outside lab, I can print a copy.  The results were in Jonathon Taylor's box from last week.

## 2022-12-11 NOTE — Telephone Encounter (Signed)
Updated reports placed in Panya's box

## 2022-12-27 ENCOUNTER — Encounter: Payer: Self-pay | Admitting: Cardiology

## 2022-12-27 ENCOUNTER — Ambulatory Visit: Payer: Medicare Other | Attending: Cardiology | Admitting: Cardiology

## 2022-12-27 ENCOUNTER — Ambulatory Visit (INDEPENDENT_AMBULATORY_CARE_PROVIDER_SITE_OTHER): Payer: Medicare Other

## 2022-12-27 VITALS — BP 122/74 | HR 62 | Ht 68.0 in | Wt 150.2 lb

## 2022-12-27 DIAGNOSIS — I428 Other cardiomyopathies: Secondary | ICD-10-CM

## 2022-12-27 DIAGNOSIS — Z01812 Encounter for preprocedural laboratory examination: Secondary | ICD-10-CM | POA: Insufficient documentation

## 2022-12-27 DIAGNOSIS — R002 Palpitations: Secondary | ICD-10-CM | POA: Diagnosis not present

## 2022-12-27 DIAGNOSIS — R0609 Other forms of dyspnea: Secondary | ICD-10-CM | POA: Insufficient documentation

## 2022-12-27 DIAGNOSIS — Z148 Genetic carrier of other disease: Secondary | ICD-10-CM | POA: Diagnosis not present

## 2022-12-27 DIAGNOSIS — R072 Precordial pain: Secondary | ICD-10-CM | POA: Insufficient documentation

## 2022-12-27 DIAGNOSIS — Z8249 Family history of ischemic heart disease and other diseases of the circulatory system: Secondary | ICD-10-CM | POA: Insufficient documentation

## 2022-12-27 DIAGNOSIS — R42 Dizziness and giddiness: Secondary | ICD-10-CM | POA: Diagnosis not present

## 2022-12-27 MED ORDER — METOPROLOL TARTRATE 25 MG PO TABS: 25.0000 mg | ORAL_TABLET | ORAL | 0 refills | Status: AC

## 2022-12-27 NOTE — Progress Notes (Unsigned)
Enrolled for [employer] to mail a ZIO XT long term holter monitor to the patients address on file.  

## 2022-12-27 NOTE — Patient Instructions (Addendum)
Medication Instructions:  The current medical regimen is effective;  continue present plan and medications.  *If you need a refill on your cardiac medications before your next appointment, please call your pharmacy*   Lab Work: Please have blood work today   (H/H, BMP) If you have labs (blood work) drawn today and your tests are completely normal, you will receive your results only by: MyChart Message (if you have MyChart) OR A paper copy in the mail If you have any lab test that is abnormal or we need to change your treatment, we will call you to review the results.   Testing/Procedures: Christena Deem- Long Term Monitor Instructions  Your physician has requested you wear a ZIO patch monitor for 14 days.  This is a single patch monitor. [employer] supplies one patch monitor per enrollment. Additional stickers are not available. Please do not apply patch if you will be having a Nuclear Stress Test,  Echocardiogram, Cardiac CT, MRI, or Chest Xray during the period you would be wearing the  monitor. The patch cannot be worn during these tests. You cannot remove and re-apply the  ZIO XT patch monitor.  Your ZIO patch monitor will be mailed 3 day USPS to your address on file. It may take 3-5 days  to receive your monitor after you have been enrolled.  Once you have received your monitor, please review the enclosed instructions. Your monitor  has already been registered assigning a specific monitor serial # to you.  Billing and Patient Assistance Program Information  We have supplied [employer] with any of your insurance information on file for billing purposes. [employer] offers a sliding scale Patient Assistance Program for patients that do not have  insurance, or whose insurance does not completely cover the cost of the ZIO monitor.  You must apply for the Patient Assistance Program to qualify for this discounted rate.  To apply, please call [employer] at [phone], select option 4, select option 2,  ask to apply for  Patient Assistance Program. Meredeth Ide will ask your household income, and how many people  are in your household. They will quote your out-of-pocket cost based on that information.  [employer] will also be able to set up a 51-month, interest-free payment plan if needed.  Applying the monitor   Shave hair from upper left chest.  Hold abrader disc by orange tab. Rub abrader in 40 strokes over the upper left chest as  indicated in your monitor instructions.  Clean area with 4 enclosed alcohol pads. Let dry.  Apply patch as indicated in monitor instructions. Patch will be placed under collarbone on left  side of chest with arrow pointing upward.  Rub patch adhesive wings for 2 minutes. Remove white label marked "1". Remove the white  label marked "2". Rub patch adhesive wings for 2 additional minutes.  While looking in a mirror, press and release button in center of patch. A small green light will  flash 3-4 times. This will be your only indicator that the monitor has been turned on.  Do not shower for the first 24 hours. You may shower after the first 24 hours.  Press the button if you feel a symptom. You will hear a small click. Record Date, Time and  Symptom in the Patient Logbook.  When you are ready to remove the patch, follow instructions on the last 2 pages of Patient  Logbook. Stick patch monitor onto the last page of Patient Logbook.  Place Patient Logbook in the blue and  white box. Use locking tab on box and tape box closed  securely. The blue and white box has prepaid postage on it. Please place it in the mailbox as  soon as possible. Your physician should have your test results approximately 7 days after the  monitor has been mailed back to [facility].  Call [facility] Customer Care at [phone] if you have questions regarding  your ZIO XT patch monitor. Call them immediately if you see an orange light blinking on your  monitor.  If your monitor falls off  in less than 4 days, contact our Monitor department at [phone].  If your monitor becomes loose or falls off after 4 days call [employer] at [phone] for  suggestions on securing your monitor    Your cardiac CT will be scheduled at:   [facility] [address] [place], [place] [address] [phone]  Please arrive at the [facility] and Children's Entrance (Entrance C2) of [facility] 30 minutes prior to test start time. You can use the FREE valet parking offered at entrance C (encouraged to control the heart rate for the test)  Proceed to the [facility] Radiology Department (first floor) to check-in and test prep.  All radiology patients and guests should use entrance C2 at [facility], accessed from [facility], even though the hospital's physical address listed is [address].     Please follow these instructions carefully (unless otherwise directed):  An IV will be required for this test and Nitroglycerin will be given.  Hold all erectile dysfunction medications at least 3 days (72 hrs) prior to test. (Ie viagra, cialis, sildenafil, tadalafil, etc)   On the Night Before the Test: Be sure to Drink plenty of water. Do not consume any caffeinated/decaffeinated beverages or chocolate 12 hours prior to your test. Do not take any antihistamines 12 hours prior to your test.  On the Day of the Test: Drink plenty of water until 1 hour prior to the test. Do not eat any food 1 hour prior to test. You may take your regular medications prior to the test.  Take metoprolol (Lopressor) two hours prior to test. If you take Furosemide/Hydrochlorothiazide/Spironolactone, please HOLD on the morning of the test.      After the Test: Drink plenty of water. After receiving IV contrast, you may experience a mild flushed feeling. This is normal. On occasion, you may experience a mild rash up to 24 hours after the test. This is not  dangerous. If this occurs, you can take Benadryl 25 mg and increase your fluid intake. If you experience trouble breathing, this can be serious. If it is severe call 911 IMMEDIATELY. If it is mild, please call our office. If you take any of these medications: Glipizide/Metformin, Avandament, Glucavance, please do not take 48 hours after completing test unless otherwise instructed.  We will call to schedule your test 2-4 weeks out understanding that some insurance companies will need an authorization prior to the service being performed.   For more information and frequently asked questions, please visit our website : [email or web address]  For non-scheduling related questions, please contact the cardiac imaging nurse navigator should you have any questions/concerns: Cardiac Imaging Nurse Navigators Direct Office Dial: [phone]   For scheduling needs, including cancellations and rescheduling, please call [place], [phone].     You are scheduled for Cardiac MRI on ______________. Please arrive for your appointment at ______________ ( arrive 30-45 minutes prior to test start time). ?  [facility] [address] [place], [place] [address] [phone] Please take advantage of the free valet parking available at the [facility] and [employer] (Entrance C).  Proceed to the [facility] Radiology Department (First Floor) for check-in.   Magnetic resonance imaging (MRI) is a painless test that produces images of the inside of the body without using Xrays.  During an MRI, strong magnets and radio waves work together in a Data processing manager to form detailed images.   MRI images may provide more details about a medical condition than X-rays, CT scans, and ultrasounds can provide.  You may be given earphones to listen for instructions.  You may eat a light breakfast and take medications as ordered with the exception of furosemide, hydrochlorothiazide, or  spironolactone(fluid pill, other). Please avoid stimulants for 12 hr prior to test. (Ie. Caffeine, nicotine, chocolate, or antihistamine medications)  An IV will be inserted into one of your veins. Contrast material will be injected into your IV. It will leave your body through your urine within a day. You may be told to drink plenty of fluids to help flush the contrast material out of your system.  You will be asked to remove all metal, including: Watch, jewelry, and other metal objects including hearing aids, hair pieces and dentures. Also wearable glucose monitoring systems (ie. Freestyle Libre and Omnipods) (Braces and fillings normally are not a problem.)   TEST WILL TAKE APPROXIMATELY 1 HOUR  PLEASE NOTIFY SCHEDULING AT LEAST 24 HOURS IN ADVANCE IF YOU ARE UNABLE TO KEEP YOUR APPOINTMENT. [phone]  For more information and frequently asked questions, please visit our website : [email or web address]  Please call the Cardiac Imaging Nurse Navigators with any questions/concerns. [phone] Office     Follow-Up: At [facility], you and your health needs are our priority.  As part of our continuing mission to provide you with exceptional heart care, we have created designated Provider Care Teams.  These Care Teams include your primary Cardiologist (physician) and Advanced Practice Providers (APPs -  Physician Assistants and Nurse Practitioners) who all work together to provide you with the care you need, when you need it.  We recommend signing up for the patient portal called "MyChart".  Sign up information is provided on this After Visit Summary.  MyChart is used to connect with patients for Virtual Visits (Telemedicine).  Patients are able to view lab/test results, encounter notes, upcoming appointments, etc.  Non-urgent messages can be sent to your provider as well.   To learn more about what you can do with MyChart, go to [email or web address].    Your next  appointment:   1 year(s)  Provider:   Donato Schultz, MD

## 2022-12-27 NOTE — Progress Notes (Signed)
Cardiology Office Note:  .   Date:  12/27/2022  ID:  JAMESROBERT Taylor, DOB Jul 13, 1945, MRN [id number] PCP: Everrett Coombe, DO  [facility] Providers Cardiologist:  Donato Schultz, MD     History of Present Illness: .   Jonathon Taylor is a 77 y.o. male Discussed the use of AI scribe software for clinical note transcription with the patient, who gave verbal consent to proceed.  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 The patient's sister had a sudden cardiac death event at age 43, was resuscitated, and has since been living with an implanted defibrillator. The patient's mother, tested at age 57, does not carry the gene variant, suggesting it is inherited from the father's side.  The patient has not experienced any sudden cardiac events or fainting episodes. However, he reported brief episodes of chest discomfort during high-intensity physical activities, such as biking uphill. These episodes were characterized by a sensation of the heart 'pounding out of the chest' and subsequent dizziness if he attempted to walk too quickly after the activity. The patient has no history of smoking, diabetes, or hypertension.  In 2004, the patient underwent a heart catheterization due to chest pain radiating to the arm. The results were normal and the pain was attributed to acid reflux. The patient has been managing the reflux symptoms with lifestyle modifications and medication.  The patient's son, aged 70, is also at risk of carrying the gene variant and is currently leading an active lifestyle involving high-intensity endurance exercise. The patient is concerned about the potential implications for his son's health and lifestyle if he tests positive for the gene variant.       Family History - Sister  with arrhythmogenic right ventricular dysplasia - Mother tested negative for arrhythmogenic right ventricular dysplasia   ROS: No bleeding no syncope  Studies Reviewed: Marland Kitchen   EKG Interpretation Date/Time:  Thursday December 27 2022 09:47:46 EDT Ventricular Rate:  62 PR Interval:  184 QRS Duration:  92 QT Interval:  406 QTC Calculation: 412 R Axis:   55  Text Interpretation: Normal sinus rhythm Normal ECG When compared with ECG of 08-Jul-2017 10:26, Premature ventricular complexes are no longer Present Nonspecific T wave abnormality no longer evident in Lateral leads Confirmed by Donato Schultz ([id number]) on 12/27/2022 10:22:37 AM    Results LABS DSP gene 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, heterozygous and pathogenic  DIAGNOSTIC Heart catheterization: No significant findings (02/2003) Risk Assessment/Calculations:            Physical Exam:   VS:  BP 122/74   Pulse 62   Ht 5\' 8"  (1.727 m)   Wt 150 lb 3.2 oz (68.1 kg)   SpO2 96%   BMI 22.84 kg/m    Wt Readings from Last 3 Encounters:  12/27/22 150 lb 3.2 oz (68.1 kg)  10/31/22 152 lb (68.9 kg)  02/06/22 162 lb (73.5 kg)    GEN: Well nourished, well developed in no acute distress NECK: No JVD; No carotid bruits CARDIAC: RRR, no murmurs, no rubs, no gallops RESPIRATORY:  Clear to auscultation without rales, wheezing or rhonchi  ABDOMEN: Soft, non-tender, non-distended EXTREMITIES:  No edema; No deformity   ASSESSMENT AND PLAN: .    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 No personal history of sudden cardiac death or syncope. Family history of ARVC in sister with sudden cardiac death at age 63, now with an ejection fraction of 20%. -Order Zio patch for 14-day heart rhythm monitoring. -Order MRI for structural and functional assessment of the heart and detection of fibrofatty  infiltration -Order coronary CT scan to assess for potential coronary artery disease, has had chest pain, palpitations, SOB with heavy exertion.   Dizziness post-exercise Reports feeling dizzy after vigorous biking and transitioning to walking. Likely due to vasodilation and transient drop in blood pressure post-exercise. -Advise to hydrate well and rest briefly after vigorous exercise before transitioning to walking or standing.  Family risk 74 year old son in [place] potentially at risk due to positive ARVC gene in patient. -Consider genetic counseling for son to discuss potential testing and implications.              Signed, Donato Schultz, MD

## 2022-12-28 LAB — BASIC METABOLIC PANEL
BUN/Creatinine Ratio: 20 (ref 10–24)
BUN: 16 mg/dL (ref 8–27)
CO2: 24 mmol/L (ref 20–29)
Calcium: 9.3 mg/dL (ref 8.6–10.2)
Chloride: 103 mmol/L (ref 96–106)
Creatinine, Ser: 0.79 mg/dL (ref 0.76–1.27)
Glucose: 81 mg/dL (ref 70–99)
Potassium: 4.8 mmol/L (ref 3.5–5.2)
Sodium: 140 mmol/L (ref 134–144)
eGFR: 93 mL/min/{1.73_m2} (ref >59)

## 2022-12-28 LAB — HEMOGLOBIN AND HEMATOCRIT, BLOOD
Hematocrit: 44.3 % (ref 37.5–51.0)
Hemoglobin: 13.7 g/dL (ref 13.0–17.7)

## 2023-01-11 ENCOUNTER — Telehealth (HOSPITAL_COMMUNITY): Payer: Self-pay | Admitting: *Deleted

## 2023-01-11 NOTE — Telephone Encounter (Signed)
Patient calling about his upcoming cardiac imaging studies; pt verbalizes understanding of appt date/time, parking situation and where to check in, pre-test NPO status , and verified current allergies; name and call back number provided for further questions should they arise  Larey Brick RN Navigator Cardiac Imaging [facility] [phone] office [phone] cell  Patient is aware to arrive at 10:30 AM for his cardiac CT on Oct 21 and to arrive at 1:30 PM for his Cardiac MRI on Oct 22.   He denies claustrophobia but reports dental implants.

## 2023-01-14 ENCOUNTER — Ambulatory Visit (HOSPITAL_COMMUNITY)
Admission: RE | Admit: 2023-01-14 | Discharge: 2023-01-14 | Disposition: A | Discharge status: Home / Self Care | Payer: Medicare Other | Source: Ambulatory Visit | Attending: Cardiology | Admitting: Cardiology

## 2023-01-14 DIAGNOSIS — R072 Precordial pain: Secondary | ICD-10-CM

## 2023-01-14 DIAGNOSIS — Z8249 Family history of ischemic heart disease and other diseases of the circulatory system: Secondary | ICD-10-CM | POA: Diagnosis present

## 2023-01-14 DIAGNOSIS — R0609 Other forms of dyspnea: Secondary | ICD-10-CM | POA: Diagnosis not present

## 2023-01-14 MED ORDER — NITROGLYCERIN 0.4 MG SL SUBL
SUBLINGUAL_TABLET | SUBLINGUAL | Status: AC
  Filled 2023-01-14: qty 2

## 2023-01-14 MED ORDER — NITROGLYCERIN 0.4 MG SL SUBL
0.8000 mg | SUBLINGUAL_TABLET | Freq: Once | SUBLINGUAL | Status: AC
  Administered 2023-01-14: 0.8 mg via SUBLINGUAL

## 2023-01-14 MED ORDER — IOHEXOL 350 MG/ML SOLN
95.0000 mL | Freq: Once | INTRAVENOUS | Status: AC | PRN
  Administered 2023-01-14: 95 mL via INTRAVENOUS

## 2023-01-15 ENCOUNTER — Other Ambulatory Visit: Payer: Self-pay | Admitting: Cardiology

## 2023-01-15 ENCOUNTER — Ambulatory Visit (HOSPITAL_COMMUNITY)
Admission: RE | Admit: 2023-01-15 | Discharge: 2023-01-15 | Disposition: A | Discharge status: Home / Self Care | Payer: Medicare Other | Source: Ambulatory Visit | Attending: Cardiology | Admitting: Cardiology

## 2023-01-15 DIAGNOSIS — R0609 Other forms of dyspnea: Secondary | ICD-10-CM

## 2023-01-15 DIAGNOSIS — Z8249 Family history of ischemic heart disease and other diseases of the circulatory system: Secondary | ICD-10-CM | POA: Diagnosis present

## 2023-01-15 MED ORDER — GADOBUTROL 1 MMOL/ML IV SOLN
10.0000 mL | Freq: Once | INTRAVENOUS | Status: AC | PRN
  Administered 2023-01-15: 10 mL via INTRAVENOUS

## 2023-01-16 DIAGNOSIS — R0609 Other forms of dyspnea: Secondary | ICD-10-CM

## 2023-01-16 DIAGNOSIS — R002 Palpitations: Secondary | ICD-10-CM

## 2023-01-25 ENCOUNTER — Telehealth: Payer: Self-pay | Admitting: Family Medicine

## 2023-01-25 ENCOUNTER — Telehealth: Payer: Self-pay | Admitting: Cardiology

## 2023-01-25 NOTE — Telephone Encounter (Signed)
Spoke with pt who reports during fine.  No issues. He is calling because he wants his son to be tested for "ACM". Advised I have discussed this with Dr Anne Fu who does not feel comfortable ordering this.  Suggested pt contact Dr Ashley Royalty who ordered the testing for himself.   Pt's sister Owais Pruett (1/28[birth year]/1954) her MD at [facility] is Dr Wynonia Sours - cardiomyopathy specialist.  Phone # [phone] to discuss cases if necessary.

## 2023-01-25 NOTE — Telephone Encounter (Signed)
Pt called asking to speak with Orlie Dakin, RN about genetics testing they discussed for pt's son. Please advise.

## 2023-01-25 NOTE — Telephone Encounter (Signed)
Patient came into the office wanting you to order his son,Turner Morison (DOB: 01/02/93) a genetic testing fro his son as well. Patient was advised that you wouldn't be able to since the son is not under you care. He has dropped paperwork and handwritten info sheet and it has been placed in your box.

## 2023-01-25 NOTE — Telephone Encounter (Signed)
Pt will be completing his zio soon and send it in.  He is aware I will be faxing all information together as requested once zio results are received.

## 2023-01-28 NOTE — Telephone Encounter (Signed)
Reviewed paperwork.  Since his son is not under my care I am unable to complete this for him.

## 2023-01-31 NOTE — Telephone Encounter (Signed)
LVM advising pt of Dr. Ashley Royalty determination concerning documentation for his son, who is not our patient. Contact information provided for questions.

## 2023-02-11 ENCOUNTER — Encounter: Payer: Medicare Other | Admitting: Family Medicine

## 2023-02-12 ENCOUNTER — Ambulatory Visit (INDEPENDENT_AMBULATORY_CARE_PROVIDER_SITE_OTHER): Payer: Medicare Other | Admitting: Family Medicine

## 2023-02-12 ENCOUNTER — Encounter: Payer: Self-pay | Admitting: Family Medicine

## 2023-02-12 VITALS — BP 121/69 | HR 64 | Temp 98.4°F | Resp 18 | Ht 68.0 in | Wt 153.0 lb

## 2023-02-12 DIAGNOSIS — R2689 Other abnormalities of gait and mobility: Secondary | ICD-10-CM | POA: Diagnosis not present

## 2023-02-12 DIAGNOSIS — Z0000 Encounter for general adult medical examination without abnormal findings: Secondary | ICD-10-CM

## 2023-02-12 NOTE — Progress Notes (Signed)
Subjective:   Jonathon Taylor is a 77 y.o. male who presents for Medicare Annual/Subsequent preventive examination.  Visit Complete: In person  Patient Medicare AWV questionnaire was completed by the patient on 02/12/23; I have confirmed that all information answered by patient is correct and no changes since this date.  Cardiac Risk Factors include: advanced age (>68men, >17 women);male gender     Objective:    Today's Vitals   02/12/23 1543  BP: 121/69  Pulse: 64  Resp: 18  Temp: 98.4 F (36.9 C)  TempSrc: Oral  SpO2: 97%  Weight: 153 lb (69.4 kg)  Height: 5\' 8"  (1.727 m)   Body mass index is 23.26 kg/m.     02/12/2023    3:59 PM 02/07/2022    9:34 AM 09/19/2017    4:47 PM 09/19/2017    6:23 AM 09/16/2017    2:31 PM 07/08/2017   10:50 AM 07/08/2017   10:26 AM  Advanced Directives  Does Patient Have a Medical Advance Directive? Yes Yes Yes Yes Yes No No  Type of Estate agent of [place];Living will Healthcare Power of [place];Living will Living will;Healthcare Power of Attorney Living will Living will    Does patient want to make changes to medical advance directive? No - Patient declined  No - Patient declined No - Patient declined No - Patient declined    Copy of Healthcare Power of Attorney in Chart?  No - copy requested No - copy requested      Would patient like information on creating a medical advance directive?      No - Patient declined No - Patient declined    Current Medications (verified) Outpatient Encounter Medications as of 02/12/2023  Medication Sig   triazolam (HALCION) 0.25 MG tablet Take 0.25 mg by mouth at bedtime as needed for sleep.   metoprolol tartrate (LOPRESSOR) 25 MG tablet Take 1 tablet (25 mg total) by mouth as directed. Take one tablet 2 hours before your cardiac CT (Patient not taking: Reported on 02/12/2023)   No facility-administered encounter medications on file as of 02/12/2023.    Allergies  (verified) Patient has no known allergies.   History: Past Medical History:  Diagnosis Date   Anemia    ON IRON FOR PRIOR ANEMIA R/T BLEEDING BEFORE TURBT    Basal cell carcinoma 05/23/1992   LEFT UPPER BACK NEAR SCAR   BCC (basal cell carcinoma of skin) 11/22/1993   BEHIND LEFT EAR tx EFUDEX   BCC (basal cell carcinoma of skin) 03/11/2003   RIGHT SHOULDER TX WITH BX   BCC (basal cell carcinoma of skin) 03/11/2003   LEFT SHOULDER TX WITH BX   BCC (basal cell carcinoma of skin) 11/24/2013   right forearm tx picato   BCC (basal cell carcinoma of skin) 04/19/2014   right shoulder inferior tx with bx   BCC (basal cell carcinoma of skin) 02/26/2018   right forearm tx cx3 79fu   BCC (basal cell carcinoma of skin)    GERD (gastroesophageal reflux disease)    H/O cardiac catheterization 2004   negative;Dr Viann Fish   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   Left Parotid Ear/Neck   Nodular basal cell carcinoma (BCC) 07/03/2021   Left Nasal Sidewall   Personal history of colonic polyps 08/12/2015   Prostate enlargement    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   left temple tx cx3 23fu   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   right shoulder front tx with bx  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   top right shoulder tx with bx   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   below left ear tx with bx   SCC (squamous cell carcinoma) 03/03/2008   left temple tx efudex   SCC (squamous cell carcinoma) 11/14/2011   upper right arm tx with bx   SCC (squamous cell carcinoma) 11/24/2013   right sideburn picato cx3 16fu   SCC (squamous cell carcinoma) 02/26/2018   left upper arm cx3 78fu   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   Left Hand Psoterior (in situ)   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   Right Forearm Posterior (well diff)   SCCA (squamous cell carcinoma) of skin 08/16/2020   Left Upper Posterior Arm (well diff) treated after biopsy    SCCA (squamous cell carcinoma) of skin 01/16/2021   Left Hand - posterior (well diff)   SCCA (squamous cell carcinoma) of skin 07/03/2021   Mid Tip of Nose (well diff with superficial inflitration)   Seasonal allergies    Skin cancer    Dr Janalyn Harder   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 " per patient , after changing diet , no reccurrence of symptoms    COLONOSCOPY     INGUINAL HERNIA REPAIR Bilateral 03/12/2017   Procedure: LAPAROSCOPIC BILATERAL INGUINAL HERNIA WITH  MESH;  Surgeon: Berna Bue, MD;  Location: [facility];  Service: General;  Laterality: Bilateral;   INSERTION OF MESH N/A 03/12/2017   Procedure: INSERTION OF MESH;  Surgeon: Berna Bue, MD;  Location: [facility];  Service: General;  Laterality: N/A;   TRANSURETHRAL RESECTION OF BLADDER TUMOR N/A 07/08/2017   Procedure: CYSTO CLOT EVACUATION AND FULGRATION;  Surgeon: Bjorn Pippin, MD;  Location: [facility];  Service: Urology;  Laterality: N/A;   XI ROBOTIC ASSISTED SIMPLE PROSTATECTOMY N/A 09/19/2017   Procedure: XI ROBOTIC ASSISTED SIMPLE PROSTATECTOMY;  Surgeon: Malen Gauze, MD;  Location: [facility];  Service: Urology;  Laterality: N/A;   Family History  Problem Relation Age of Onset   Heart disease Mother    Heart disease Father    Heart disease Sister    Heart disease Maternal Grandfather    Colon cancer Neg Hx    Colon polyps Neg Hx    Esophageal cancer Neg Hx    Rectal cancer Neg Hx    Stomach cancer Neg Hx    Social History   Socioeconomic History   Marital status: Married    Spouse name: Not on file   Number of children:  1   Years of education: 20   Highest education level: Doctorate  Occupational History   Occupation: Professor  Tobacco Use   Smoking status: Never   Smokeless tobacco: Former  Building services engineer status: Never Used  Substance and Sexual Activity   Alcohol use: Yes    Alcohol/week: 0.0 standard drinks of alcohol    Comment: 2 glasses of red wine nightly   Drug use: No   Sexual activity: Yes  Other Topics Concern   Not on file  Social History Narrative   Married. Education: Other. Exercise: Bike/light weights everyday for 45-60 minutes.   Social Determinants of Health   Financial Resource Strain: Low Risk  (02/12/2023)   Overall Financial Resource Strain (CARDIA)    Difficulty of Paying Living Expenses: Not hard at all  Food Insecurity: No Food Insecurity (02/12/2023)   Hunger Vital Sign    Worried About Running Out of Food in the Last Year: Never true    Ran Out of Food in the Last Year: Never true  Transportation Needs: No Transportation Needs (02/12/2023)   PRAPARE - Administrator, Civil Service (Medical): No    Lack of Transportation (Non-Medical): No  Physical Activity: Sufficiently Active (02/12/2023)   Exercise Vital Sign    Days of Exercise per Week: 7 days    Minutes of Exercise per Session: 30 min  Stress: No Stress Concern Present (02/12/2023)   [employer] of Occupational Health - Occupational Stress Questionnaire    Feeling of Stress : Not at all  Social Connections: Moderately Isolated (02/12/2023)   Social Connection and Isolation Panel [NHANES]    Frequency of Communication with Friends and Family: More than three times a week    Frequency of Social Gatherings with Friends and Family: Once a week    Attends Religious Services: Never    Database administrator or Organizations: No    Attends Engineer, structural: Never    Marital Status: Married    Tobacco Counseling Counseling given: Not Answered   Clinical  Intake:  Pre-visit preparation completed: No  Pain : No/denies pain     BMI - recorded: 23.2 Nutritional Status: BMI of 19-24  Normal Nutritional Risks: None Diabetes: No  How often do you need to have someone help you when you read instructions, pamphlets, or other written materials from your doctor or pharmacy?: 1 - Never What is the last grade level you completed in school?: 20  Interpreter Needed?: No      Activities of Daily Living    02/12/2023    3:47 PM  In your present state of health, do you have any difficulty performing the following activities:  Hearing? 0  Vision? 0  Difficulty concentrating or making decisions? 0  Walking or climbing stairs? 0  Dressing or bathing? 0  Doing errands, shopping? 0  Preparing Food and eating ? N  Using the Toilet? N  In the past six months, have you accidently leaked urine? N  Do you have problems with loss of bowel control? N  Managing your Medications? N  Managing your Finances? N  Housekeeping or managing your Housekeeping? N    Patient Care Team: Everrett Coombe, DO as PCP - General (Family Medicine) Jake Bathe, MD as PCP - Cardiology (Cardiology) Janalyn Harder, MD (Inactive) as Consulting Physician (Dermatology)  Indicate any recent Medical Services you may have received from other than [facility] providers in the past year (date may be approximate).     Assessment:   This is a routine wellness examination for Jonathon Taylor.  Hearing/Vision screen Hearing Screening - Comments:: Unable to test, grossly intact Vision Screening - Comments:: Unable to test, grossly intact. Wears glasses    Goals Addressed             This Visit's Progress    Mobility and Independence Optimized       Evidence-based guidance:  Refer to physical therapy or occupational therapy for  assessment and individualized program.  Provide therapy that may include functional task training, balance, active or passive exercise, spasticity  management, assistive device training, cardiorespiratory fitness, Pilates and telerehabilitation services.  Identify barriers to participation in therapy or exercise such as pain with activity, anticipated or imagined pain, transportation, depression or fear.  Work with patient and family to develop self-management plan to remove barriers.  Refer to speech/language pathologist to assess and treat speech/language deficit and swallowing impairment.  Periodically review ability to perform activities of daily living and required amount of assistance needed for safety, optimal independence and self-care.  Encourage appropriate vocational or educational counseling for re-entering the community, workplace or school; consider a driving evaluation.  Assist patient to advocate for necessary adaptations to the work or school environment.  Refer to employer for information about FMLA (Family and Medical Leave Act), Short or Long-Term Disability and options for adjustments to work schedule, assignment or hours.   Notes:       Depression Screen    02/12/2023    4:02 PM 02/12/2023    3:58 PM 10/31/2022    4:05 PM 02/06/2022    2:51 PM 09/07/2020   11:01 AM 09/01/2019    9:43 AM 05/09/2018    9:08 AM  PHQ 2/9 Scores  PHQ - 2 Score 0 0 0 0 0 0 0  PHQ- 9 Score      1 0    Fall Risk    02/12/2023    4:00 PM 10/31/2022    4:05 PM 02/06/2022    2:51 PM 09/07/2020   11:01 AM 09/01/2019    9:43 AM  Fall Risk   Falls in the past year? 0 0 1 0 0  Number falls in past yr: 0 0 1 0 0  Injury with Fall? 0 0 0 0 0  Risk for fall due to : No Fall Risks No Fall Risks History of fall(s) No Fall Risks   Follow up  Falls evaluation completed Falls evaluation completed Falls evaluation completed     MEDICARE RISK AT HOME: Medicare Risk at Home Any stairs in or around the home?: Yes If so, are there any without handrails?: Yes Home free of loose throw rugs in walkways, pet beds, electrical cords, etc?: Yes Adequate  lighting in your home to reduce risk of falls?: Yes Life alert?: No Use of a cane, walker or w/c?: No Grab bars in the bathroom?: Yes Shower chair or bench in shower?: No (will add one when needed.) Elevated toilet seat or a handicapped toilet?: Yes  TIMED UP AND GO:  Was the test performed?  Yes  Length of time to ambulate 10 feet: 6 sec Gait steady and fast without use of assistive device    Cognitive Function:        02/12/2023    4:02 PM  6CIT Screen  What Year? 0 points  What month? 0 points  What time? 0 points  Count back from 20 0 points  Months in reverse 0 points  Repeat phrase 0 points  Total Score 0 points    Immunizations Immunization History  Administered Date(s) Administered   Fluad Quad(high Dose 65+) 11/21/2018   Influenza, High Dose Seasonal PF 01/08/2017   Influenza,inj,Quad PF,6+ Mos 11/26/2012, 01/17/2014   Influenza-Unspecified 01/10/2015, 01/10/2018, 01/06/2022, 12/10/2022   Moderna Sars-Covid-2 Vaccination 04/24/2019, 05/22/2019, 01/23/2020, 07/18/2020   Pfizer Covid-19 Vaccine Bivalent Booster 73yrs & up 12/10/2022   Pfizer(Comirnaty)Fall Seasonal Vaccine 12 years and older 01/06/2022   Pneumococcal  Conjugate-13 01/17/2014   Pneumococcal Polysaccharide-23 11/26/2012   Respiratory Syncytial Virus Vaccine,Recomb Aduvanted(Arexvy) 01/19/2022   Tdap 05/02/2016   Zoster Recombinant(Shingrix) 02/06/2018, 04/11/2018   Zoster, Live 01/17/2014    TDAP status: Up to date  Flu Vaccine status: Up to date  Pneumococcal vaccine status: Up to date  Covid-19 vaccine status: Completed vaccines  Qualifies for Shingles Vaccine? Yes   Zostavax completed Yes   Shingrix Completed?: Yes  Screening Tests Health Maintenance  Topic Date Due   COVID-19 Vaccine (7 - 2023-24 season) 02/04/2023   Colonoscopy  11/08/2023   Medicare Annual Wellness (AWV)  02/12/2024   DTaP/Tdap/Td (2 - Td or Tdap) 05/02/2026   Pneumonia Vaccine 50+ Years old  Completed    INFLUENZA VACCINE  Completed   Hepatitis C Screening  Completed   Zoster Vaccines- Shingrix  Completed   HPV VACCINES  Aged Out    Health Maintenance  Health Maintenance Due  Topic Date Due   COVID-19 Vaccine (7 - 2023-24 season) 02/04/2023    Colorectal cancer screening: Type of screening: Colonoscopy. Completed 11/07/2020. Repeat every aged out.   Lung Cancer Screening: (Low Dose CT Chest recommended if Age 60-80 years, 20 pack-year currently smoking OR have quit w/in 15years.) does not qualify.   Lung Cancer Screening Referral: n/a  Additional Screening:  Hepatitis C Screening: does qualify; Completed 05/02/2016  Vision Screening: Recommended annual ophthalmology exams for early detection of glaucoma and other disorders of the eye. Is the patient up to date with their annual eye exam?  Yes  Who is the provider or what is the name of the office in which the patient attends annual eye exams? Cataract specialist in [place] If pt is not established with a provider, would they like to be referred to a provider to establish care? No .   Dental Screening: Recommended annual dental exams for proper oral hygiene  Diabetic Foot Exam: n/a   Community Resource Referral / Chronic Care Management: CRR required this visit?  No   CCM required this visit?  No     Plan:     I have personally reviewed and noted the following in the patient's chart:   Medical and social history Use of alcohol, tobacco or illicit drugs  Current medications and supplements including opioid prescriptions. Patient is not currently taking opioid prescriptions. Functional ability and status Nutritional status Physical activity Advanced directives List of other physicians: Cherrie Distance, derm; Dr. Leone Payor, GI; Dr. Ronne Binning, urology; Dr. Anne Fu, cardiology  Hospitalizations, surgeries, and ER visits in previous 12 months Vitals Screenings to include cognitive, depression, and falls Referrals and  appointments: Physical therapy referral for balance training to avoid falls in the future. Will ask if there is deep breathing therapy to help with arrhythmia.   In addition, I have reviewed and discussed with patient certain preventive protocols, quality metrics, and best practice recommendations. A written personalized care plan for preventive services as well as general preventive health recommendations were provided to patient.     Novella Olive, FNP   02/12/2023   After Visit Summary: (In Person-Declined) Patient declined AVS at this time.  Follow-up with PCP as scheduled.

## 2023-02-17 NOTE — Therapy (Signed)
OUTPATIENT PHYSICAL THERAPY LOWER EXTREMITY AND BALANCE EVALUATION   Patient Name: Jonathon Taylor MRN: [id number] DOB:Dec 10, 1945, 77 y.o., male Today's Date: 02/17/2023  END OF SESSION:   Past Medical History:  Diagnosis Date   Anemia    ON IRON FOR PRIOR ANEMIA R/T BLEEDING BEFORE TURBT    Basal cell carcinoma 05/23/1992   LEFT UPPER BACK NEAR SCAR   BCC (basal cell carcinoma of skin) 11/22/1993   BEHIND LEFT EAR tx EFUDEX   BCC (basal cell carcinoma of skin) 03/11/2003   RIGHT SHOULDER TX WITH BX   BCC (basal cell carcinoma of skin) 03/11/2003   LEFT SHOULDER TX WITH BX   BCC (basal cell carcinoma of skin) 11/24/2013   right forearm tx picato   BCC (basal cell carcinoma of skin) 04/19/2014   right shoulder inferior tx with bx   BCC (basal cell carcinoma of skin) 02/26/2018   right forearm tx cx3 37fu   BCC (basal cell carcinoma of skin)    GERD (gastroesophageal reflux disease)    H/O cardiac catheterization 2004   negative;Dr Viann Fish   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   Left Parotid Ear/Neck   Nodular basal cell carcinoma (BCC) 07/03/2021   Left Nasal Sidewall   Personal history of colonic polyps 08/12/2015   Prostate enlargement    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   left temple tx cx3 99fu   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   right shoulder front tx with bx   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   top right shoulder tx with bx   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   below left ear tx with bx   SCC (squamous cell carcinoma) 03/03/2008   left temple tx efudex   SCC (squamous cell carcinoma) 11/14/2011   upper right arm tx with bx   SCC (squamous cell carcinoma) 11/24/2013   right sideburn picato cx3 25fu   SCC (squamous cell carcinoma) 02/26/2018   left upper arm cx3 68fu   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   Left Hand Psoterior (in situ)   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   Right  Forearm Posterior (well diff)   SCCA (squamous cell carcinoma) of skin 08/16/2020   Left Upper Posterior Arm (well diff) treated after biopsy   SCCA (squamous cell carcinoma) of skin 01/16/2021   Left Hand - posterior (well diff)   SCCA (squamous cell carcinoma) of skin 07/03/2021   Mid Tip of Nose (well diff with superficial inflitration)   Seasonal allergies    Skin cancer    Dr Janalyn Harder   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 " per patient , after changing diet , no reccurrence of symptoms    COLONOSCOPY     INGUINAL HERNIA REPAIR Bilateral 03/12/2017   Procedure: LAPAROSCOPIC BILATERAL INGUINAL HERNIA WITH  MESH;  Surgeon: Berna Bue, MD;  Location: [facility];  Service: General;  Laterality: Bilateral;   INSERTION OF MESH N/A 03/12/2017   Procedure: INSERTION OF MESH;  Surgeon:  Berna Bue, MD;  Location: [facility];  Service: General;  Laterality: N/A;   TRANSURETHRAL RESECTION OF BLADDER TUMOR N/A 07/08/2017   Procedure: CYSTO CLOT EVACUATION AND FULGRATION;  Surgeon: Bjorn Pippin, MD;  Location: [facility];  Service: Urology;  Laterality: N/A;   XI ROBOTIC ASSISTED SIMPLE PROSTATECTOMY N/A 09/19/2017   Procedure: XI ROBOTIC ASSISTED SIMPLE PROSTATECTOMY;  Surgeon: Malen Gauze, MD;  Location: [facility];  Service: Urology;  Laterality: N/A;   Patient Active Problem List   Diagnosis Date Noted   Balance problems 02/12/2023   Family history of arrhythmia 11/01/2022   COVID-19 03/31/2021   Chronic pain of both knees 09/11/2020   Encounter for Medicare annual wellness exam 09/01/2019   Elevated LDL  cholesterol level 10/17/2018   Low calcium levels 04/18/2018   Spondylolisthesis 08/29/2015   History of colonic polyps 08/12/2015   Insomnia 01/18/2014   RAD (reactive airway disease) 11/26/2012   Family history of cardiovascular disease 11/26/2012    PCP: Everrett Coombe, DO   REFERRING PROVIDER: Everrett Coombe, DO   REFERRING DIAG: R26.89 (ICD-10-CM) - Balance problems   THERAPY DIAG:  No diagnosis found.  Rationale for Evaluation and Treatment: Rehabilitation  ONSET DATE: ***  SUBJECTIVE:   SUBJECTIVE STATEMENT: ***  PERTINENT HISTORY: *** PAIN:  Are you having pain? {OPRCPAIN:27236}  PRECAUTIONS: {Therapy precautions:24002}  RED FLAGS: {PT Red Flags:29287}   WEIGHT BEARING RESTRICTIONS: No  FALLS:  Has patient fallen in last 6 months? {fallsyesno:27318}  LIVING ENVIRONMENT: Lives with: {[facility] lives with:25569::"lives with their family"} Lives in: {Lives in:25570} Stairs: {opstairs:27293} Has following equipment at home: {Assistive devices:23999}  OCCUPATION: ***  PLOF: {PLOF:24004}  PATIENT GOALS: ***  NEXT MD VISIT: ***  OBJECTIVE:  Note: Objective measures were completed at Evaluation unless otherwise noted.  DIAGNOSTIC FINDINGS: N/A  PATIENT SURVEYS:  ABC scale ***  COGNITION: Overall cognitive status: {cognition:24006}     SENSATION: {sensation:27233}  EDEMA:  {edema:24020}  MUSCLE LENGTH: Hamstrings: Right *** deg; Left *** deg Maisie Fus test: Right *** deg; Left *** deg  POSTURE: {posture:25561}  PALPATION: ***  LOWER EXTREMITY ROM:  {AROM/PROM:27142} ROM Right eval Left eval  Hip flexion    Hip extension    Hip abduction    Hip adduction    Hip internal rotation    Hip external rotation    Knee flexion    Knee extension    Ankle dorsiflexion    Ankle plantarflexion    Ankle inversion    Ankle eversion     (Blank rows = not tested)  LOWER EXTREMITY MMT:  MMT Right eval Left eval  Hip flexion    Hip  extension    Hip abduction    Hip adduction    Hip internal rotation    Hip external rotation    Knee flexion    Knee extension    Ankle dorsiflexion    Ankle plantarflexion    Ankle inversion    Ankle eversion     (Blank rows = not tested)  LOWER EXTREMITY SPECIAL TESTS:  {LEspecialtests:26242}  FUNCTIONAL TESTS:  5 times sit to stand: *** Timed up and go (TUG): *** Berg Balance Scale: *** Dynamic Gait Index: ***  GAIT: Distance walked: *** Assistive device utilized: {Assistive devices:23999} Level of assistance: {Levels of assistance:24026} Comments: ***   TODAY'S TREATMENT:  DATE: ***  02/18/23 See pt ed and HEP   PATIENT EDUCATION:  Education details: {Education details:27468}  Person educated: {Person educated:25204} Education method: {Education Method:25205} Education comprehension: {Education Comprehension:25206}  HOME EXERCISE PROGRAM: ***  ASSESSMENT:  CLINICAL IMPRESSION: Patient is a 77 y.o. male who was seen today for physical therapy evaluation and treatment for balance problems. ***   OBJECTIVE IMPAIRMENTS: {opptimpairments:25111}.   ACTIVITY LIMITATIONS: {activitylimitations:27494}  PARTICIPATION LIMITATIONS: {participationrestrictions:25113}  PERSONAL FACTORS: {Personal factors:25162} are also affecting patient's functional outcome.   REHAB POTENTIAL: {rehabpotential:25112}  CLINICAL DECISION MAKING: {clinical decision making:25114}  EVALUATION COMPLEXITY: {Evaluation complexity:25115}   GOALS: Goals reviewed with patient? Yes  SHORT TERM GOALS: Target date: {follow up:25551}   Patient will be independent with initial HEP. Baseline: *** Goal status: INITIAL  2.  Patient will demonstrate decreased fall risk by scoring < 25 sec on TUG. Baseline: *** Goal status: INITIAL  3.  Patient will be educated on  strategies to decrease risk of falls.  Baseline: *** Goal status: INITIAL   LONG TERM GOALS: Target date: {follow up:25551}  Patient will be independent with advanced/ongoing HEP to improve outcomes and carryover.  Baseline: *** Goal status: INITIAL  2.  Patient will be able to ambulate 600' with LRAD with good safety to access community.  Baseline: *** Goal status: INITIAL  3.  Patient will be able to step up/down curb safely with LRAD for safety with community ambulation.  Baseline: *** Goal status: INITIAL   4.  Patient will demonstrate decreased 5XSTS by 2-3 seconds showing improved functional strength. Baseline: *** Goal status: INITIAL  5.  Patient will demonstrate at least 19/24 on DGI to improve gait stability and reduce risk for falls. Baseline: *** Goal status: INITIAL  6.  Patient will score *** on Berg Balance test to demonstrate lower risk of falls. (MCID= 8 points) .  Baseline: *** Goal status: INITIAL}  7.  Patient will report *** on *** to demonstrate improved functional ability. Baseline: *** Goal status: INITIAL  8.  Patient will demonstrate gait speed of >/= 1.8 ft/sec (0.55 m/s) to be a safe limited community ambulator with decreased risk for recurrent falls.  Baseline: *** Goal status: INITIAL    PLAN:  PT FREQUENCY: {rehab frequency:25116}  PT DURATION: {rehab duration:25117}  PLANNED INTERVENTIONS: {rehab planned interventions:25118::"97110-Therapeutic exercises","97530- Therapeutic [phone]- Neuromuscular re-education","97535- Self VHQI","69629- Manual therapy"}  PLAN FOR NEXT SESSION: ***   Solon Palm, PT  02/17/2023, 4:24 PM

## 2023-02-18 ENCOUNTER — Other Ambulatory Visit: Payer: Self-pay

## 2023-02-18 ENCOUNTER — Encounter: Payer: Self-pay | Admitting: Physical Therapy

## 2023-02-18 ENCOUNTER — Ambulatory Visit: Payer: Medicare Other | Attending: Family Medicine | Admitting: Physical Therapy

## 2023-02-18 DIAGNOSIS — R2689 Other abnormalities of gait and mobility: Secondary | ICD-10-CM | POA: Diagnosis present

## 2023-02-18 DIAGNOSIS — R2681 Unsteadiness on feet: Secondary | ICD-10-CM | POA: Diagnosis not present

## 2023-02-27 ENCOUNTER — Ambulatory Visit: Payer: Medicare Other | Attending: Family Medicine

## 2023-02-27 DIAGNOSIS — R29898 Other symptoms and signs involving the musculoskeletal system: Secondary | ICD-10-CM | POA: Insufficient documentation

## 2023-02-27 DIAGNOSIS — M25561 Pain in right knee: Secondary | ICD-10-CM | POA: Diagnosis present

## 2023-02-27 DIAGNOSIS — G8929 Other chronic pain: Secondary | ICD-10-CM | POA: Diagnosis present

## 2023-02-27 DIAGNOSIS — R2681 Unsteadiness on feet: Secondary | ICD-10-CM | POA: Insufficient documentation

## 2023-02-27 DIAGNOSIS — M25562 Pain in left knee: Secondary | ICD-10-CM | POA: Insufficient documentation

## 2023-02-27 NOTE — Therapy (Signed)
Marland KitchenMarland KitchenMarland Kitchen........................................................................ OUTPATIENT PHYSICAL THERAPY LOWER EXTREMITY AND BALANCE TREATMENT   Patient Name: Jonathon Taylor MRN: [id number] DOB:[birth year], 77 y.o., male Today's Date: 02/27/2023  END OF SESSION:  PT End of Session - 02/27/23 1447     Visit Number 2    Date for PT Re-Evaluation 04/15/23    Authorization Type MCR    Progress Note Due on Visit 10    PT Start Time 1447    PT Stop Time 1535    PT Time Calculation (min) 48 min    Activity Tolerance Patient tolerated treatment well    Behavior During Therapy WFL for tasks assessed/performed             Past Medical History:  Diagnosis Date   Anemia    ON IRON FOR PRIOR ANEMIA R/T BLEEDING BEFORE TURBT    Basal cell carcinoma 05/23/1992   LEFT UPPER BACK NEAR SCAR   BCC (basal cell carcinoma of skin) 11/22/1993   BEHIND LEFT EAR tx EFUDEX   BCC (basal cell carcinoma of skin) 03/11/2003   RIGHT SHOULDER TX WITH BX   BCC (basal cell carcinoma of skin) 03/11/2003   LEFT SHOULDER TX WITH BX   BCC (basal cell carcinoma of skin) 11/24/2013   right forearm tx picato   BCC (basal cell carcinoma of skin) 04/19/2014   right shoulder inferior tx with bx   BCC (basal cell carcinoma of skin) 02/26/2018   right forearm tx cx3 40fu   BCC (basal cell carcinoma of skin)    GERD (gastroesophageal reflux disease)    H/O cardiac catheterization 2004   negative;Dr Viann Fish   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   Left Parotid Ear/Neck   Nodular basal cell carcinoma (BCC) 07/03/2021   Left Nasal Sidewall   Personal history of colonic polyps 08/12/2015   Prostate enlargement    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   left temple tx cx3 8fu   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   right shoulder front tx with bx   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   top right shoulder tx with bx   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   below left ear tx  with bx   SCC (squamous cell carcinoma) 03/03/2008   left temple tx efudex   SCC (squamous cell carcinoma) 11/14/2011   upper right arm tx with bx   SCC (squamous cell carcinoma) 11/24/2013   right sideburn picato cx3 64fu   SCC (squamous cell carcinoma) 02/26/2018   left upper arm cx3 34fu   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   Left Hand Psoterior (in situ)   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   Right Forearm Posterior (well diff)   SCCA (squamous cell carcinoma) of skin 08/16/2020   Left Upper Posterior Arm (well diff) treated after biopsy   SCCA (squamous cell carcinoma) of skin 01/16/2021   Left Hand - posterior (well diff)   SCCA (squamous cell carcinoma) of skin 07/03/2021   Mid Tip of Nose (well diff with superficial inflitration)   Seasonal allergies    Skin cancer    Dr Janalyn Harder   Squamous cell carcinoma of skin 11/22/1993   right temple tx efudex   Past Surgical History:  Procedure Laterality Date   arthroscopic knee surgery Left 1995   BASAL CELL CARCINOMA EXCISION     2000-2017   CARDIAC CATHETERIZATION  02/27/2003   per patient " i had spot on symptoms of a heart attack and  a specialist, dr Viann Fish ,  was brought in and after i had  the cath ; they said it was bad reflux . " per patient , after changing diet , no reccurrence of symptoms    COLONOSCOPY     INGUINAL HERNIA REPAIR Bilateral 03/12/2017   Procedure: LAPAROSCOPIC BILATERAL INGUINAL HERNIA WITH  MESH;  Surgeon: Berna Bue, MD;  Location: [facility];  Service: General;  Laterality: Bilateral;   INSERTION OF MESH N/A 03/12/2017   Procedure: INSERTION OF MESH;  Surgeon: Berna Bue, MD;  Location: [facility];  Service: General;  Laterality: N/A;   TRANSURETHRAL RESECTION OF BLADDER TUMOR N/A 07/08/2017   Procedure: CYSTO CLOT EVACUATION AND FULGRATION;  Surgeon: Bjorn Pippin, MD;  Location: [facility];  Service: Urology;  Laterality: N/A;   XI ROBOTIC ASSISTED SIMPLE  PROSTATECTOMY N/A 09/19/2017   Procedure: XI ROBOTIC ASSISTED SIMPLE PROSTATECTOMY;  Surgeon: Malen Gauze, MD;  Location: [facility];  Service: Urology;  Laterality: N/A;   Patient Active Problem List   Diagnosis Date Noted   Balance problems 02/12/2023   Family history of arrhythmia 11/01/2022   COVID-19 03/31/2021   Chronic pain of both knees 09/11/2020   Encounter for Medicare annual wellness exam 09/01/2019   Elevated LDL cholesterol level 10/17/2018   Low calcium levels 04/18/2018   Spondylolisthesis 08/29/2015   History of colonic polyps 08/12/2015   Insomnia 01/18/2014   RAD (reactive airway disease) 11/26/2012   Family history of cardiovascular disease 11/26/2012    PCP: Everrett Coombe, DO   REFERRING PROVIDER: Novella Olive, FNP   REFERRING DIAG: R26.89 (ICD-10-CM) - Balance problems   THERAPY DIAG:  Unsteadiness on feet  Chronic pain of right knee  Chronic pain of left knee  Other symptoms and signs involving the musculoskeletal system  Rationale for Evaluation and Treatment: Rehabilitation  ONSET DATE: Last MD appt  SUBJECTIVE:   SUBJECTIVE STATEMENT: Patient reports   PERTINENT HISTORY: Spondylolisthesis in lumbar spine PAIN:  Are you having pain? No  PRECAUTIONS: None  RED FLAGS: None   WEIGHT BEARING RESTRICTIONS: No  FALLS:  Has patient fallen in last 6 months? Yes. Number of falls 1 turned around in the garden and fell into a pile of leaves  LIVING ENVIRONMENT: Lives with: lives with their spouse Lives in: House/apartment Stairs: Yes: Internal: 20 steps; on right going up and External: 6 steps; on right going up Has following equipment at home: None  OCCUPATION: retired; travel, gardening, bee keeping  PLOF: Independent  PATIENT GOALS: to prevent falls  NEXT MD VISIT: none scheduled  OBJECTIVE:  Note: Objective measures were completed at Evaluation unless otherwise noted.  DIAGNOSTIC FINDINGS: N/A  PATIENT SURVEYS:   ABC scale 1230 / 1600 = 76.9 %  COGNITION: Overall cognitive status: Within functional limits for tasks assessed     SENSATION: WFL   POSTURE: rounded shoulders and forward head   LOWER EXTREMITY ROM: WFL for tasks assessed  LOWER EXTREMITY YQM:VHQIONG 5/5 except B hip ext 4/5 and B hip ABD 4+/5   FUNCTIONAL TESTS:  5 times sit to stand: 14.2 Timed up and go (TUG): 10.58 Berg Balance Scale: 56/56 however pt demonstrated LOB with quick 360 deg Dynamic Gait Index: 24/24 MCTSIB: Condition 1: Avg of 3 trials: 30 sec, Condition 2: Avg of 3 trials: 30 sec, Condition 3: Avg of 3 trials: 30 sec, Condition 4: Avg of 3 trials: 30 sec, and Total Score: 120/120  GAIT: Distance walked: 40 Assistive device  utilized: None Level of assistance: Complete Independence Comments: quick pace; no significant deviations   TODAY'S TREATMENT:                                                                                                                              [facility] Adult PT Treatment:                                                DATE: 02/27/2023 Therapeutic Exercise: Quarter wall squats x10 Pilates castanets Resisted thumb abd/ext --> demo individual digit resisted extension with rubber band Wall squats Thoracic extension seated --> supine over yoga mat roll (hands-on assist for cervical alignment) Double leg woodpecker Neuromuscular re-ed: Standing in corner with chair in front: Tandem balance + slow head turns/nods (10" holds) -> eyes open Romberg --> staggered stance + eyes closed --> added slow head turns/nods    DATE:   02/18/23 See pt ed and HEP  Standing corner balance in tandem stance with head turns  and vertical flex/ext - challenging  PATIENT EDUCATION:  Education details: Issued HEP  Person educated: Patient Education method: Explanation, Demonstration, and Handouts Education comprehension: verbalized understanding and returned demonstration  HOME EXERCISE  PROGRAM: Access Code: [email or web address] URL: [email or web address] Date: 02/27/2023 Prepared by: Carlynn Herald  Exercises - Semi-Tandem Corner Balance: Eyes Open With Head Turns  - 1 x daily - 7 x weekly - 3 sets - 10 reps - 10 sec hold - Woodpeckers Two Legs  - 1 x daily - 7 x weekly - 3 sets - 10 reps - Seated Long Arc Quad  - 1 x daily - 7 x weekly - 3 sets - 10 reps - Gastroc Stretch on Wall  - 1 x daily - 7 x weekly - 3 sets - 10 reps - Heel Toe Raises with Counter Support  - 1 x daily - 7 x weekly - 3 sets - 10 reps - Wall Quarter Squat  - 1 x daily - 7 x weekly - 3 sets - 10 reps - Thoracic Extension Mobilization on Foam Roll  - 1 x daily - 7 x weekly - 3 sets - 10 reps - Standing Hip Abduction with Counter Support  - 1 x daily - 7 x weekly - 3 sets - 10 reps - Standing Hip Extension with Counter Support  - 1 x daily - 7 x weekly - 3 sets - 10 reps  ASSESSMENT:  CLINICAL IMPRESSION: Balance exercises continued with progression to NBOS with eyes open and closed with slow head turns/nods. Initial postural sway improved with repetition of balance exercises. Thoracic and hand mobility exercises reviewed, providing tactile cues as needed. HEP issued with combination of balance and general strength/mobility exercises.  EVAL: Patient is a 77 y.o. male who was seen today for physical therapy evaluation and treatment  for balance problems. He has experienced one fall in the past 6 months when he turned around on uneven ground. His ABC score is 76.9% and he wants to prevent anymore falls from happening. He scored well on most of the balance tests (see objective), but does have difficulty when turning in a circle and narrow base of support activities on level ground and foam. He will benefit from skilled PT to address these deficits.  He is also interested in getting some general exercises for osteoarthritis especially for the neck, shoulders and hands. Plan is to see patient for just a few  visits to establish HEP.  OBJECTIVE IMPAIRMENTS: decreased balance and decreased strength.   ACTIVITY LIMITATIONS: bending and locomotion level  PARTICIPATION LIMITATIONS: yard work  PERSONAL FACTORS: Age are also affecting patient's functional outcome.   REHAB POTENTIAL: Excellent  CLINICAL DECISION MAKING: Stable/uncomplicated  EVALUATION COMPLEXITY: Low   GOALS: Goals reviewed with patient? Yes  SHORT TERM GOALS: Target date: 03/11/2023   Patient will be independent with initial HEP. Baseline:  Goal status: INITIAL   LONG TERM GOALS: Target date: 04/01/2023  Patient will be independent with advanced/ongoing HEP to improve outcomes and carryover.  Baseline:  Goal status: INITIAL  2.  Patient able to turn in a circle with no LOB Baseline:  Goal status: INITIAL  3. Patient to reports no falls during POC Baseline:  Goal status: INITIAL   PLAN:  PT FREQUENCY: 1x/week  PT DURATION: 6 weeks  PLANNED INTERVENTIONS: 97164- PT Re-evaluation, 97110-Therapeutic exercises, 97530- Therapeutic activity, [id number]- Neuromuscular re-education, 97535- Self Care, [id number]- Manual therapy, [phone]- Gait training, Patient/Family education, and Balance training  PLAN FOR NEXT SESSION: Review HEP. Progress balance activities with foam/narrow BOS for HEP, continue general ROM/flexibility exercises for neck/shoulders and hands.  Carlynn Herald, PTA 02/27/2023, 3:48 PM

## 2023-03-05 ENCOUNTER — Encounter: Payer: Self-pay | Admitting: Physical Therapy

## 2023-03-05 ENCOUNTER — Ambulatory Visit: Payer: Medicare Other | Admitting: Physical Therapy

## 2023-03-05 DIAGNOSIS — G8929 Other chronic pain: Secondary | ICD-10-CM

## 2023-03-05 DIAGNOSIS — R29898 Other symptoms and signs involving the musculoskeletal system: Secondary | ICD-10-CM

## 2023-03-05 DIAGNOSIS — R2681 Unsteadiness on feet: Secondary | ICD-10-CM | POA: Diagnosis not present

## 2023-03-05 NOTE — Therapy (Addendum)
OUTPATIENT PHYSICAL THERAPY LOWER EXTREMITY AND BALANCE TREATMENT AND DISCHARGE   Patient Name: Jonathon Taylor MRN: [id number] DOB:[birth year], 77 y.o., male Today's Date: 03/05/2023  END OF SESSION:  PT End of Session - 03/05/23 1055     Visit Number 3    Date for PT Re-Evaluation 04/15/23    Authorization Type MCR    Authorization - Visit Number 3    Progress Note Due on Visit 10    PT Start Time 1015    PT Stop Time 1057    PT Time Calculation (min) 42 min    Activity Tolerance Patient tolerated treatment well    Behavior During Therapy WFL for tasks assessed/performed              Past Medical History:  Diagnosis Date   Anemia    ON IRON FOR PRIOR ANEMIA R/T BLEEDING BEFORE TURBT    Basal cell carcinoma 05/23/1992   LEFT UPPER BACK NEAR SCAR   BCC (basal cell carcinoma of skin) 11/22/1993   BEHIND LEFT EAR tx EFUDEX   BCC (basal cell carcinoma of skin) 03/11/2003   RIGHT SHOULDER TX WITH BX   BCC (basal cell carcinoma of skin) 03/11/2003   LEFT SHOULDER TX WITH BX   BCC (basal cell carcinoma of skin) 11/24/2013   right forearm tx picato   BCC (basal cell carcinoma of skin) 04/19/2014   right shoulder inferior tx with bx   BCC (basal cell carcinoma of skin) 02/26/2018   right forearm tx cx3 37fu   BCC (basal cell carcinoma of skin)    GERD (gastroesophageal reflux disease)    H/O cardiac catheterization 2004   negative;Dr Viann Fish   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   Left Parotid Ear/Neck   Nodular basal cell carcinoma (BCC) 07/03/2021   Left Nasal Sidewall   Personal history of colonic polyps 08/12/2015   Prostate enlargement    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   left temple tx cx3 9fu   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   right shoulder front tx with bx   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   top right shoulder tx with bx   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   below left ear tx with bx   SCC (squamous  cell carcinoma) 03/03/2008   left temple tx efudex   SCC (squamous cell carcinoma) 11/14/2011   upper right arm tx with bx   SCC (squamous cell carcinoma) 11/24/2013   right sideburn picato cx3 42fu   SCC (squamous cell carcinoma) 02/26/2018   left upper arm cx3 35fu   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   Left Hand Psoterior (in situ)   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   Right Forearm Posterior (well diff)   SCCA (squamous cell carcinoma) of skin 08/16/2020   Left Upper Posterior Arm (well diff) treated after biopsy   SCCA (squamous cell carcinoma) of skin 01/16/2021   Left Hand - posterior (well diff)   SCCA (squamous cell carcinoma) of skin 07/03/2021   Mid Tip of Nose (well diff with superficial inflitration)   Seasonal allergies    Skin cancer    Dr Janalyn Harder   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 " per patient , after changing diet , no reccurrence of symptoms    COLONOSCOPY     INGUINAL HERNIA REPAIR Bilateral 03/12/2017   Procedure: LAPAROSCOPIC BILATERAL INGUINAL HERNIA WITH  MESH;  Surgeon: Berna Bue, MD;  Location: [facility];  Service: General;  Laterality: Bilateral;   INSERTION OF MESH N/A 03/12/2017   Procedure: INSERTION OF MESH;  Surgeon: Berna Bue, MD;  Location: [facility];  Service: General;  Laterality: N/A;   TRANSURETHRAL RESECTION OF BLADDER TUMOR N/A 07/08/2017   Procedure: CYSTO CLOT EVACUATION AND FULGRATION;  Surgeon: Bjorn Pippin, MD;  Location: [facility];  Service: Urology;  Laterality: N/A;   XI ROBOTIC ASSISTED SIMPLE PROSTATECTOMY N/A 09/19/2017    Procedure: XI ROBOTIC ASSISTED SIMPLE PROSTATECTOMY;  Surgeon: Malen Gauze, MD;  Location: [facility];  Service: Urology;  Laterality: N/A;   Patient Active Problem List   Diagnosis Date Noted   Balance problems 02/12/2023   Family history of arrhythmia 11/01/2022   COVID-19 03/31/2021   Chronic pain of both knees 09/11/2020   Encounter for Medicare annual wellness exam 09/01/2019   Elevated LDL cholesterol level 10/17/2018   Low calcium levels 04/18/2018   Spondylolisthesis 08/29/2015   History of colonic polyps 08/12/2015   Insomnia 01/18/2014   RAD (reactive airway disease) 11/26/2012   Family history of cardiovascular disease 11/26/2012    PCP: Everrett Coombe, DO   REFERRING PROVIDER: Novella Olive, FNP   REFERRING DIAG: R26.89 (ICD-10-CM) - Balance problems   THERAPY DIAG:  Unsteadiness on feet  Chronic pain of right knee  Chronic pain of left knee  Other symptoms and signs involving the musculoskeletal system  Rationale for Evaluation and Treatment: Rehabilitation  ONSET DATE: Last MD appt  SUBJECTIVE:   SUBJECTIVE STATEMENT: Pt states he is doing well. Has been performing HEP  PERTINENT HISTORY: Spondylolisthesis in lumbar spine PAIN:  Are you having pain? No  PRECAUTIONS: None  RED FLAGS: None   WEIGHT BEARING RESTRICTIONS: No  FALLS:  Has patient fallen in last 6 months? Yes. Number of falls 1 turned around in the garden and fell into a pile of leaves  LIVING ENVIRONMENT: Lives with: lives with their spouse Lives in: House/apartment Stairs: Yes: Internal: 20 steps; on right going up and External: 6 steps; on right going up Has following equipment at home: None  OCCUPATION: retired; travel, gardening, bee keeping  PLOF: Independent  PATIENT GOALS: to prevent falls  NEXT MD VISIT: none scheduled  OBJECTIVE:  Note: Objective measures were completed at Evaluation unless otherwise noted.  DIAGNOSTIC FINDINGS: N/A  PATIENT  SURVEYS:  ABC scale 1230 / 1600 = 76.9 %   POSTURE: rounded shoulders and forward head   LOWER EXTREMITY ROM: WFL for tasks assessed  LOWER EXTREMITY ZOX:WRUEAVW 5/5 except B hip ext 4/5 and B hip ABD 4+/5   FUNCTIONAL TESTS:  5 times sit to stand: 14.2 Timed up and go (TUG): 10.58 Berg Balance Scale: 56/56 however pt demonstrated LOB with quick 360 deg Dynamic Gait Index: 24/24 MCTSIB: Condition 1: Avg of 3 trials: 30 sec, Condition 2: Avg of 3 trials: 30 sec, Condition 3: Avg of 3 trials: 30 sec, Condition 4: Avg of 3 trials: 30 sec, and Total Score: 120/120  GAIT: Distance walked: 40 Assistive device  utilized: None Level of assistance: Complete Independence Comments: quick pace; no significant deviations   TODAY'S TREATMENT:                                                                                                                              [facility] Adult PT Treatment:                                                DATE: 03/05/23 Therapeutic Exercise: Review HEP with rationale for treatment Wall quarter squats 3 x 20 seconds Gastroc stretch Quadruped thread the needle x 10 bilat  Neuromuscular re-ed: 180 degree quick turns 360 degree quick turns Rocker board lateral and A/P with perturbations Tandem stance 15#KB swings 2 x 30 sec bilat Tandem stance 15# KB circles Rebounder yellow med ball toss tandem bilat and SLS bilat   [facility] Adult PT Treatment:                                                DATE: 02/27/2023 Therapeutic Exercise: Quarter wall squats x10 Pilates castanets Resisted thumb abd/ext --> demo individual digit resisted extension with rubber band Wall squats Thoracic extension seated --> supine over yoga mat roll (hands-on assist for cervical alignment) Double leg woodpecker Neuromuscular re-ed: Standing in corner with chair in front: Tandem balance + slow head turns/nods (10" holds) -> eyes open Romberg --> staggered stance + eyes closed --> added  slow head turns/nods    PATIENT EDUCATION:  Education details: Issued HEP  Person educated: Patient Education method: Explanation, Demonstration, and Handouts Education comprehension: verbalized understanding and returned demonstration  HOME EXERCISE PROGRAM: Access Code: [email or web address] URL: [email or web address] Date: 03/05/2023 Prepared by: Reggy Eye  Exercises - Semi-Tandem Corner Balance: Eyes Open With Head Turns  - 1 x daily - 7 x weekly - 3 sets - 10 reps - 10 sec hold - Woodpeckers Two Legs  - 1 x daily - 7 x weekly - 3 sets - 10 reps - Seated Long Arc Quad  - 1 x daily - 7 x weekly - 3 sets - 10 reps - Gastroc Stretch on Wall  - 1 x daily - 7 x weekly - 3 sets - 10 reps - Heel Toe Raises with Counter Support  - 1 x daily - 7 x weekly - 3 sets - 10 reps - Wall Quarter Squat  - 1 x daily - 7 x weekly - 3 sets - 10 reps - Thoracic Extension Mobilization on Foam Roll  - 1 x daily - 7 x weekly - 3 sets - 10 reps - Standing Hip Abduction with Counter Support  - 1 x daily - 7 x weekly - 3  sets - 10 reps - Standing Hip Extension with Counter Support  - 1 x daily - 7 x weekly - 3 sets - 10 reps - Turning in Corner 360  - 1 x daily - 7 x weekly - 1 sets - 10 reps - Standing Half Turn with Support  - 1 x daily - 7 x weekly - 1 sets - 10 reps - Quadruped Full Range Thoracic Rotation with Reach  - 1 x daily - 7 x weekly - 3 sets - 10 reps  ASSESSMENT:  CLINICAL IMPRESSION: Continued to progress balance by adding perturbations. Pt with good tolerance to additional exercises. Added 180 and 360 turns to HEP.  EVAL: Patient is a 77 y.o. male who was seen today for physical therapy evaluation and treatment for balance problems. He has experienced one fall in the past 6 months when he turned around on uneven ground. His ABC score is 76.9% and he wants to prevent anymore falls from happening. He scored well on most of the balance tests (see objective), but does have difficulty  when turning in a circle and narrow base of support activities on level ground and foam. He will benefit from skilled PT to address these deficits.  He is also interested in getting some general exercises for osteoarthritis especially for the neck, shoulders and hands. Plan is to see patient for just a few visits to establish HEP.  OBJECTIVE IMPAIRMENTS: decreased balance and decreased strength.    GOALS: Goals reviewed with patient? Yes  SHORT TERM GOALS: Target date: 03/11/2023   Patient will be independent with initial HEP. Baseline:  Goal status: INITIAL   LONG TERM GOALS: Target date: 04/01/2023  Patient will be independent with advanced/ongoing HEP to improve outcomes and carryover.  Baseline:  Goal status: INITIAL  2.  Patient able to turn in a circle with no LOB Baseline:  Goal status: INITIAL  3. Patient to reports no falls during POC Baseline:  Goal status: INITIAL   PLAN:  PT FREQUENCY: 1x/week  PT DURATION: 6 weeks  PLANNED INTERVENTIONS: 97164- PT Re-evaluation, 97110-Therapeutic exercises, 97530- Therapeutic activity, 97112- Neuromuscular re-education, 97535- Self Care, 16109- Manual therapy, [phone]- Gait training, Patient/Family education, and Balance training  PLAN FOR NEXT SESSION:  UPDATE HEP, Progress balance activities with foam/narrow BOS for HEP, add dynamic balance, continue general ROM/flexibility exercises for neck/shoulders and hands.  PHYSICAL THERAPY DISCHARGE SUMMARY  Visits from Start of Care: 3  Current functional level related to goals / functional outcomes: Improving balance reactions   Remaining deficits: See above   Education / Equipment: HEP   Patient agrees to discharge. Patient goals were not met. Patient is being discharged due to not returning since the last visit.  Reggy Eye, PT,DPT01/21/2510:34 AM    Reggy Eye, PT,DPT12/12/2408:56 AM

## 2023-06-24 ENCOUNTER — Other Ambulatory Visit: Payer: Self-pay | Admitting: Family Medicine

## 2023-06-26 ENCOUNTER — Other Ambulatory Visit: Payer: Self-pay

## 2023-06-27 ENCOUNTER — Other Ambulatory Visit: Payer: Self-pay | Admitting: Family Medicine

## 2023-11-26 ENCOUNTER — Encounter: Payer: Self-pay | Admitting: Sports Medicine

## 2024-04-17 LAB — OPHTHALMOLOGY REPORT-SCANNED
